# Patient Record
Sex: Male | Born: 1955 | Race: White | Hispanic: No | Smoking: Never smoker
Health system: Southern US, Community
[De-identification: ages and names within clinical notes are randomized; demographics above are authoritative.]

## PROBLEM LIST (undated history)

## (undated) DIAGNOSIS — E119 Type 2 diabetes mellitus without complications: Secondary | ICD-10-CM

## (undated) DIAGNOSIS — H35039 Hypertensive retinopathy, unspecified eye: Secondary | ICD-10-CM

## (undated) DIAGNOSIS — E11319 Type 2 diabetes mellitus with unspecified diabetic retinopathy without macular edema: Secondary | ICD-10-CM

## (undated) DIAGNOSIS — I10 Essential (primary) hypertension: Secondary | ICD-10-CM

## (undated) HISTORY — DX: Type 2 diabetes mellitus with unspecified diabetic retinopathy without macular edema: E11.319

## (undated) HISTORY — PX: CATARACT EXTRACTION: SUR2

## (undated) HISTORY — DX: Essential (primary) hypertension: I10

## (undated) HISTORY — PX: EYE SURGERY: SHX253

## (undated) HISTORY — DX: Type 2 diabetes mellitus without complications: E11.9

## (undated) HISTORY — DX: Hypertensive retinopathy, unspecified eye: H35.039

---

## 2000-10-21 ENCOUNTER — Encounter: Admission: RE | Admit: 2000-10-21 | Discharge: 2000-10-21 | Payer: Self-pay | Admitting: Family Medicine

## 2000-10-21 ENCOUNTER — Encounter: Payer: Self-pay | Admitting: Family Medicine

## 2005-09-19 ENCOUNTER — Emergency Department (HOSPITAL_COMMUNITY): Admission: EM | Admit: 2005-09-19 | Discharge: 2005-09-20 | Payer: Self-pay | Admitting: Emergency Medicine
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
## 2017-07-02 ENCOUNTER — Encounter: Payer: Self-pay | Admitting: "Endocrinology

## 2017-07-09 ENCOUNTER — Encounter: Payer: Self-pay | Admitting: "Endocrinology

## 2017-07-11 ENCOUNTER — Encounter: Payer: Self-pay | Admitting: "Endocrinology

## 2017-07-16 ENCOUNTER — Encounter: Payer: Self-pay | Admitting: "Endocrinology

## 2017-07-22 ENCOUNTER — Encounter: Payer: Self-pay | Admitting: "Endocrinology

## 2017-09-17 ENCOUNTER — Ambulatory Visit: Payer: Self-pay | Admitting: "Endocrinology

## 2017-10-02 LAB — CBC AND DIFFERENTIAL
HCT: 41 (ref 41–53)
Hemoglobin: 13.5 (ref 13.5–17.5)

## 2017-10-02 LAB — LIPID PANEL
Cholesterol: 198 (ref 0–200)
HDL: 35 (ref 35–70)
LDL Cholesterol: 114
TRIGLYCERIDES: 247 — AB (ref 40–160)

## 2017-10-02 LAB — HEMOGLOBIN A1C: Hemoglobin A1C: 13.8

## 2017-10-02 LAB — BASIC METABOLIC PANEL
BUN: 12 (ref 4–21)
CREATININE: 1.3 (ref <=1.3)

## 2017-10-02 LAB — TSH: TSH: 1.59 (ref <=5.90)

## 2017-10-02 LAB — VITAMIN D 25 HYDROXY (VIT D DEFICIENCY, FRACTURES): VIT D 25 HYDROXY: 32

## 2017-10-20 ENCOUNTER — Encounter: Payer: Self-pay | Admitting: "Endocrinology

## 2017-10-20 ENCOUNTER — Ambulatory Visit (INDEPENDENT_AMBULATORY_CARE_PROVIDER_SITE_OTHER): Payer: Medicare HMO | Admitting: "Endocrinology

## 2017-10-20 VITALS — BP 108/60 | HR 88 | Ht 73.0 in | Wt 185.8 lb

## 2017-10-20 DIAGNOSIS — I10 Essential (primary) hypertension: Secondary | ICD-10-CM | POA: Diagnosis not present

## 2017-10-20 DIAGNOSIS — E1165 Type 2 diabetes mellitus with hyperglycemia: Secondary | ICD-10-CM

## 2017-10-20 MED ORDER — ACCU-CHEK GUIDE W/DEVICE KIT: 1.0000 | PACK | 0 refills | Status: DC

## 2017-10-20 MED ORDER — GLUCOSE BLOOD VI STRP: ORAL_STRIP | 2 refills | Status: DC

## 2017-10-20 NOTE — Patient Instructions (Signed)

## 2017-10-20 NOTE — Progress Notes (Signed)
Endocrinology Consult Note       10/20/2017, 5:40 PM   Subjective:    Patient ID: Thomas Finley, male    DOB: Apr 04, 1955.  Thomas Finley is being seen in consultation for management of currently uncontrolled symptomatic diabetes requested by  Kennieth Rad, MD.   Past Medical History:  Diagnosis Date  . Diabetes ([place])   . Hypertension    History reviewed. No pertinent surgical history. Social History   Socioeconomic History  . Marital status: Single    Spouse name: Not on file  . Number of children: Not on file  . Years of education: Not on file  . Highest education level: Not on file  Occupational History  . Not on file  Social Needs  . Financial resource strain: Not on file  . Food insecurity:    Worry: Not on file    Inability: Not on file  . Transportation needs:    Medical: Not on file    Non-medical: Not on file  Tobacco Use  . Smoking status: Never Smoker  . Smokeless tobacco: Never Used  Substance and Sexual Activity  . Alcohol use: Not on file  . Drug use: Not on file  . Sexual activity: Not on file  Lifestyle  . Physical activity:    Days per week: Not on file    Minutes per session: Not on file  . Stress: Not on file  Relationships  . Social connections:    Talks on phone: Not on file    Gets together: Not on file    Attends religious service: Not on file    Active member of club or organization: Not on file    Attends meetings of clubs or organizations: Not on file    Relationship status: Not on file  Other Topics Concern  . Not on file  Social History Narrative  . Not on file   Outpatient Encounter Medications as of 10/20/2017  Medication Sig  . glipiZIDE (GLUCOTROL) 10 MG tablet Take 5 mg by mouth 2 (two) times daily with a meal.  . lisinopril (PRINIVIL,ZESTRIL) 2.5 MG tablet Take 2.5 mg by mouth daily.  . [DISCONTINUED] insulin lispro (HUMALOG) 100 UNIT/ML  injection Inject 25 Units into the skin daily.  . Blood Glucose Monitoring Suppl (ACCU-CHEK GUIDE) w/Device KIT 1 Piece by Does not apply route as directed.  Marland Kitchen glucose blood (ACCU-CHEK GUIDE) test strip Use as instructed   No facility-administered encounter medications on file as of 10/20/2017.     ALLERGIES: No Known Allergies  VACCINATION STATUS:  There is no immunization history on file for this patient.  Diabetes  He presents for his initial diabetic visit. He has type 2 diabetes mellitus. Onset time: He was diagnosed at approximate age of 62 years. His disease course has been worsening. There are no hypoglycemic associated symptoms. Pertinent negatives for hypoglycemia include no confusion, headaches, pallor or seizures. Associated symptoms include blurred vision, polydipsia, polyuria and weight loss. Pertinent negatives for diabetes include no chest pain, no fatigue, no polyphagia and no weakness. There are no hypoglycemic complications. Symptoms are worsening. Risk factors for coronary artery disease include  dyslipidemia, family history, diabetes mellitus, male sex and sedentary lifestyle. Current diabetic treatment includes insulin injections (He is getting glipizide 10 mg p.o. twice daily, Humalog 25 units nightly.). His weight is decreasing steadily. He is following a generally unhealthy diet. When asked about meal planning, he reported none. He has had a previous visit with a dietitian. He never participates in exercise. (He did not bring any meter no logs to review today.  He reports verbally that his recent labs show A1c of "high ".) An ACE inhibitor/angiotensin II receptor blocker is being taken.  Hypertension  This is a chronic problem. The problem is controlled. Associated symptoms include blurred vision. Pertinent negatives include no chest pain, headaches, neck pain, palpitations or shortness of breath. Risk factors for coronary artery disease include diabetes mellitus,  dyslipidemia, male gender and sedentary lifestyle. Past treatments include ACE inhibitors.  Hyperlipidemia  This is a chronic problem. Exacerbating diseases include diabetes. Pertinent negatives include no chest pain, myalgias or shortness of breath. Current antihyperlipidemic treatment includes statins. Risk factors for coronary artery disease include dyslipidemia, diabetes mellitus, hypertension, male sex and a sedentary lifestyle.      Review of Systems  Constitutional: Positive for weight loss. Negative for chills, fatigue, fever and unexpected weight change.  HENT: Negative for dental problem, mouth sores and trouble swallowing.   Eyes: Positive for blurred vision. Negative for visual disturbance.  Respiratory: Negative for cough, choking, chest tightness, shortness of breath and wheezing.   Cardiovascular: Negative for chest pain, palpitations and leg swelling.  Gastrointestinal: Negative for abdominal distention, abdominal pain, constipation, diarrhea, nausea and vomiting.  Endocrine: Positive for polydipsia and polyuria. Negative for polyphagia.  Genitourinary: Negative for dysuria, flank pain, hematuria and urgency.  Musculoskeletal: Negative for back pain, gait problem, myalgias and neck pain.  Skin: Negative for pallor, rash and wound.  Neurological: Negative for seizures, syncope, weakness, numbness and headaches.  Psychiatric/Behavioral: Negative for confusion and dysphoric mood.    Objective:    BP 108/60 (BP Location: Left Arm, Patient Position: Sitting)   Pulse 88   Ht 6' 1"  (1.854 m)   Wt 185 lb 12.8 oz (84.3 kg)   SpO2 98%   BMI 24.51 kg/m   Wt Readings from Last 3 Encounters:  10/20/17 185 lb 12.8 oz (84.3 kg)     Physical Exam  Constitutional: He is oriented to person, place, and time. He appears well-developed. He is cooperative. No distress.  Disheveled, poor  Hygiene generally  HENT:  Head: Normocephalic and atraumatic.  Eyes: EOM are normal.  Neck:  Normal range of motion. Neck supple. No tracheal deviation present. No thyromegaly present.  Cardiovascular: Normal rate, S1 normal, S2 normal and normal heart sounds. Exam reveals no gallop.  No murmur heard. Pulses:      Dorsalis pedis pulses are 1+ on the right side, and 1+ on the left side.       Posterior tibial pulses are 1+ on the right side, and 1+ on the left side.  Pulmonary/Chest: Breath sounds normal. No respiratory distress. He has no wheezes.  Abdominal: Soft. Bowel sounds are normal. He exhibits no distension. There is no tenderness. There is no guarding and no CVA tenderness.  Musculoskeletal: He exhibits no edema.       Right shoulder: He exhibits no swelling and no deformity.  Neurological: He is alert and oriented to person, place, and time. He has normal strength and normal reflexes. No cranial nerve deficit or sensory deficit. Gait normal.  Skin:  Skin is warm and dry. No rash noted. No cyanosis. Nails show no clubbing.  Psychiatric: His speech is normal. Judgment normal. Cognition and memory are normal.    Assessment & Plan:   1. Uncontrolled type 2 diabetes mellitus with hyperglycemia ([facility])  - Thomas Finley has currently uncontrolled symptomatic type  (?) DM since 62 years of age. -Patient came with no meter nor logs.  His recent labs are not reported, patient says he was told his A1c was " high". 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 These are all discussed in detail with the patient.  - I have counseled him on diet management by adopting a carbohydrate restricted/protein rich diet.  - Suggestion is made for him to avoid simple carbohydrates  from his diet including Cakes, Sweet Desserts, Ice Cream, Soda (diet and regular), Sweet Tea, Candies, Chips, Cookies, Store Bought Juices, Alcohol in Excess of   1-2 drinks a day, Artificial Sweeteners, and "Sugar-free" Products. This will help patient to have stable blood glucose profile and potentially avoid unintended weight gain.  - I encouraged him to switch to  unprocessed or minimally processed complex starch and increased protein intake (animal or plant source), fruits, and vegetables.  - he is advised to stick to a routine mealtimes to eat 3 meals  a day and avoid unnecessary snacks ( to snack only to correct hypoglycemia).   -He explains that he has met with various dietitian within the past and he would not consider another referral this time.  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   -#1 priority in managing his diabetes is to avoid hypoglycemia, especially inadvertent hypoglycemia without proper monitoring.   -I approached him to start monitoring blood glucose 4 times a day-before meals and at bedtime and return in 1 week with his meter and logs for reevaluation.  A new prescription for a meter and strips will be sent to his pharmacy. -Patient is encouraged to call clinic for blood glucose levels less than 70 or above 300 mg /dl. -We will request for his recent labs from Dr. Shearon Stalls office.  His A1c is high and returns with significant hyperglycemia, he will be considered for insulin treatment. -If he requires insulin treatment, he will be considered for basal insulin first before rapid acting insulin.  Hence, I advised him to hold his Humalog for now. -I advised him to lower his glipizide to 5 mg p.o. twice daily-with breakfast and supper.   -He is not a suitable candidate for incretin therapy, SGLT2 inhibitors therapy, nor metformin therapy.  - Patient specific target  A1c;  LDL, HDL, Triglycerides, and  Waist Circumference were discussed in detail.  2) BP/HTN: His blood pressure is controlled to  target.  He is advised to continue low-dose lisinopril at 2.5 mg p.o. daily.    3) Lipids/HPL:   His recent lipid panel not reported.  He is advised to continue atorvastatin 10 mg p.o. nightly.   4)  Weight/Diet: He declined another CDE referral, exercise, and detailed carbohydrates information provided.  If he loses weight unintentionally, he will be considered for Creon therapy.  5) Chronic Care/Health Maintenance:  -he  Is on ACEI/ARB and Statin medications and  is encouraged  to initiate and continue to follow up with Ophthalmology, Dentist,  Podiatrist at least yearly or according to recommendations, and advised to  stay away from smoking alcohol. I have recommended yearly flu vaccine and pneumonia vaccine at least every 5 years; moderate intensity exercise for up to 150 minutes weekly; and  sleep for at least 7 hours a day.  - I advised patient to maintain close follow up with Kennieth Rad, MD for primary care needs.  - Time spent with the patient: 45 minutes, of which >50% was spent in obtaining information about his symptoms, reviewing his previous labs, evaluations, and treatments, counseling him about his uncontrolled diabetes, hypertension, hyperlipidemia, and developing developing  plans for long term treatment based on the latest recommendations.  Thomas Finley participated in the discussions, expressed understanding, and voiced agreement with the above plans.  All questions were answered to his satisfaction. he is encouraged to contact clinic should he have any questions or concerns prior to his return visit.  Follow up plan: - Return in about 1 week (around 10/27/2017), or latest labs from Dr. Shearon Stalls office, for meter, and logs, follow up with pre-visit labs, meter, and logs.  Glade Lloyd, MD [facility] [address] [place], [place] [address] Phone: [phone]  Fax: [phone]    10/20/2017, 5:40 PM  This note was  partially dictated with voice recognition software. Similar sounding words can be transcribed inadequately or may not  be corrected upon review.

## 2017-10-28 ENCOUNTER — Encounter: Payer: Self-pay | Admitting: "Endocrinology

## 2017-10-28 ENCOUNTER — Ambulatory Visit (INDEPENDENT_AMBULATORY_CARE_PROVIDER_SITE_OTHER): Payer: Medicare HMO | Admitting: "Endocrinology

## 2017-10-28 VITALS — BP 126/85 | HR 96 | Ht 73.0 in | Wt 190.0 lb

## 2017-10-28 DIAGNOSIS — N183 Chronic kidney disease, stage 3 (moderate): Secondary | ICD-10-CM

## 2017-10-28 DIAGNOSIS — Z9119 Patient's noncompliance with other medical treatment and regimen: Secondary | ICD-10-CM

## 2017-10-28 DIAGNOSIS — Z91199 Patient's noncompliance with other medical treatment and regimen due to unspecified reason: Secondary | ICD-10-CM | POA: Insufficient documentation

## 2017-10-28 DIAGNOSIS — I10 Essential (primary) hypertension: Secondary | ICD-10-CM

## 2017-10-28 DIAGNOSIS — E1122 Type 2 diabetes mellitus with diabetic chronic kidney disease: Secondary | ICD-10-CM | POA: Diagnosis not present

## 2017-10-28 MED ORDER — LINAGLIPTIN 5 MG PO TABS: 5.0000 mg | ORAL_TABLET | Freq: Every day | ORAL | 3 refills | Status: DC

## 2017-10-28 NOTE — Patient Instructions (Signed)

## 2017-10-28 NOTE — Progress Notes (Signed)
Endocrinology follow-up  Note       10/28/2017, 1:26 PM   Subjective:    Patient ID: Thomas Finley, male    DOB: 18-Jul-1955.  Thomas Finley is being seen in follow-up for management of currently uncontrolled symptomatic diabetes requested by  Thomas Rad, MD.   Past Medical History:  Diagnosis Date  . Diabetes ([place])   . Hypertension    History reviewed. No pertinent surgical history. Social History   Socioeconomic History  . Marital status: Single    Spouse name: Not on file  . Number of children: Not on file  . Years of education: Not on file  . Highest education level: Not on file  Occupational History  . Not on file  Social Needs  . Financial resource strain: Not on file  . Food insecurity:    Worry: Not on file    Inability: Not on file  . Transportation needs:    Medical: Not on file    Non-medical: Not on file  Tobacco Use  . Smoking status: Never Smoker  . Smokeless tobacco: Never Used  Substance and Sexual Activity  . Alcohol use: Not on file  . Drug use: Not on file  . Sexual activity: Not on file  Lifestyle  . Physical activity:    Days per week: Not on file    Minutes per session: Not on file  . Stress: Not on file  Relationships  . Social connections:    Talks on phone: Not on file    Gets together: Not on file    Attends religious service: Not on file    Active member of club or organization: Not on file    Attends meetings of clubs or organizations: Not on file    Relationship status: Not on file  Other Topics Concern  . Not on file  Social History Narrative  . Not on file   Outpatient Encounter Medications as of 10/28/2017  Medication Sig  . atorvastatin (LIPITOR) 10 MG tablet Take 10 mg by mouth daily.  . Blood Glucose Monitoring Suppl (ACCU-CHEK GUIDE) w/Device KIT 1 Piece by Does not apply route as directed.  Marland Kitchen glipiZIDE (GLUCOTROL) 10 MG tablet Take 5 mg by  mouth 2 (two) times daily with a meal.  . glucose blood (ACCU-CHEK GUIDE) test strip Use as instructed  . linagliptin (TRADJENTA) 5 MG TABS tablet Take 1 tablet (5 mg total) by mouth daily.  Marland Kitchen lisinopril (PRINIVIL,ZESTRIL) 2.5 MG tablet Take 2.5 mg by mouth daily.   No facility-administered encounter medications on file as of 10/28/2017.     ALLERGIES: No Known Allergies  VACCINATION STATUS:  There is no immunization history on file for this patient.  Diabetes  He presents for his follow-up diabetic visit. He has type 2 diabetes mellitus. Onset time: He was diagnosed at approximate age of 38 years. His disease course has been worsening. There are no hypoglycemic associated symptoms. Pertinent negatives for hypoglycemia include no confusion, headaches, pallor or seizures. Associated symptoms include blurred vision, polydipsia, polyuria and weight loss. Pertinent negatives for diabetes include no chest pain, no fatigue, no polyphagia and no weakness.  There are no hypoglycemic complications. Symptoms are worsening. Risk factors for coronary artery disease include dyslipidemia, family history, diabetes mellitus, male sex and sedentary lifestyle. Current diabetic treatment includes insulin injections (He is getting glipizide 10 mg p.o. twice daily, Humalog 25 units nightly.). His weight is increasing steadily. He is following a generally unhealthy diet. When asked about meal planning, he reported none. He has had a previous visit with a dietitian. He never participates in exercise. (He returns with no meter nor logs.  He does not even go to the pharmacy to get his supplies.  He is A1c from his PMD office is 13.8%.   ) An ACE inhibitor/angiotensin II receptor blocker is being taken.  Hypertension  This is a chronic problem. The problem is controlled. Associated symptoms include blurred vision. Pertinent negatives include no chest pain, headaches, neck pain, palpitations or shortness of breath. Risk  factors for coronary artery disease include diabetes mellitus, dyslipidemia, male gender and sedentary lifestyle. Past treatments include ACE inhibitors.  Hyperlipidemia  This is a chronic problem. Exacerbating diseases include diabetes. Pertinent negatives include no chest pain, myalgias or shortness of breath. Current antihyperlipidemic treatment includes statins. Risk factors for coronary artery disease include dyslipidemia, diabetes mellitus, hypertension, male sex and a sedentary lifestyle.      Review of Systems  Constitutional: Positive for weight loss. Negative for chills, fatigue, fever and unexpected weight change.  HENT: Negative for dental problem, mouth sores and trouble swallowing.   Eyes: Positive for blurred vision. Negative for visual disturbance.  Respiratory: Negative for cough, choking, chest tightness, shortness of breath and wheezing.   Cardiovascular: Negative for chest pain, palpitations and leg swelling.  Gastrointestinal: Negative for abdominal distention, abdominal pain, constipation, diarrhea, nausea and vomiting.  Endocrine: Positive for polydipsia and polyuria. Negative for polyphagia.  Genitourinary: Negative for dysuria, flank pain, hematuria and urgency.  Musculoskeletal: Negative for back pain, gait problem, myalgias and neck pain.  Skin: Negative for pallor, rash and wound.  Neurological: Negative for seizures, syncope, weakness, numbness and headaches.  Psychiatric/Behavioral: Negative for confusion and dysphoric mood.    Objective:    BP 126/85   Pulse 96   Ht 6' 1"  (1.854 m)   Wt 190 lb (86.2 kg)   BMI 25.07 kg/m   Wt Readings from Last 3 Encounters:  10/28/17 190 lb (86.2 kg)  10/20/17 185 lb 12.8 oz (84.3 kg)     Physical Exam  Constitutional: He is oriented to person, place, and time. He appears well-developed. He is cooperative. No distress.  Disheveled, poor  Hygiene generally  HENT:  Head: Normocephalic and atraumatic.  Eyes: EOM are  normal.  Neck: Normal range of motion. Neck supple. No tracheal deviation present. No thyromegaly present.  Cardiovascular: Normal rate, S1 normal, S2 normal and normal heart sounds. Exam reveals no gallop.  No murmur heard. Pulses:      Dorsalis pedis pulses are 1+ on the right side, and 1+ on the left side.       Posterior tibial pulses are 1+ on the right side, and 1+ on the left side.  Pulmonary/Chest: Breath sounds normal. No respiratory distress. He has no wheezes.  Abdominal: Soft. Bowel sounds are normal. He exhibits no distension. There is no tenderness. There is no guarding and no CVA tenderness.  Musculoskeletal: He exhibits no edema.       Right shoulder: He exhibits no swelling and no deformity.  Neurological: He is alert and oriented to person, place, and time. He  has normal strength and normal reflexes. No cranial nerve deficit or sensory deficit. Gait normal.  Skin: Skin is warm and dry. No rash noted. No cyanosis. Nails show no clubbing.  Psychiatric: His speech is normal. Judgment normal. Cognition and memory are normal.  Disheveled, reluctant and unconcerned affect.   Recent Results (from the past 2160 hour(s))  CBC and differential     Status: None   Collection Time: 10/02/17 12:00 AM  Result Value Ref Range   Hemoglobin 13.5 13.5 - 17.5   HCT 41 41 - 53  VITAMIN D 25 Hydroxy (Vit-D Deficiency, Fractures)     Status: None   Collection Time: 10/02/17 12:00 AM  Result Value Ref Range   Vit D, 25-Hydroxy 32   Basic metabolic panel     Status: None   Collection Time: 10/02/17 12:00 AM  Result Value Ref Range   BUN 12 4 - 21   Creatinine 1.3 0.6 - 1.3  Lipid panel     Status: Abnormal   Collection Time: 10/02/17 12:00 AM  Result Value Ref Range   Triglycerides 247 (A) 40 - 160   Cholesterol 198 0 - 200   HDL 35 35 - 70   LDL Cholesterol 114   Hemoglobin A1c     Status: None   Collection Time: 10/02/17 12:00 AM  Result Value Ref Range   Hemoglobin A1C 13.8    TSH     Status: None   Collection Time: 10/02/17 12:00 AM  Result Value Ref Range   TSH 1.59 0.41 - 5.90     Assessment & Plan:   1. Uncontrolled type 2 diabetes mellitus with hyperglycemia (HCC)  - Armistead Sult has currently uncontrolled symptomatic type  (?) DM since 61 years of age. -He returns with no meter nor logs.  His labs from July 11 show A1c of 13.8% , patient is minimally symptomatic. -his diabetes is complicated by past heavy alcohol use, sedentary life and Brentton Wardlow remains at a high risk for more acute and chronic complications which include CAD, CVA, CKD, retinopathy, and neuropathy. These are all discussed in detail with the patient.  - I have counseled him on diet management by adopting a carbohydrate restricted/protein rich diet.  -  Suggestion is made for him to avoid simple carbohydrates  from his diet including Cakes, Sweet Desserts / Pastries, Ice Cream, Soda (diet and regular), Sweet Tea, Candies, Chips, Cookies, Store Bought Juices, Alcohol in Excess of  1-2 drinks a day, Artificial Sweeteners, and "Sugar-free" Products. This will help patient to have stable blood glucose profile and potentially avoid unintended weight gain.   - I encouraged him to switch to  unprocessed or minimally processed complex starch and increased protein intake (animal or plant source), fruits, and vegetables.  - he is advised to stick to a routine mealtimes to eat 3 meals  a day and avoid unnecessary snacks ( to snack only to correct hypoglycemia).   -He explains that he has met with various dietitian within the past and he would not consider another referral this time.  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   -#1 priority in managing his diabetes is to avoid hypoglycemia, especially inadvertent hypoglycemia without proper monitoring.   He is not committed  for safe use of insulin at this time. -Once his commitment to monitor is assured, he  will be considered for insulin treatment.   -Once again, I approached him to start monitoring blood glucose 2 times a day-before breakfast and at bedtime and return in 9 weeks with his meter and logs.    -In the meantime I advised him to continue glipizide 5 mg with breakfast and 5 mg with supper.  I discussed and added Tradjenta 5 mg p.o. daily with breakfast.    -He is encouraged to call clinic for blood glucose levels less than 70 or above 300 mg /dl.  -I advised him to lower his glipizide to 5 mg p.o. twice daily-with breakfast and supper.   -He is not a suitable candidate for incretin therapy, SGLT2 inhibitors therapy, nor metformin therapy.  - Patient specific target  A1c;  LDL, HDL, Triglycerides, and  Waist Circumference were discussed in detail.  2) BP/HTN: His blood pressure is controlled to target.    He is advised to continue low-dose lisinopril at 2.5 mg p.o. daily.    3) Lipids/HPL:   His recent lipid panel not reported.  He is advised to continue atorvastatin 10 mg p.o. nightly.   4)  Weight/Diet: He declined another CDE referral, exercise, and detailed carbohydrates information provided.  If he loses weight unintentionally, he will be considered for Creon therapy.  5) Chronic Care/Health Maintenance:  -he  Is on ACEI/ARB and Statin medications and  is encouraged to initiate and continue to follow up with Ophthalmology, Dentist,  Podiatrist at least yearly or according to recommendations, and advised to  stay away from smoking alcohol. I have recommended yearly flu vaccine and pneumonia vaccine at least every 5 years; moderate intensity exercise for up to 150 minutes weekly; and  sleep for at least 7 hours a day.  - I advised patient to maintain close follow up with Thomas Rad, MD for primary care needs.  - Time spent with the patient: 25 min, of which >50% was spent  in reviewing his blood glucose logs , discussing his hypo- and hyper-glycemic episodes, reviewing his current and  previous labs and insulin doses and developing a plan to avoid hypo- and hyper-glycemia. Please refer to Patient Instructions for Blood Glucose Monitoring and Insulin/Medications Dosing Guide"  in media tab for additional information. [place] participated in the discussions, expressed understanding, and voiced agreement with the above plans.  All questions were answered to his satisfaction. he is encouraged to contact clinic should he have any questions or concerns prior to his return visit.  Follow up plan: - Return in about 2 months (around 12/30/2017) for Meter, and Logs.  Glade Lloyd, MD [facility] [address] [place], [place] [address] Phone: [phone]  Fax: [phone]    10/28/2017, 1:26 PM  This note was partially dictated with voice recognition software. Similar sounding words can be transcribed inadequately or may not  be corrected upon review.

## 2017-12-29 ENCOUNTER — Ambulatory Visit: Payer: Medicare HMO | Admitting: "Endocrinology

## 2017-12-30 ENCOUNTER — Other Ambulatory Visit: Payer: Self-pay

## 2017-12-30 MED ORDER — BLOOD GLUCOSE MONITOR KIT: PACK | 0 refills | Status: DC

## 2018-01-02 ENCOUNTER — Other Ambulatory Visit: Payer: Self-pay

## 2018-01-07 ENCOUNTER — Telehealth: Payer: Self-pay | Admitting: "Endocrinology

## 2018-01-07 NOTE — Telephone Encounter (Signed)
Pt is not on Insulin. Called pt. Left message for him to call us back.

## 2018-01-07 NOTE — Telephone Encounter (Signed)
Thomas Finley is asking for a refill on his Pen Needles he is asking for them to be sent to [facility] in [place], Please advise?

## 2018-01-08 ENCOUNTER — Telehealth: Payer: Self-pay

## 2018-01-08 NOTE — Telephone Encounter (Signed)
error 

## 2018-01-12 ENCOUNTER — Other Ambulatory Visit: Payer: Self-pay | Admitting: "Endocrinology

## 2018-01-13 LAB — HGB A1C W/O EAG: Hgb A1c MFr Bld: 12 % — ABNORMAL HIGH (ref 4.8–5.6)

## 2018-01-13 LAB — COMPREHENSIVE METABOLIC PANEL
ALBUMIN: 4.6 g/dL (ref 3.6–4.8)
ALT: 13 IU/L (ref 0–44)
AST: 18 IU/L (ref 0–40)
Albumin/Globulin Ratio: 1.3 (ref 1.2–2.2)
Alkaline Phosphatase: 112 IU/L (ref 39–117)
BUN / CREAT RATIO: 17 (ref 10–24)
BUN: 19 mg/dL (ref 8–27)
Bilirubin Total: 0.3 mg/dL (ref 0.0–1.2)
CO2: 24 mmol/L (ref 20–29)
CREATININE: 1.1 mg/dL (ref 0.76–1.27)
Calcium: 10.1 mg/dL (ref 8.6–10.2)
Chloride: 98 mmol/L (ref 96–106)
GFR, EST AFRICAN AMERICAN: 83 mL/min/{1.73_m2} (ref >59)
GFR, EST NON AFRICAN AMERICAN: 72 mL/min/{1.73_m2} (ref >59)
GLOBULIN, TOTAL: 3.6 g/dL (ref 1.5–4.5)
GLUCOSE: 227 mg/dL — AB (ref 65–99)
Potassium: 4.5 mmol/L (ref 3.5–5.2)
SODIUM: 136 mmol/L (ref 134–144)
TOTAL PROTEIN: 8.2 g/dL (ref 6.0–8.5)

## 2018-01-22 ENCOUNTER — Encounter: Payer: Self-pay | Admitting: "Endocrinology

## 2018-01-22 ENCOUNTER — Ambulatory Visit (INDEPENDENT_AMBULATORY_CARE_PROVIDER_SITE_OTHER): Payer: Medicare HMO | Admitting: "Endocrinology

## 2018-01-22 ENCOUNTER — Other Ambulatory Visit: Payer: Self-pay

## 2018-01-22 VITALS — BP 128/71 | HR 74 | Ht 73.0 in | Wt 192.0 lb

## 2018-01-22 DIAGNOSIS — E1165 Type 2 diabetes mellitus with hyperglycemia: Secondary | ICD-10-CM | POA: Diagnosis not present

## 2018-01-22 DIAGNOSIS — I10 Essential (primary) hypertension: Secondary | ICD-10-CM

## 2018-01-22 MED ORDER — INSULIN PEN NEEDLE 32G X 4 MM MISC: 1.0000 | Freq: Every day | 2 refills | Status: DC

## 2018-01-22 MED ORDER — GLUCOSE BLOOD VI STRP: 1.0000 | ORAL_STRIP | 5 refills | Status: DC | PRN

## 2018-01-22 MED ORDER — INSULIN DEGLUDEC 100 UNIT/ML ~~LOC~~ SOPN: 20.0000 [IU] | PEN_INJECTOR | Freq: Every day | SUBCUTANEOUS | 2 refills | Status: DC

## 2018-01-22 MED ORDER — INSULIN PEN NEEDLE 32G X 4 MM MISC: 1.0000 | Freq: Four times a day (QID) | 2 refills | Status: DC

## 2018-01-22 NOTE — Progress Notes (Signed)
Endocrinology follow-up  Note       01/22/2018, 9:29 AM   Subjective:    Patient ID: Thomas Finley, male    DOB: 15-Dec-1955.  Thomas Finley is being seen in follow-up for management of currently uncontrolled symptomatic type 2 diabetes, hyperlipidemia, hypertension. PMD:   Kennieth Rad, MD.   Past Medical History:  Diagnosis Date  . Diabetes ([place])   . Hypertension    History reviewed. No pertinent surgical history. Social History   Socioeconomic History  . Marital status: Single    Spouse name: Not on file  . Number of children: Not on file  . Years of education: Not on file  . Highest education level: Not on file  Occupational History  . Not on file  Social Needs  . Financial resource strain: Not on file  . Food insecurity:    Worry: Not on file    Inability: Not on file  . Transportation needs:    Medical: Not on file    Non-medical: Not on file  Tobacco Use  . Smoking status: Never Smoker  . Smokeless tobacco: Never Used  Substance and Sexual Activity  . Alcohol use: Never    Frequency: Never  . Drug use: Never  . Sexual activity: Not on file  Lifestyle  . Physical activity:    Days per week: Not on file    Minutes per session: Not on file  . Stress: Not on file  Relationships  . Social connections:    Talks on phone: Not on file    Gets together: Not on file    Attends religious service: Not on file    Active member of club or organization: Not on file    Attends meetings of clubs or organizations: Not on file    Relationship status: Not on file  Other Topics Concern  . Not on file  Social History Narrative  . Not on file   Outpatient Encounter Medications as of 01/22/2018  Medication Sig  . [DISCONTINUED] Insulin Aspart (NOVOLOG FLEXPEN [place]) Inject 20 Units into the skin.  Marland Kitchen atorvastatin (LIPITOR) 10 MG tablet Take 10 mg by mouth daily.  . blood glucose meter kit and  supplies KIT Dispense based on patient and insurance preference. Use up to four times daily as directed. (FOR ICD-10 E11.65).  . Blood Glucose Monitoring Suppl (ACCU-CHEK GUIDE) w/Device KIT 1 Piece by Does not apply route as directed.  . doxycycline (ADOXA) 100 MG tablet Take 100 mg by mouth 2 (two) times daily. for 10 days  . FLUoxetine (PROZAC) 20 MG tablet daily.  Marland Kitchen glipiZIDE (GLUCOTROL) 10 MG tablet Take 5 mg by mouth 2 (two) times daily with a meal.  . glucose blood (ACCU-CHEK GUIDE) test strip Use as instructed  . insulin degludec (TRESIBA FLEXTOUCH) 100 UNIT/ML SOPN FlexTouch Pen Inject 0.2 mLs (20 Units total) into the skin daily.  . Insulin Pen Needle (BD PEN NEEDLE NANO U/F) 32G X 4 MM MISC 1 each by Does not apply route 4 (four) times daily.  Marland Kitchen linagliptin (TRADJENTA) 5 MG TABS tablet Take 1 tablet (5 mg total) by mouth daily.  Marland Kitchen  lisinopril (PRINIVIL,ZESTRIL) 2.5 MG tablet Take 2.5 mg by mouth daily.  . [DISCONTINUED] Insulin Pen Needle (PEN NEEDLES) 31G X 5 MM MISC by Does not apply route.   No facility-administered encounter medications on file as of 01/22/2018.     ALLERGIES: No Known Allergies  VACCINATION STATUS:  There is no immunization history on file for this patient.  Diabetes  He presents for his follow-up diabetic visit. He has type 2 diabetes mellitus. Onset time: He was diagnosed at approximate age of 73 years. His disease course has been worsening. There are no hypoglycemic associated symptoms. Pertinent negatives for hypoglycemia include no confusion, headaches, pallor or seizures. Associated symptoms include blurred vision, polydipsia, polyuria and weight loss. Pertinent negatives for diabetes include no chest pain, no fatigue, no polyphagia and no weakness. There are no hypoglycemic complications. Symptoms are worsening. Risk factors for coronary artery disease include dyslipidemia, family history, diabetes mellitus, male sex and sedentary lifestyle. Current  diabetic treatment includes insulin injections (He is getting glipizide 10 mg p.o. twice daily, Humalog 25 units nightly.). He is compliant with treatment some of the time. His weight is increasing steadily. He is following a generally unhealthy diet. When asked about meal planning, he reported none. He has had a previous visit with a dietitian. He never participates in exercise. His breakfast blood glucose range is generally >200 mg/dl. His lunch blood glucose range is generally >200 mg/dl. His dinner blood glucose range is generally >200 mg/dl. His overall blood glucose range is >200 mg/dl. (He returns with no meter nor logs.  He does not even go to the pharmacy to get his supplies.  He is A1c from his PMD office is 13.8%.   ) An ACE inhibitor/angiotensin II receptor blocker is being taken.  Hypertension  This is a chronic problem. The problem is controlled. Associated symptoms include blurred vision. Pertinent negatives include no chest pain, headaches, neck pain, palpitations or shortness of breath. Risk factors for coronary artery disease include diabetes mellitus, dyslipidemia, male gender and sedentary lifestyle. Past treatments include ACE inhibitors.  Hyperlipidemia  This is a chronic problem. Exacerbating diseases include diabetes. Pertinent negatives include no chest pain, myalgias or shortness of breath. Current antihyperlipidemic treatment includes statins. Risk factors for coronary artery disease include dyslipidemia, diabetes mellitus, hypertension, male sex and a sedentary lifestyle.    Review of Systems  Constitutional: Positive for weight loss. Negative for chills, fatigue, fever and unexpected weight change.  HENT: Negative for dental problem, mouth sores and trouble swallowing.   Eyes: Positive for blurred vision. Negative for visual disturbance.  Respiratory: Negative for cough, choking, chest tightness, shortness of breath and wheezing.   Cardiovascular: Negative for chest pain,  palpitations and leg swelling.  Gastrointestinal: Negative for abdominal distention, abdominal pain, constipation, diarrhea, nausea and vomiting.  Endocrine: Positive for polydipsia and polyuria. Negative for polyphagia.  Genitourinary: Negative for dysuria, flank pain, hematuria and urgency.  Musculoskeletal: Negative for back pain, gait problem, myalgias and neck pain.  Skin: Negative for pallor, rash and wound.  Neurological: Negative for seizures, syncope, weakness, numbness and headaches.  Psychiatric/Behavioral: Negative for confusion and dysphoric mood.    Objective:    BP 128/71   Pulse 74   Ht _0  (1.854 m)   Wt 192 lb (87.1 kg)   BMI 25.33 kg/m   Wt Readings from Last 3 Encounters:  01/22/18 192 lb (87.1 kg)  10/28/17 190 lb (86.2 kg)  10/20/17 185 lb 12.8 oz (84.3 kg)  Physical Exam  Constitutional: He is oriented to person, place, and time. He appears well-developed. He is cooperative. No distress.  Disheveled, poor  Hygiene generally  HENT:  Head: Normocephalic and atraumatic.  Eyes: EOM are normal.  Neck: Normal range of motion. Neck supple. No tracheal deviation present. No thyromegaly present.  Cardiovascular: Normal rate, S1 normal, S2 normal and normal heart sounds. Exam reveals no gallop.  No murmur heard. Pulses:      Dorsalis pedis pulses are 1+ on the right side, and 1+ on the left side.       Posterior tibial pulses are 1+ on the right side, and 1+ on the left side.  Pulmonary/Chest: Breath sounds normal. No respiratory distress. He has no wheezes.  Abdominal: Soft. Bowel sounds are normal. He exhibits no distension. There is no tenderness. There is no guarding and no CVA tenderness.  Musculoskeletal: He exhibits no edema.       Right shoulder: He exhibits no swelling and no deformity.  Neurological: He is alert and oriented to person, place, and time. He has normal strength and normal reflexes. No cranial nerve deficit or sensory deficit. Gait  normal.  Skin: Skin is warm and dry. No rash noted. No cyanosis. Nails show no clubbing.  Psychiatric: His speech is normal. Judgment normal. Cognition and memory are normal.  Disheveled, reluctant and unconcerned affect.   Recent Results (from the past 2160 hour(s))  Comprehensive metabolic panel     Status: Abnormal   Collection Time: 01/12/18 12:26 PM  Result Value Ref Range   Glucose 227 (H) 65 - 99 mg/dL   BUN 19 8 - 27 mg/dL   Creatinine, Ser 1.10 0.76 - 1.27 mg/dL   GFR calc non Af Amer 72 >59 mL/min/1.73   GFR calc Af Amer 83 >59 mL/min/1.73   BUN/Creatinine Ratio 17 10 - 24   Sodium 136 134 - 144 mmol/L   Potassium 4.5 3.5 - 5.2 mmol/L   Chloride 98 96 - 106 mmol/L   CO2 24 20 - 29 mmol/L   Calcium 10.1 8.6 - 10.2 mg/dL   Total Protein 8.2 6.0 - 8.5 g/dL   Albumin 4.6 3.6 - 4.8 g/dL   Globulin, Total 3.6 1.5 - 4.5 g/dL   Albumin/Globulin Ratio 1.3 1.2 - 2.2   Bilirubin Total 0.3 0.0 - 1.2 mg/dL   Alkaline Phosphatase 112 39 - 117 IU/L   AST 18 0 - 40 IU/L   ALT 13 0 - 44 IU/L  Hgb A1c w/o eAG     Status: Abnormal   Collection Time: 01/12/18 12:26 PM  Result Value Ref Range   Hgb A1c MFr Bld 12.0 (H) 4.8 - 5.6 %    Comment:          Prediabetes: 5.7 - 6.4          Diabetes: >6.4          Glycemic control for adults with diabetes: <7.0      Assessment & Plan:   1. Uncontrolled type 2 diabetes mellitus with hyperglycemia ([facility])  - Deng Kemler has currently uncontrolled symptomatic type  (?) DM since 62 years of age. -He returns with his logs showing persistently above target glycemic profile averaging 200 mg/dL - 250 mg/dL.  His previsit labs show A1c of 12% slightly improving from 13.8% from July 2019.    -Remarkably, patient is only minimally symptomatic. -his diabetes is complicated by past heavy alcohol use, sedentary life and Mohannad Olivero remains at a  high risk for more acute and chronic complications which include CAD, CVA, CKD, retinopathy, and neuropathy.  These are all discussed in detail with the patient.  - I have counseled him on diet management by adopting a carbohydrate restricted/protein rich diet.  -  Suggestion is made for him to avoid simple carbohydrates  from his diet including Cakes, Sweet Desserts / Pastries, Ice Cream, Soda (diet and regular), Sweet Tea, Candies, Chips, Cookies, Store Bought Juices, Alcohol in Excess of  1-2 drinks a day, Artificial Sweeteners, and "Sugar-free" Products. This will help patient to have stable blood glucose profile and potentially avoid unintended weight gain.  - I encouraged him to switch to  unprocessed or minimally processed complex starch and increased protein intake (animal or plant source), fruits, and vegetables.  - he is advised to stick to a routine mealtimes to eat 3 meals  a day and avoid unnecessary snacks ( to snack only to correct hypoglycemia).   -He explains that he has met with various dietitian within the past and he would not consider another referral this time.  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   -He reports that he is taking NovoLog 20 units " daily", but he is not sure who prescribed this medication for him. -#1 priority in managing his diabetes is to avoid hypoglycemia, especially inadvertent hypoglycemia without proper monitoring.  -Since his monitoring blood glucose reasonably, he will benefit from insulin treatment but basal insulin before prandial insulin. -I discussed with him to discontinue NovoLog, and initiate Tresiba 20 units nightly. -He agrees to continue monitoring blood glucose 4 times a day- daily before meals and at bedtime.  -He is encouraged to call clinic for blood glucose levels less than 70 or greater than 3003.  -In the meantime I advised him to continue glipizide 5 mg with breakfast and 5 mg with supper,  and Tradjenta 5 mg p.o. daily with breakfast.    -Renal function is reasonable, however, he does not tolerate metformin therapy.  He is not a candidate for SGLT2 inhibitors nor incretin therapy.   - Patient specific target  A1c;  LDL, HDL, Triglycerides, and  Waist Circumference were discussed in detail.  2) BP/HTN: His blood pressure is controlled to target.   He is advised to continue low-dose lisinopril at 2.5 mg p.o. daily.    3) Lipids/HPL:   His recent lipid panel not reported.  He is advised to continue atorvastatin 10 mg p.o. nightly.   4)  Weight/Diet: He declined another CDE referral, exercise, and detailed carbohydrates information provided.  If he loses weight unintentionally, he will be considered for Creon therapy.  5) Chronic Care/Health Maintenance:  -he  Is on ACEI/ARB and Statin medications and  is encouraged to initiate and continue to follow up with Ophthalmology, Dentist,  Podiatrist at least yearly or according to recommendations, and advised to  stay away from smoking alcohol. I have recommended yearly flu vaccine and pneumonia vaccine at least every 5 years; moderate intensity exercise for up to 150 minutes weekly; and  sleep for at least 7 hours a day.  - I advised patient to maintain close follow up with Kennieth Rad, MD for primary care needs.  - Time spent with the patient: 25 min, of which >50% was spent in reviewing his blood glucose logs , discussing his hypo- and hyper-glycemic episodes, reviewing  his current and  previous labs and insulin doses and developing a plan to avoid hypo- and hyper-glycemia. Please refer to Patient Instructions for Blood Glucose Monitoring and Insulin/Medications Dosing Guide"  in media tab for additional information. Santa Genera participated in the discussions, expressed understanding, and voiced agreement with the above plans.  All questions were answered to his satisfaction. he is encouraged to contact clinic should he have any  questions or concerns prior to his return visit.  Follow up plan: - Return in about 3 months (around 04/24/2018) for Follow up with Pre-visit Labs, Meter, and Logs.  Glade Lloyd, MD [facility] [address] [place], [place] [address] Phone: [phone]  Fax: [phone]    01/22/2018, 9:29 AM  This note was partially dictated with voice recognition software. Similar sounding words can be transcribed inadequately or may not  be corrected upon review.

## 2018-04-24 ENCOUNTER — Ambulatory Visit: Payer: Medicare HMO | Admitting: "Endocrinology

## 2020-03-22 NOTE — Progress Notes (Signed)
[facility] Note  03/24/2020     CHIEF COMPLAINT Patient presents for Retina Evaluation   HISTORY OF PRESENT ILLNESS: Thomas Finley is a 64 y.o. male who presents to the clinic today for:   HPI    Retina Evaluation    I, the attending physician,  performed the HPI with the patient and updated documentation appropriately.          Comments    Patient referred by Dr. Herbert Deaner for retinal evaluation. Wants to rule out CRVO found on routine eye exam. Patient was unaware of any vision problems going into eye exam with Dr. Herbert Deaner. Patient has had diabetes for the past 9 years. Last a1c was around 10, about a month ago. Patient is also on HTN medication. No Rx eye gtts. Patient doesn't wear glasses for distance or reading.        Last edited by Bernarda Caffey, MD on 03/24/2020 11:29 AM. (History)    pt is here on the referral of Dr. Herbert Deaner for concern of DME OU, pts last A1c was 10.0 on 12.17.21, pt states his blood pressure has not been that high, pt states Dr. Herbert Deaner did his cataract sx and right after sx he could see pretty well, pt states he has not noticed a decrease in vision  Referring physician: Monna Fam, MD [place],  [place] [address]  HISTORICAL INFORMATION:   Selected notes from the MEDICAL RECORD NUMBER Referred by Dr. Monna Fam for concern of CRVO LEE:  Ocular Hx- PMH-    CURRENT MEDICATIONS: No current outpatient medications on file. (Ophthalmic Drugs)   No current facility-administered medications for this visit. (Ophthalmic Drugs)   Current Outpatient Medications (Other)  Medication Sig  . atorvastatin (LIPITOR) 10 MG tablet Take 10 mg by mouth daily.  . blood glucose meter kit and supplies KIT Dispense based on patient and insurance preference. Use up to four times daily as directed. (FOR ICD-10 E11.65).  . Blood Glucose Monitoring Suppl (ACCU-CHEK GUIDE) w/Device KIT 1 Piece by Does not apply route as  directed.  . doxycycline (ADOXA) 100 MG tablet Take 100 mg by mouth 2 (two) times daily. for 10 days  . Dulaglutide (TRULICITY) 1.5 AJ/2.8NO SOPN Inject into the skin once a week.  Marland Kitchen FLUoxetine (PROZAC) 20 MG tablet daily.  Marland Kitchen glucose blood (ACCU-CHEK GUIDE) test strip Use as instructed  . glucose blood test strip 1 each by Other route as needed. Use as instructed 4 x daily. E11.65. One touch ultra  . insulin degludec (TRESIBA FLEXTOUCH) 100 UNIT/ML SOPN FlexTouch Pen Inject 0.2 mLs (20 Units total) into the skin daily.  . Insulin Pen Needle (BD PEN NEEDLE NANO U/F) 32G X 4 MM MISC 1 each by Does not apply route at bedtime.  Marland Kitchen linagliptin (TRADJENTA) 5 MG TABS tablet Take 1 tablet (5 mg total) by mouth daily.  Marland Kitchen lisinopril (PRINIVIL,ZESTRIL) 2.5 MG tablet Take 2.5 mg by mouth daily.  Marland Kitchen glipiZIDE (GLUCOTROL) 10 MG tablet Take 5 mg by mouth 2 (two) times daily with a meal. (Patient not taking: Reported on 03/24/2020)   No current facility-administered medications for this visit. (Other)      REVIEW OF SYSTEMS: ROS    Positive for: Endocrine, Cardiovascular, Eyes   Negative for: Constitutional, Gastrointestinal, Neurological, Skin, Genitourinary, Musculoskeletal, HENT, Respiratory, Psychiatric, Allergic/Imm, Heme/Lymph   Last edited by Roselee Nova D, COT on 03/24/2020  8:04 AM. (History)       ALLERGIES  No Known Allergies  PAST MEDICAL HISTORY Past Medical History:  Diagnosis Date  . Diabetes ([place])   . Hypertension    Past Surgical History:  Procedure Laterality Date  . CATARACT EXTRACTION Bilateral    Dr. Herbert Deaner    FAMILY HISTORY Family History  Problem Relation Age of Onset  . Diabetes Father   . Diabetes Sister   . Diabetes Brother     SOCIAL HISTORY Social History   Tobacco Use  . Smoking status: Never Smoker  . Smokeless tobacco: Never Used  Vaping Use  . Vaping Use: Never used  Substance Use Topics  . Alcohol use: Never  . Drug use: Never          OPHTHALMIC EXAM:  Base Eye Exam    Visual Acuity (Snellen - Linear)      Right Left   Dist [place] 20/40 -2 20/30   Dist ph [place] 20/25 -1 20/25 -2       Tonometry (Tonopen, 8:18 AM)      Right Left   Pressure 10 10       Pupils      Dark Light Shape React APD   Right 3 2 Round Brisk None   Left 3 2 Round Brisk None       Visual Fields (Counting fingers)      Left Right    Full Full       Extraocular Movement      Right Left    Full, Ortho Full, Ortho       Neuro/Psych    Oriented x3: Yes   Mood/Affect: Normal       Dilation    Both eyes: 1.0% Mydriacyl, 2.5% Phenylephrine @ 8:18 AM        Slit Lamp and Fundus Exam    Slit Lamp Exam      Right Left   Lids/Lashes Dermatochalasis - upper lid Dermatochalasis - upper lid   Conjunctiva/Sclera White and quiet White and quiet   Cornea 2+ Punctate epithelial erosions, well healed temporal cataract wounds trace Punctate epithelial erosions, mild arcus, well healed temporal cataract wounds   Anterior Chamber deep, clear, narrow temporal angle Deep and quiet   Iris Round and dilated, No NVI Round and dilated, No NVI   Lens PC IOL in good position PC IOL in good position   Vitreous Vitreous syneresis Vitreous syneresis       Fundus Exam      Right Left   Disc Nasal elevation, blurred margin, +heme, fine vessels, ?early NVD Pink and Sharp   C/D Ratio 0.2 0.4   Macula Blunted foveal reflex, nasal edema/DBH Blunted foveal reflex, scattered IRH/cystic changes, +edema   Vessels attenuated, Tortuous, Copper wiring, AV crossing changes greatest along IT arcades attenuated, Tortuous, AV crossing changes   Periphery Attached, scattered IRH/DBH    Attached, scattered IRH/DBH           Refraction    Manifest Refraction      Sphere Cylinder Axis Dist VA   Right -0.50 +1.00 180 20/30   Left -0.50 +0.50 180 20/25          IMAGING AND PROCEDURES  Imaging and Procedures for 03/24/2020  OCT, Retina - OU - Both Eyes        Right Eye Quality was good. Central Foveal Thickness: 421. Progression has no prior data. Findings include abnormal foveal contour, intraretinal fluid, subretinal fluid, intraretinal hyper-reflective material (DME with BRVO component).   Left Eye Quality was  good. Central Foveal Thickness: 406. Progression has no prior data. Findings include abnormal foveal contour, intraretinal fluid, no SRF, vitreomacular adhesion .   Notes *Images captured and stored on drive  Diagnosis / Impression:  DME OU OD: +BRVO component  Clinical management:  See below  Abbreviations: NFP - Normal foveal profile. CME - cystoid macular edema. PED - pigment epithelial detachment. IRF - intraretinal fluid. SRF - subretinal fluid. EZ - ellipsoid zone. ERM - epiretinal membrane. ORA - outer retinal atrophy. ORT - outer retinal tubulation. SRHM - subretinal hyper-reflective material. IRHM - intraretinal hyper-reflective material        Intravitreal Injection, Pharmacologic Agent - OD - Right Eye       Time Out 03/24/2020. 9:36 AM. Confirmed correct patient, procedure, site, and patient consented.   Anesthesia Topical anesthesia was used. Anesthetic medications included Lidocaine 2%, Proparacaine 0.5%.   Procedure A supplied needle was used.   Injection:  1.25 mg Bevacizumab (AVASTIN) 1.4m/0.05mL SOLN   NDC: 509470-962-83 Lot: [id number] , Expiration date: 05/03/2020   Route: Intravitreal, Site: Right Eye, Waste: 0 mL  Post-op Post injection exam found visual acuity of at least counting fingers. The patient tolerated the procedure well. There were no complications. The patient received written and verbal post procedure care education.                 ASSESSMENT/PLAN:    ICD-10-CM   1. 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 Retinal edema  H35.81 OCT, Retina - OU - Both Eyes  3. Branch retinal vein occlusion of right eye with macular edema  H34.8310   4. Essential hypertension  I10   5. Hypertensive retinopathy of both eyes  H35.033   6. Pseudophakia, both eyes  Z96.1   7. 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 Moderate non-proliferative diabetic retinopathy, OU (OD>OS) - The incidence, risk factors for progression, natural history and treatment options for diabetic retinopathy were discussed with patient.   - The need for close monitoring of blood glucose, blood pressure, and serum lipids, avoiding cigarette or any type of tobacco, and the need for long term follow up was also discussed with patient. - exam shows scattered MA/DBH OU, central edema OU - OCT shows diabetic macular edema, OU (OD > OS) -- OD compounded by BRVO component (see below) - The natural history, pathology, and characteristics of diabetic macular edema discussed with patient.  A generalized discussion of the major clinical trials concerning treatment of diabetic macular edema (ETDRS, DCT, SCORE, RISE / RIDE, and ongoing DRCR net studies) was completed.  This discussion included mention of the various approaches to treating diabetic macular edema (observation, laser photocoagulation, anti-VEGF injections with lucentis / Avastin / Eylea, steroid injections with Kenalog / Ozurdex, and intraocular surgery with vitrectomy).  The goal hemoglobin A1C of 6-7 was discussed, as well as importance of smoking cessation and hypertension control.  Need for ongoing treatment and monitoring were specifically discussed with reference to chronic nature of diabetic macular edema. - recommend IVA OU, but OD #1 first today, 12.31.21 - pt wishes to proceed with injection  -  RBA of procedure discussed, questions answered  -  Avastin informed consent obtained and signed (OU) - see procedure note - f/u in 4 days for IVA OS #1  3. BRVO w/ CME OD - The natural history of retinal vein occlusion and macular edema and treatment options including observation, laser photocoagulation, and intravitreal antiVEGF injection with Avastin and Lucentis and Eylea and intravitreal injection of steroids with triamcinolone and Ozurdex and the complications of these procedures including loss of vision, infection, cataract, glaucoma, and retinal detachment were discussed with patient. - Specifically discussed findings from [place] / [place] study regarding patient stabilization with anti-VEGF agents and increased potential for visual improvements.  Also discussed need for frequent follow up and potentially multiple injections given the chronic nature of the disease process - BCVA 20/25 - OCT shows +DME with BRVO component - recommend IVA OD #1 today, 12.31.21 as above - F/U 4 weeks -- DFE/OCT/possible injection  4,5. Hypertensive retinopathy OU - discussed importance of tight BP control - monitor  6. Pseudophakia OU  - s/p CE/IOL OU (Dr. Herbert Deaner)  - IOLs in good position, doing well - monitor     Ophthalmic Meds Ordered this visit:  Meds ordered this encounter  Medications  . Bevacizumab (AVASTIN) SOLN 1.25 mg       Return in about 4 days (around 03/28/2020) for DME OU - OCT, Possible Injxn OS.  There are no Patient Instructions on file for this visit.   Explained the diagnoses, plan, and follow up with the patient and they expressed understanding.  Patient expressed understanding of the importance of proper follow up care.   This document serves as a record of services personally performed by Gardiner Sleeper, MD, PhD. It was created on their behalf by San Jetty. Owens Shark, OA an ophthalmic technician. The creation of this record is the provider's dictation and/or activities during  the visit.    Electronically signed by: San Jetty. Owens Shark, [place] 12.29.2021 11:34 AM  Gardiner Sleeper, M.D., Ph.D. Diseases & Surgery of the Retina and Vitreous [facility]  I have reviewed the above documentation for accuracy and completeness, and I agree with the above. Gardiner Sleeper, M.D., Ph.D. 03/24/20 11:34 AM  Abbreviations: M myopia (nearsighted); A astigmatism; H hyperopia (farsighted); P presbyopia; Mrx spectacle prescription;  CTL contact lenses; OD right eye; OS left eye; OU both eyes  XT exotropia; ET esotropia; PEK punctate epithelial keratitis; PEE punctate epithelial erosions; DES dry eye syndrome; MGD meibomian gland dysfunction; ATs artificial tears; PFAT's preservative free artificial tears; [place] nuclear sclerotic cataract; PSC posterior subcapsular cataract; ERM epi-retinal membrane; PVD posterior vitreous detachment; RD retinal detachment; DM diabetes mellitus; DR diabetic retinopathy; NPDR non-proliferative diabetic retinopathy; PDR proliferative diabetic retinopathy; CSME clinically significant macular edema; DME diabetic macular edema; dbh dot blot hemorrhages; CWS cotton wool spot; POAG primary open angle glaucoma; C/D cup-to-disc ratio; HVF humphrey visual field; GVF goldmann visual field; OCT optical coherence tomography; IOP intraocular pressure; BRVO Branch retinal vein occlusion; CRVO central retinal vein occlusion; CRAO central retinal artery occlusion; BRAO branch retinal artery occlusion; RT retinal tear; SB scleral buckle; PPV pars plana vitrectomy; VH Vitreous hemorrhage; PRP panretinal laser photocoagulation; IVK intravitreal kenalog; VMT vitreomacular traction; MH Macular hole;  NVD neovascularization of the disc; NVE neovascularization elsewhere; AREDS age related eye disease study; ARMD age related macular degeneration; POAG primary open angle glaucoma; EBMD epithelial/anterior basement membrane dystrophy; ACIOL anterior chamber intraocular lens;  IOL intraocular lens; PCIOL posterior chamber intraocular lens; Phaco/IOL phacoemulsification with intraocular lens placement; PRK photorefractive  keratectomy; LASIK laser assisted in situ keratomileusis; HTN hypertension; DM diabetes mellitus; COPD chronic obstructive pulmonary disease

## 2020-03-24 ENCOUNTER — Encounter (INDEPENDENT_AMBULATORY_CARE_PROVIDER_SITE_OTHER): Payer: Self-pay | Admitting: Ophthalmology

## 2020-03-24 ENCOUNTER — Ambulatory Visit (INDEPENDENT_AMBULATORY_CARE_PROVIDER_SITE_OTHER): Payer: Medicare Other | Admitting: Ophthalmology

## 2020-03-24 ENCOUNTER — Other Ambulatory Visit: Payer: Self-pay

## 2020-03-24 DIAGNOSIS — I10 Essential (primary) hypertension: Secondary | ICD-10-CM | POA: Diagnosis not present

## 2020-03-24 DIAGNOSIS — H34831 Tributary (branch) retinal vein occlusion, right eye, with macular edema: Secondary | ICD-10-CM | POA: Diagnosis not present

## 2020-03-24 DIAGNOSIS — H3581 Retinal edema: Secondary | ICD-10-CM | POA: Diagnosis not present

## 2020-03-24 DIAGNOSIS — H35033 Hypertensive retinopathy, bilateral: Secondary | ICD-10-CM

## 2020-03-24 DIAGNOSIS — Z961 Presence of intraocular lens: Secondary | ICD-10-CM

## 2020-03-24 DIAGNOSIS — E113313 Type 2 diabetes mellitus with moderate nonproliferative diabetic retinopathy with macular edema, bilateral: Secondary | ICD-10-CM | POA: Diagnosis not present

## 2020-03-24 MED ORDER — BEVACIZUMAB CHEMO INJECTION 1.25MG/0.05ML SYRINGE FOR KALEIDOSCOPE
1.2500 mg | INTRAVITREAL | Status: AC | PRN
  Administered 2020-03-24: 1.25 mg via INTRAVITREAL

## 2020-03-28 ENCOUNTER — Encounter (INDEPENDENT_AMBULATORY_CARE_PROVIDER_SITE_OTHER): Payer: Self-pay | Admitting: Ophthalmology

## 2020-03-28 ENCOUNTER — Encounter (INDEPENDENT_AMBULATORY_CARE_PROVIDER_SITE_OTHER): Payer: Managed Care, Other (non HMO) | Admitting: Ophthalmology

## 2020-03-28 ENCOUNTER — Ambulatory Visit (INDEPENDENT_AMBULATORY_CARE_PROVIDER_SITE_OTHER): Payer: Medicare Other | Admitting: Ophthalmology

## 2020-03-28 ENCOUNTER — Other Ambulatory Visit: Payer: Self-pay

## 2020-03-28 DIAGNOSIS — E113313 Type 2 diabetes mellitus with moderate nonproliferative diabetic retinopathy with macular edema, bilateral: Secondary | ICD-10-CM

## 2020-03-28 DIAGNOSIS — H35033 Hypertensive retinopathy, bilateral: Secondary | ICD-10-CM

## 2020-03-28 DIAGNOSIS — H3581 Retinal edema: Secondary | ICD-10-CM | POA: Diagnosis not present

## 2020-03-28 DIAGNOSIS — H34831 Tributary (branch) retinal vein occlusion, right eye, with macular edema: Secondary | ICD-10-CM | POA: Diagnosis not present

## 2020-03-28 DIAGNOSIS — I10 Essential (primary) hypertension: Secondary | ICD-10-CM | POA: Diagnosis not present

## 2020-03-28 DIAGNOSIS — Z961 Presence of intraocular lens: Secondary | ICD-10-CM

## 2020-03-28 MED ORDER — BEVACIZUMAB CHEMO INJECTION 1.25MG/0.05ML SYRINGE FOR KALEIDOSCOPE
1.2500 mg | INTRAVITREAL | Status: AC | PRN
  Administered 2020-03-28: 1.25 mg via INTRAVITREAL

## 2020-03-28 NOTE — Progress Notes (Signed)
[facility] Note  03/28/2020     CHIEF COMPLAINT Patient presents for Retina Follow Up   HISTORY OF PRESENT ILLNESS: Thomas Finley is a 65 y.o. male who presents to the clinic today for:   HPI    Retina Follow Up    Patient presents with  Diabetic Retinopathy.  In both eyes.  This started weeks ago.  Severity is moderate.  Duration of weeks.  Since onset it is stable.  I, the attending physician,  performed the HPI with the patient and updated documentation appropriately.          Comments    Pt states vision is the same OU.  Pt denies eye pain or discomfort and denies any new or worsening floaters or fol OU.       Last edited by Bernarda Caffey, MD on 03/28/2020 12:06 PM. (History)    pt is here for IVA OS #1  Referring physician: Kennieth Rad, MD [facility] [address] [place] [address]  HISTORICAL INFORMATION:   Selected notes from the MEDICAL RECORD NUMBER Referred by Dr. Monna Fam for concern of CRVO LEE:  Ocular Hx- PMH-    CURRENT MEDICATIONS: No current outpatient medications on file. (Ophthalmic Drugs)   No current facility-administered medications for this visit. (Ophthalmic Drugs)   Current Outpatient Medications (Other)  Medication Sig  . atorvastatin (LIPITOR) 10 MG tablet Take 10 mg by mouth daily.  . blood glucose meter kit and supplies KIT Dispense based on patient and insurance preference. Use up to four times daily as directed. (FOR ICD-10 E11.65).  . Blood Glucose Monitoring Suppl (ACCU-CHEK GUIDE) w/Device KIT 1 Piece by Does not apply route as directed.  . doxycycline (ADOXA) 100 MG tablet Take 100 mg by mouth 2 (two) times daily. for 10 days  . Dulaglutide (TRULICITY) 1.5 CL/2.7NT SOPN Inject into the skin once a week.  Marland Kitchen FLUoxetine (PROZAC) 20 MG tablet daily.  Marland Kitchen glipiZIDE (GLUCOTROL) 10 MG tablet Take 5 mg by mouth 2 (two) times daily with a meal. (Patient not taking: Reported on  03/24/2020)  . glucose blood (ACCU-CHEK GUIDE) test strip Use as instructed  . glucose blood test strip 1 each by Other route as needed. Use as instructed 4 x daily. E11.65. One touch ultra  . insulin degludec (TRESIBA FLEXTOUCH) 100 UNIT/ML SOPN FlexTouch Pen Inject 0.2 mLs (20 Units total) into the skin daily.  . Insulin Pen Needle (BD PEN NEEDLE NANO U/F) 32G X 4 MM MISC 1 each by Does not apply route at bedtime.  Marland Kitchen linagliptin (TRADJENTA) 5 MG TABS tablet Take 1 tablet (5 mg total) by mouth daily.  Marland Kitchen lisinopril (PRINIVIL,ZESTRIL) 2.5 MG tablet Take 2.5 mg by mouth daily.   No current facility-administered medications for this visit. (Other)      REVIEW OF SYSTEMS: ROS    Positive for: Endocrine, Cardiovascular, Eyes   Negative for: Constitutional, Gastrointestinal, Neurological, Skin, Genitourinary, Musculoskeletal, HENT, Respiratory, Psychiatric, Allergic/Imm, Heme/Lymph   Last edited by Doneen Poisson on 03/28/2020 10:05 AM. (History)       ALLERGIES No Known Allergies  PAST MEDICAL HISTORY Past Medical History:  Diagnosis Date  . Diabetes ([place])   . Hypertension    Past Surgical History:  Procedure Laterality Date  . CATARACT EXTRACTION Bilateral    Dr. Herbert Deaner    FAMILY HISTORY Family History  Problem Relation Age of Onset  . Diabetes Father   . Diabetes Sister   .  Diabetes Brother     SOCIAL HISTORY Social History   Tobacco Use  . Smoking status: Never Smoker  . Smokeless tobacco: Never Used  Vaping Use  . Vaping Use: Never used  Substance Use Topics  . Alcohol use: Never  . Drug use: Never         OPHTHALMIC EXAM:  Base Eye Exam    Visual Acuity (Snellen - Linear)      Right Left   Dist [place] 20/30 +1 20/25 -2   Dist ph [place] NI    Correction: Glasses       Tonometry (Tonopen, 10:11 AM)      Right Left   Pressure 13 19       Pupils      Dark Light Shape React APD   Right 3 2 Round Brisk 0   Left 3 2 Round Brisk 0       Visual Fields       Left Right    Full    Restrictions  Partial outer superior nasal deficiency  Poor fixation--unreliable       Extraocular Movement      Right Left    Full Full       Neuro/Psych    Oriented x3: Yes   Mood/Affect: Normal       Dilation    Both eyes: 1.0% Mydriacyl, 2.5% Phenylephrine @ 10:11 AM        Slit Lamp and Fundus Exam    Slit Lamp Exam      Right Left   Lids/Lashes Dermatochalasis - upper lid Dermatochalasis - upper lid   Conjunctiva/Sclera White and quiet White and quiet   Cornea 2+ Punctate epithelial erosions, well healed temporal cataract wounds trace Punctate epithelial erosions, mild arcus, well healed temporal cataract wounds   Anterior Chamber deep, clear, narrow temporal angle Deep and quiet   Iris Round and dilated, No NVI Round and dilated, No NVI   Lens PC IOL in good position PC IOL in good position   Vitreous Vitreous syneresis Vitreous syneresis       Fundus Exam      Right Left   Disc Nasal elevation, blurred margin, +heme, fine vessels, ?early NVD Pink and Sharp   C/D Ratio 0.2 0.4   Macula Blunted foveal reflex, nasal edema/DBH Blunted foveal reflex, scattered IRH/cystic changes, +edema   Vessels attenuated, Tortuous, Copper wiring, AV crossing changes greatest along IT arcades attenuated, Tortuous, AV crossing changes   Periphery Attached, scattered IRH/DBH    Attached, scattered IRH/DBH             IMAGING AND PROCEDURES  Imaging and Procedures for 03/28/2020  OCT, Retina - OU - Both Eyes       Right Eye Quality was good. Central Foveal Thickness: 412. Progression has improved. Findings include abnormal foveal contour, intraretinal fluid, subretinal fluid, intraretinal hyper-reflective material (Mild improvement in IRF, persistent SRF).   Left Eye Quality was good. Central Foveal Thickness: 433. Progression has worsened. Findings include abnormal foveal contour, intraretinal fluid, no SRF, vitreomacular adhesion  (Mild interval  increase in IRF).   Notes *Images captured and stored on drive  Diagnosis / Impression:  DME OU OD: Mild improvement in IRF, persistent SRF OS: Mild interval increase in IRF  Clinical management:  See below  Abbreviations: NFP - Normal foveal profile. CME - cystoid macular edema. PED - pigment epithelial detachment. IRF - intraretinal fluid. SRF - subretinal fluid. EZ - ellipsoid zone. ERM - epiretinal  membrane. ORA - outer retinal atrophy. ORT - outer retinal tubulation. SRHM - subretinal hyper-reflective material. IRHM - intraretinal hyper-reflective material        Intravitreal Injection, Pharmacologic Agent - OS - Left Eye       Time Out 03/28/2020. 10:40 AM. Confirmed correct patient, procedure, site, and patient consented.   Anesthesia Topical anesthesia was used. Anesthetic medications included Lidocaine 2%, Proparacaine 0.5%.   Procedure Preparation included 5% betadine to ocular surface, eyelid speculum. A (32g) needle was used.   Injection:  1.25 mg Bevacizumab (AVASTIN) 1.83m/0.05mL SOLN   NDC: 509326-712-45 Lot:: [id number] Expiration date: 04/24/2020   Route: Intravitreal, Site: Left Eye, Waste: 0.05 mL  Post-op Post injection exam found visual acuity of at least counting fingers. The patient tolerated the procedure well. There were no complications. The patient received written and verbal post procedure care education. Post injection medications were not given.                 ASSESSMENT/PLAN:    ICD-10-CM   1. Moderate nonproliferative diabetic retinopathy of both eyes with macular edema associated with type 2 diabetes mellitus (HCC)  EA25.0539Intravitreal Injection, Pharmacologic Agent - OS - Left Eye    Bevacizumab (AVASTIN) SOLN 1.25 mg  2. Retinal edema  H35.81 OCT, Retina - OU - Both Eyes  3. Branch retinal vein occlusion of right eye with macular edema  H34.8310   4. Essential hypertension  I10   5. Hypertensive retinopathy of both eyes  H35.033    6. Pseudophakia, both eyes  Z96.1     1,2. Moderate non-proliferative diabetic retinopathy, OU (OD>OS)  - s/p IVA OD #1 (12.31.21) - exam shows scattered MA/DBH OU, central edema OU - OCT shows diabetic macular edema, OU (OD > OS) -- OD compounded by BRVO component (see below) - recommend IVA OS #1 today, 01.04.22 - pt wishes to proceed with injection  - RBA of procedure discussed, questions answered  - Avastin informed consent obtained and signed (OU) - see procedure note - f/u in 4 weeks, DFE, OCT  3. BRVO w/ CME OD - BCVA 20/30 +1 - OCT shows +DME with BRVO component - F/U 4 weeks -- DFE/OCT/possible injection  4,5. Hypertensive retinopathy OU - discussed importance of tight BP control - monitor  6. Pseudophakia OU  - s/p CE/IOL OU (Dr. HHerbert Deaner  - IOLs in good position, doing well - monitor     Ophthalmic Meds Ordered this visit:  Meds ordered this encounter  Medications  . Bevacizumab (AVASTIN) SOLN 1.25 mg       Return in about 4 weeks (around 04/25/2020) for f/u NPDR OU, DFE, OCT.  There are no Patient Instructions on file for this visit.   Explained the diagnoses, plan, and follow up with the patient and they expressed understanding.  Patient expressed understanding of the importance of proper follow up care.   This document serves as a record of services personally performed by BGardiner Sleeper MD, PhD. It was created on their behalf by ASan Jetty BOwens Shark OA an ophthalmic technician. The creation of this record is the provider's dictation and/or activities during the visit.    Electronically signed by: ASan Jetty BOwens Shark [place]01.04.2022 12:08 PM  BGardiner Sleeper M.D., Ph.D. Diseases & Surgery of the Retina and Vitreous [facility] I have reviewed the above documentation for accuracy and completeness, and I agree with the above. BGardiner Sleeper M.D., Ph.D. 03/28/20 12:08 PM  Abbreviations: M myopia (nearsighted); A astigmatism; H  hyperopia (farsighted); P presbyopia; Mrx spectacle prescription;  CTL contact lenses; OD right eye; OS left eye; OU both eyes  XT exotropia; ET esotropia; PEK punctate epithelial keratitis; PEE punctate epithelial erosions; DES dry eye syndrome; MGD meibomian gland dysfunction; ATs artificial tears; PFAT's preservative free artificial tears; [place] nuclear sclerotic cataract; PSC posterior subcapsular cataract; ERM epi-retinal membrane; PVD posterior vitreous detachment; RD retinal detachment; DM diabetes mellitus; DR diabetic retinopathy; NPDR non-proliferative diabetic retinopathy; PDR proliferative diabetic retinopathy; CSME clinically significant macular edema; DME diabetic macular edema; dbh dot blot hemorrhages; CWS cotton wool spot; POAG primary open angle glaucoma; C/D cup-to-disc ratio; HVF humphrey visual field; GVF goldmann visual field; OCT optical coherence tomography; IOP intraocular pressure; BRVO Branch retinal vein occlusion; CRVO central retinal vein occlusion; CRAO central retinal artery occlusion; BRAO branch retinal artery occlusion; RT retinal tear; SB scleral buckle; PPV pars plana vitrectomy; VH Vitreous hemorrhage; PRP panretinal laser photocoagulation; IVK intravitreal kenalog; VMT vitreomacular traction; MH Macular hole;  NVD neovascularization of the disc; NVE neovascularization elsewhere; AREDS age related eye disease study; ARMD age related macular degeneration; POAG primary open angle glaucoma; EBMD epithelial/anterior basement membrane dystrophy; ACIOL anterior chamber intraocular lens; IOL intraocular lens; PCIOL posterior chamber intraocular lens; Phaco/IOL phacoemulsification with intraocular lens placement; [place] photorefractive keratectomy; LASIK laser assisted in situ keratomileusis; HTN hypertension; DM diabetes mellitus; COPD chronic obstructive pulmonary disease

## 2020-04-20 NOTE — Progress Notes (Shared)
[facility] Note  04/25/2020     CHIEF COMPLAINT Patient presents for No chief complaint on file.   HISTORY OF PRESENT ILLNESS: Thomas Finley is a 65 y.o. male who presents to the clinic today for:    Referring physician: Kennieth Rad, MD [facility] [address] [place] [address]  HISTORICAL INFORMATION:   Selected notes from the MEDICAL RECORD NUMBER Referred by Dr. Monna Fam for concern of CRVO   CURRENT MEDICATIONS: No current outpatient medications on file. (Ophthalmic Drugs)   No current facility-administered medications for this visit. (Ophthalmic Drugs)   Current Outpatient Medications (Other)  Medication Sig  . atorvastatin (LIPITOR) 10 MG tablet Take 10 mg by mouth daily.  . blood glucose meter kit and supplies KIT Dispense based on patient and insurance preference. Use up to four times daily as directed. (FOR ICD-10 E11.65).  . Blood Glucose Monitoring Suppl (ACCU-CHEK GUIDE) w/Device KIT 1 Piece by Does not apply route as directed.  . doxycycline (ADOXA) 100 MG tablet Take 100 mg by mouth 2 (two) times daily. for 10 days  . Dulaglutide (TRULICITY) 1.5 AO/1.3YQ SOPN Inject into the skin once a week.  Marland Kitchen FLUoxetine (PROZAC) 20 MG tablet daily.  Marland Kitchen glipiZIDE (GLUCOTROL) 10 MG tablet Take 5 mg by mouth 2 (two) times daily with a meal. (Patient not taking: Reported on 03/24/2020)  . glucose blood (ACCU-CHEK GUIDE) test strip Use as instructed  . glucose blood test strip 1 each by Other route as needed. Use as instructed 4 x daily. E11.65. One touch ultra  . insulin degludec (TRESIBA FLEXTOUCH) 100 UNIT/ML SOPN FlexTouch Pen Inject 0.2 mLs (20 Units total) into the skin daily.  . Insulin Pen Needle (BD PEN NEEDLE NANO U/F) 32G X 4 MM MISC 1 each by Does not apply route at bedtime.  Marland Kitchen linagliptin (TRADJENTA) 5 MG TABS tablet Take 1 tablet (5 mg total) by mouth daily.  Marland Kitchen lisinopril (PRINIVIL,ZESTRIL) 2.5 MG tablet  Take 2.5 mg by mouth daily.   No current facility-administered medications for this visit. (Other)      REVIEW OF SYSTEMS:    ALLERGIES No Known Allergies  PAST MEDICAL HISTORY Past Medical History:  Diagnosis Date  . Diabetes ([place])   . Hypertension    Past Surgical History:  Procedure Laterality Date  . CATARACT EXTRACTION Bilateral    Dr. Herbert Deaner    FAMILY HISTORY Family History  Problem Relation Age of Onset  . Diabetes Father   . Diabetes Sister   . Diabetes Brother     SOCIAL HISTORY Social History   Tobacco Use  . Smoking status: Never Smoker  . Smokeless tobacco: Never Used  Vaping Use  . Vaping Use: Never used  Substance Use Topics  . Alcohol use: Never  . Drug use: Never         OPHTHALMIC EXAM:  Not recorded     IMAGING AND PROCEDURES  Imaging and Procedures for 04/25/2020           ASSESSMENT/PLAN:  No diagnosis found.  1,2. Moderate non-proliferative diabetic retinopathy, OU (OD>OS)  - s/p IVA OD #1 (12.31.21) - exam shows scattered MA/DBH OU, central edema OU - OCT shows diabetic macular edema, OU (OD > OS) -- OD compounded by BRVO component (see below) - IVA OS #1 (01.04.22) - Recommend IVA OS #2 today, 2.1.22 - pt wishes to proceed with injection  - RBA of procedure discussed, questions answered  -  Avastin informed consent obtained and signed (OU) - see procedure note - f/u in 4 weeks, DFE, OCT  3. BRVO w/ CME OD - BCVA 20/30 +1 - OCT shows +DME with BRVO component - F/U 4 weeks -- DFE/OCT/possible injection  4,5. Hypertensive retinopathy OU - discussed importance of tight BP control - monitor  6. Pseudophakia OU  - s/p CE/IOL OU (Dr. Herbert Deaner)  - IOLs in good position, doing well - monitor  Ophthalmic Meds Ordered this visit:  No orders of the defined types were placed in this encounter.     No follow-ups on file.  There are no Patient Instructions on file for this visit.  Explained the diagnoses, plan,  and follow up with the patient and they expressed understanding.  Patient expressed understanding of the importance of proper follow up care.   This document serves as a record of services personally performed by Gardiner Sleeper, MD, PhD. It was created on their behalf by Estill Bakes, COT an ophthalmic technician. The creation of this record is the provider's dictation and/or activities during the visit.    Electronically signed by: Estill Bakes, COT 1.27.22 @ 10:56 AM  Abbreviations: M myopia (nearsighted); A astigmatism; H hyperopia (farsighted); P presbyopia; Mrx spectacle prescription;  CTL contact lenses; OD right eye; OS left eye; OU both eyes  XT exotropia; ET esotropia; PEK punctate epithelial keratitis; PEE punctate epithelial erosions; DES dry eye syndrome; MGD meibomian gland dysfunction; ATs artificial tears; PFAT's preservative free artificial tears; [place] nuclear sclerotic cataract; PSC posterior subcapsular cataract; ERM epi-retinal membrane; PVD posterior vitreous detachment; RD retinal detachment; DM diabetes mellitus; DR diabetic retinopathy; NPDR non-proliferative diabetic retinopathy; PDR proliferative diabetic retinopathy; CSME clinically significant macular edema; DME diabetic macular edema; dbh dot blot hemorrhages; CWS cotton wool spot; POAG primary open angle glaucoma; C/D cup-to-disc ratio; HVF humphrey visual field; GVF goldmann visual field; OCT optical coherence tomography; IOP intraocular pressure; BRVO Branch retinal vein occlusion; CRVO central retinal vein occlusion; CRAO central retinal artery occlusion; BRAO branch retinal artery occlusion; RT retinal tear; SB scleral buckle; PPV pars plana vitrectomy; VH Vitreous hemorrhage; PRP panretinal laser photocoagulation; IVK intravitreal kenalog; VMT vitreomacular traction; MH Macular hole;  NVD neovascularization of the disc; NVE neovascularization elsewhere; AREDS age related eye disease study; ARMD age related macular  degeneration; POAG primary open angle glaucoma; EBMD epithelial/anterior basement membrane dystrophy; ACIOL anterior chamber intraocular lens; IOL intraocular lens; PCIOL posterior chamber intraocular lens; Phaco/IOL phacoemulsification with intraocular lens placement; [place] photorefractive keratectomy; LASIK laser assisted in situ keratomileusis; HTN hypertension; DM diabetes mellitus; COPD chronic obstructive pulmonary disease

## 2020-04-25 ENCOUNTER — Encounter (INDEPENDENT_AMBULATORY_CARE_PROVIDER_SITE_OTHER): Payer: Medicare Other | Admitting: Ophthalmology

## 2020-04-25 DIAGNOSIS — H3581 Retinal edema: Secondary | ICD-10-CM

## 2020-04-25 DIAGNOSIS — Z961 Presence of intraocular lens: Secondary | ICD-10-CM

## 2020-04-25 DIAGNOSIS — H35033 Hypertensive retinopathy, bilateral: Secondary | ICD-10-CM

## 2020-04-25 DIAGNOSIS — H34831 Tributary (branch) retinal vein occlusion, right eye, with macular edema: Secondary | ICD-10-CM

## 2020-04-25 DIAGNOSIS — I10 Essential (primary) hypertension: Secondary | ICD-10-CM

## 2020-04-25 DIAGNOSIS — E113313 Type 2 diabetes mellitus with moderate nonproliferative diabetic retinopathy with macular edema, bilateral: Secondary | ICD-10-CM

## 2020-04-26 NOTE — Progress Notes (Signed)
[facility] Note  04/28/2020     CHIEF COMPLAINT Patient presents for Retina Follow Up   HISTORY OF PRESENT ILLNESS: Thomas Finley is a 65 y.o. male who presents to the clinic today for:   HPI    Retina Follow Up    Patient presents with  Diabetic Retinopathy.  In both eyes.  Duration of 4 weeks.  Since onset it is stable.  I, the attending physician,  performed the HPI with the patient and updated documentation appropriately.          Comments    4 week follow up NPDR OU-  Vision appears the same OU since last visit.  BS unsure  A1C he thinks it was 8 but unsure       Last edited by Bernarda Caffey, MD on 04/28/2020  1:02 PM. (History)    pt states he cannot tell any difference in his vision  Referring physician: Kennieth Rad, MD [facility] [address] [place] [address]  HISTORICAL INFORMATION:   Selected notes from the MEDICAL RECORD NUMBER Referred by Dr. Monna Fam for concern of CRVO LEE:  Ocular Hx- PMH-    CURRENT MEDICATIONS: No current outpatient medications on file. (Ophthalmic Drugs)   No current facility-administered medications for this visit. (Ophthalmic Drugs)   Current Outpatient Medications (Other)  Medication Sig  . atorvastatin (LIPITOR) 10 MG tablet Take 10 mg by mouth daily.  . Dulaglutide (TRULICITY) 1.5 XB/2.8UX SOPN Inject into the skin once a week.  Marland Kitchen FLUoxetine (PROZAC) 20 MG tablet daily.  . insulin degludec (TRESIBA FLEXTOUCH) 100 UNIT/ML SOPN FlexTouch Pen Inject 0.2 mLs (20 Units total) into the skin daily.  . Insulin Pen Needle (BD PEN NEEDLE NANO U/F) 32G X 4 MM MISC 1 each by Does not apply route at bedtime.  Marland Kitchen linagliptin (TRADJENTA) 5 MG TABS tablet Take 1 tablet (5 mg total) by mouth daily.  Marland Kitchen lisinopril (PRINIVIL,ZESTRIL) 2.5 MG tablet Take 2.5 mg by mouth daily.  . blood glucose meter kit and supplies KIT Dispense based on patient and insurance preference. Use up to four  times daily as directed. (FOR ICD-10 E11.65).  . Blood Glucose Monitoring Suppl (ACCU-CHEK GUIDE) w/Device KIT 1 Piece by Does not apply route as directed.  . doxycycline (ADOXA) 100 MG tablet Take 100 mg by mouth 2 (two) times daily. for 10 days (Patient not taking: Reported on 04/28/2020)  . glipiZIDE (GLUCOTROL) 10 MG tablet Take 5 mg by mouth 2 (two) times daily with a meal. (Patient not taking: No sig reported)  . glucose blood (ACCU-CHEK GUIDE) test strip Use as instructed  . glucose blood test strip 1 each by Other route as needed. Use as instructed 4 x daily. E11.65. One touch ultra   No current facility-administered medications for this visit. (Other)      REVIEW OF SYSTEMS: ROS    Positive for: Genitourinary, Endocrine, Cardiovascular, Eyes   Negative for: Constitutional, Gastrointestinal, Neurological, Skin, Musculoskeletal, HENT, Respiratory, Psychiatric, Allergic/Imm, Heme/Lymph   Last edited by Leonie Douglas, COA on 04/28/2020  9:25 AM. (History)       ALLERGIES No Known Allergies  PAST MEDICAL HISTORY Past Medical History:  Diagnosis Date  . Diabetes ([place])   . Hypertension    Past Surgical History:  Procedure Laterality Date  . CATARACT EXTRACTION Bilateral    Dr. Herbert Deaner    FAMILY HISTORY Family History  Problem Relation Age of Onset  . Diabetes Father   .  Diabetes Sister   . Diabetes Brother     SOCIAL HISTORY Social History   Tobacco Use  . Smoking status: Never Smoker  . Smokeless tobacco: Never Used  Vaping Use  . Vaping Use: Never used  Substance Use Topics  . Alcohol use: Never  . Drug use: Never         OPHTHALMIC EXAM:  Base Eye Exam    Visual Acuity (Snellen - Linear)      Right Left   Dist [place] 20/30 +2 20/25 -2   Dist ph [place] NI NI       Tonometry (Tonopen, 9:31 AM)      Right Left   Pressure 18 17       Pupils      Dark Light Shape React APD   Right 2 1 Round Minimal None   Left 2 1 Round Minimal None       Visual  Fields (Counting fingers)      Left Right    Full Full  Grossly full, poor fixation.       Extraocular Movement      Right Left    Full Full       Neuro/Psych    Oriented x3: Yes   Mood/Affect: Normal       Dilation    Both eyes: 1.0% Mydriacyl, 2.5% Phenylephrine @ 9:31 AM        Slit Lamp and Fundus Exam    Slit Lamp Exam      Right Left   Lids/Lashes Dermatochalasis - upper lid Dermatochalasis - upper lid   Conjunctiva/Sclera White and quiet White and quiet   Cornea 2+ Punctate epithelial erosions, well healed temporal cataract wounds trace Punctate epithelial erosions, mild arcus, well healed temporal cataract wounds   Anterior Chamber deep, clear, narrow temporal angle Deep and quiet   Iris Round and dilated, No NVI Round and dilated, No NVI   Lens PC IOL in good position PC IOL in good position   Vitreous Vitreous syneresis Vitreous syneresis       Fundus Exam      Right Left   Disc Nasal elevation, blurred margin, +heme, fine vessels, ?early NVD -- all improved Pink and Sharp   C/D Ratio 0.2 0.4   Macula Blunted foveal reflex, nasal edema/DBH - slighlty improved Blunted foveal reflex, scattered IRH/cystic changes, +edema - improved, mild CWS   Vessels attenuated, Tortuous, Copper wiring, AV crossing changes greatest along IT arcades attenuated, Tortuous, AV crossing changes   Periphery Attached, scattered IRH/DBH greatest posteriorly Attached, scattered IRH/DBH             IMAGING AND PROCEDURES  Imaging and Procedures for 04/28/2020  OCT, Retina - OU - Both Eyes       Right Eye Quality was good. Central Foveal Thickness: 411. Progression has improved. Findings include abnormal foveal contour, intraretinal fluid, subretinal fluid, intraretinal hyper-reflective material (Mild improvement in IRF, interval increase in SRF).   Left Eye Quality was good. Central Foveal Thickness: 380. Progression has improved. Findings include abnormal foveal contour,  intraretinal fluid, no SRF, vitreomacular adhesion  (Interval improvement in IRF).   Notes *Images captured and stored on drive  Diagnosis / Impression:  DME OU OD: Mild improvement in IRF, interval increase in SRF OS: Interval improvement in IRF  Clinical management:  See below  Abbreviations: NFP - Normal foveal profile. CME - cystoid macular edema. PED - pigment epithelial detachment. IRF - intraretinal fluid. SRF - subretinal fluid.  EZ - ellipsoid zone. ERM - epiretinal membrane. ORA - outer retinal atrophy. ORT - outer retinal tubulation. SRHM - subretinal hyper-reflective material. IRHM - intraretinal hyper-reflective material        Intravitreal Injection, Pharmacologic Agent - OD - Right Eye       Time Out 04/28/2020. 10:08 AM. Confirmed correct patient, procedure, site, and patient consented.   Anesthesia Topical anesthesia was used. Anesthetic medications included Lidocaine 2%, Proparacaine 0.5%.   Procedure Preparation included eyelid speculum, 5% betadine to ocular surface. A supplied needle was used.   Injection:  1.25 mg Bevacizumab (AVASTIN) 1.45m/0.05mL SOLN   NDC: 596295-284-13 Lot: [id number] , Expiration date: 05/31/2020   Route: Intravitreal, Site: Right Eye, Waste: 0 mL  Post-op Post injection exam found visual acuity of at least counting fingers. The patient tolerated the procedure well. There were no complications. The patient received written and verbal post procedure care education.        Intravitreal Injection, Pharmacologic Agent - OS - Left Eye       Time Out 04/28/2020. 10:08 AM. Confirmed correct patient, procedure, site, and patient consented.   Anesthesia Topical anesthesia was used. Anesthetic medications included Lidocaine 2%, Proparacaine 0.5%.   Procedure Preparation included 5% betadine to ocular surface, eyelid speculum. A (32g) needle was used.   Injection:  1.25 mg Bevacizumab (AVASTIN) 1.294m0.05mL SOLN   NDC: 5024401-027-25 Lot: [id number] , Expiration date: 05/31/2020   Route: Intravitreal, Site: Left Eye, Waste: 0 mL  Post-op Post injection exam found visual acuity of at least counting fingers. The patient tolerated the procedure well. There were no complications. The patient received written and verbal post procedure care education. Post injection medications were not given.                 ASSESSMENT/PLAN:    ICD-10-CM   1. Moderate nonproliferative diabetic retinopathy of both eyes with macular edema associated with type 2 diabetes mellitus (HCC)  E116/9/2022ntravitreal Injection, Pharmacologic Agent - OD - Right Eye    Intravitreal Injection, Pharmacologic Agent - OS - Left Eye    Bevacizumab (AVASTIN) SOLN 1.25 mg    Bevacizumab (AVASTIN) SOLN 1.25 mg  2. Retinal edema  H35.81 OCT, Retina - OU - Both Eyes  3. Branch retinal vein occlusion of right eye with macular edema  H34.8310   4. Essential hypertension  I10   5. Hypertensive retinopathy of both eyes  H35.033   6. Pseudophakia, both eyes  Z96.1     1,2. Moderate non-proliferative diabetic retinopathy, OU (OD>OS)  - s/p IVA OD #1 (12.31.21)  - s/p IVA OS #1 (01.04.22) - exam shows scattered MA/DBH OU, central edema OU - BCVA stable - 20/30 OD, 20/25 OS  - OCT shows OD: Mild improvement in IRF, interval increase in SRF; OS: Interval improvement in IRF - recommend IVA OU #2 today, 02.04.22 - pt wishes to proceed with injection  - RBA of procedure discussed, questions answered  - Avastin informed consent obtained and signed (OU) - see procedure note - f/u in 4 weeks, DFE, OCT  3. BRVO w/ CME OD  - s/p IVA OD #1 (12.31.21) as above - BCVA 20/30 stabke - OCT shows +DME with BRVO component - recommend IVA OD #2 today as above - F/U 4 weeks -- DFE/OCT/possible injection  4,5. Hypertensive retinopathy OU - discussed importance of tight BP control - monitor  6. Pseudophakia OU  - s/p CE/IOL OU (Dr. [id number] - IOLs in good position,  doing well -  monitor   Ophthalmic Meds Ordered this visit:  Meds ordered this encounter  Medications  . Bevacizumab (AVASTIN) SOLN 1.25 mg  . Bevacizumab (AVASTIN) SOLN 1.25 mg       Return in about 4 weeks (around 05/26/2020) for f/u NPDR OU, DFE, OCT.  There are no Patient Instructions on file for this visit.   Explained the diagnoses, plan, and follow up with the patient and they expressed understanding.  Patient expressed understanding of the importance of proper follow up care.   This document serves as a record of services personally performed by Gardiner Sleeper, MD, PhD. It was created on their behalf by San Jetty. Owens Shark, OA an ophthalmic technician. The creation of this record is the provider's dictation and/or activities during the visit.    Electronically signed by: San Jetty. Owens Shark, [place] 02.02.2022 1:06 PM  Gardiner Sleeper, M.D., Ph.D. Diseases & Surgery of the Retina and Vitreous [facility]  I have reviewed the above documentation for accuracy and completeness, and I agree with the above. Gardiner Sleeper, M.D., Ph.D. 04/28/20 1:06 PM    Abbreviations: M myopia (nearsighted); A astigmatism; H hyperopia (farsighted); P presbyopia; Mrx spectacle prescription;  CTL contact lenses; OD right eye; OS left eye; OU both eyes  XT exotropia; ET esotropia; PEK punctate epithelial keratitis; PEE punctate epithelial erosions; DES dry eye syndrome; MGD meibomian gland dysfunction; ATs artificial tears; PFAT's preservative free artificial tears; [place] nuclear sclerotic cataract; PSC posterior subcapsular cataract; ERM epi-retinal membrane; PVD posterior vitreous detachment; RD retinal detachment; DM diabetes mellitus; DR diabetic retinopathy; NPDR non-proliferative diabetic retinopathy; PDR proliferative diabetic retinopathy; CSME clinically significant macular edema; DME diabetic macular edema; dbh dot blot hemorrhages; CWS cotton wool spot; POAG primary open angle  glaucoma; C/D cup-to-disc ratio; HVF humphrey visual field; GVF goldmann visual field; OCT optical coherence tomography; IOP intraocular pressure; BRVO Branch retinal vein occlusion; CRVO central retinal vein occlusion; CRAO central retinal artery occlusion; BRAO branch retinal artery occlusion; RT retinal tear; SB scleral buckle; PPV pars plana vitrectomy; VH Vitreous hemorrhage; PRP panretinal laser photocoagulation; IVK intravitreal kenalog; VMT vitreomacular traction; MH Macular hole;  NVD neovascularization of the disc; NVE neovascularization elsewhere; AREDS age related eye disease study; ARMD age related macular degeneration; POAG primary open angle glaucoma; EBMD epithelial/anterior basement membrane dystrophy; ACIOL anterior chamber intraocular lens; IOL intraocular lens; PCIOL posterior chamber intraocular lens; Phaco/IOL phacoemulsification with intraocular lens placement; [place] photorefractive keratectomy; LASIK laser assisted in situ keratomileusis; HTN hypertension; DM diabetes mellitus; COPD chronic obstructive pulmonary disease

## 2020-04-28 ENCOUNTER — Ambulatory Visit (INDEPENDENT_AMBULATORY_CARE_PROVIDER_SITE_OTHER): Payer: Medicare Other | Admitting: Ophthalmology

## 2020-04-28 ENCOUNTER — Other Ambulatory Visit: Payer: Self-pay

## 2020-04-28 ENCOUNTER — Encounter (INDEPENDENT_AMBULATORY_CARE_PROVIDER_SITE_OTHER): Payer: Self-pay | Admitting: Ophthalmology

## 2020-04-28 DIAGNOSIS — H3581 Retinal edema: Secondary | ICD-10-CM | POA: Diagnosis not present

## 2020-04-28 DIAGNOSIS — E113313 Type 2 diabetes mellitus with moderate nonproliferative diabetic retinopathy with macular edema, bilateral: Secondary | ICD-10-CM

## 2020-04-28 DIAGNOSIS — Z961 Presence of intraocular lens: Secondary | ICD-10-CM

## 2020-04-28 DIAGNOSIS — H35033 Hypertensive retinopathy, bilateral: Secondary | ICD-10-CM

## 2020-04-28 DIAGNOSIS — I10 Essential (primary) hypertension: Secondary | ICD-10-CM | POA: Diagnosis not present

## 2020-04-28 DIAGNOSIS — H34831 Tributary (branch) retinal vein occlusion, right eye, with macular edema: Secondary | ICD-10-CM | POA: Diagnosis not present

## 2020-04-28 MED ORDER — BEVACIZUMAB CHEMO INJECTION 1.25MG/0.05ML SYRINGE FOR KALEIDOSCOPE
1.2500 mg | INTRAVITREAL | Status: AC | PRN
  Administered 2020-04-28: 1.25 mg via INTRAVITREAL

## 2020-05-17 NOTE — Progress Notes (Signed)
[facility] Note  05/26/2020     CHIEF COMPLAINT Patient presents for Retina Follow Up   HISTORY OF PRESENT ILLNESS: Thomas Finley is a 65 y.o. male who presents to the clinic today for:   HPI    Retina Follow Up    Patient presents with  Diabetic Retinopathy.  In both eyes.  Severity is mild.  Duration of 4 weeks.  Since onset it is stable.  I, the attending physician,  performed the HPI with the patient and updated documentation appropriately.          Comments    4 week Retina follow up for NPDRou. Patient states no change in vision noticed. Blood sugar unknown.        Last edited by Bernarda Caffey, MD on 05/27/2020  1:54 AM. (History)    pt states  Referring physician: Kennieth Rad, MD [facility] [address] [place] [address]  HISTORICAL INFORMATION:   Selected notes from the MEDICAL RECORD NUMBER Referred by Dr. Monna Fam for concern of CRVO LEE:  Ocular Hx- PMH-    CURRENT MEDICATIONS: No current outpatient medications on file. (Ophthalmic Drugs)   No current facility-administered medications for this visit. (Ophthalmic Drugs)   Current Outpatient Medications (Other)  Medication Sig  . atorvastatin (LIPITOR) 10 MG tablet Take 10 mg by mouth daily.  . blood glucose meter kit and supplies KIT Dispense based on patient and insurance preference. Use up to four times daily as directed. (FOR ICD-10 E11.65).  . Blood Glucose Monitoring Suppl (ACCU-CHEK GUIDE) w/Device KIT 1 Piece by Does not apply route as directed.  . doxycycline (ADOXA) 100 MG tablet Take 100 mg by mouth 2 (two) times daily. for 10 days  . Dulaglutide (TRULICITY) 1.5 ZH/0.8MV SOPN Inject into the skin once a week.  Marland Kitchen FLUoxetine (PROZAC) 20 MG tablet daily.  Marland Kitchen glipiZIDE (GLUCOTROL) 10 MG tablet Take 5 mg by mouth 2 (two) times daily with a meal.  . glucose blood (ACCU-CHEK GUIDE) test strip Use as instructed  . glucose blood test strip 1  each by Other route as needed. Use as instructed 4 x daily. E11.65. One touch ultra  . insulin degludec (TRESIBA FLEXTOUCH) 100 UNIT/ML SOPN FlexTouch Pen Inject 0.2 mLs (20 Units total) into the skin daily.  . Insulin Pen Needle (BD PEN NEEDLE NANO U/F) 32G X 4 MM MISC 1 each by Does not apply route at bedtime.  Marland Kitchen linagliptin (TRADJENTA) 5 MG TABS tablet Take 1 tablet (5 mg total) by mouth daily.  Marland Kitchen lisinopril (PRINIVIL,ZESTRIL) 2.5 MG tablet Take 2.5 mg by mouth daily.   No current facility-administered medications for this visit. (Other)      REVIEW OF SYSTEMS: ROS    Positive for: Genitourinary, Endocrine, Cardiovascular, Eyes   Negative for: Constitutional, Gastrointestinal, Neurological, Skin, Musculoskeletal, HENT, Respiratory, Psychiatric, Allergic/Imm, Heme/Lymph   Last edited by Elmore Guise, COT on 05/26/2020  9:46 AM. (History)       ALLERGIES No Known Allergies  PAST MEDICAL HISTORY Past Medical History:  Diagnosis Date  . Diabetes ([place])   . Hypertension    Past Surgical History:  Procedure Laterality Date  . CATARACT EXTRACTION Bilateral    Dr. Herbert Deaner    FAMILY HISTORY Family History  Problem Relation Age of Onset  . Diabetes Father   . Diabetes Sister   . Diabetes Brother     SOCIAL HISTORY Social History   Tobacco Use  .  Smoking status: Never Smoker  . Smokeless tobacco: Never Used  Vaping Use  . Vaping Use: Never used  Substance Use Topics  . Alcohol use: Never  . Drug use: Never         OPHTHALMIC EXAM:  Base Eye Exam    Visual Acuity (Snellen - Linear)      Right Left   Dist [place] 20/20-3 20/20-2       Tonometry (Tonopen, 9:53 AM)      Right Left   Pressure 12 18       Pupils      Dark Light Shape React APD   Right 2 1 Round Minimal None   Left 2 1 Round Minimal None       Visual Fields (Counting fingers)      Left Right    Full Full       Extraocular Movement      Right Left    Full, Ortho Full, Ortho        Neuro/Psych    Oriented x3: Yes   Mood/Affect: Normal       Dilation    Both eyes: 1.0% Mydriacyl, 2.5% Phenylephrine @ 9:53 AM        Slit Lamp and Fundus Exam    Slit Lamp Exam      Right Left   Lids/Lashes Dermatochalasis - upper lid Dermatochalasis - upper lid   Conjunctiva/Sclera White and quiet White and quiet   Cornea 2+ Punctate epithelial erosions, well healed temporal cataract wounds trace Punctate epithelial erosions, mild arcus, well healed temporal cataract wounds   Anterior Chamber deep, clear, narrow temporal angle Deep and quiet   Iris Round and dilated, No NVI Round and dilated, No NVI   Lens PC IOL in good position PC IOL in good position   Vitreous Vitreous syneresis Vitreous syneresis       Fundus Exam      Right Left   Disc mild Pallor, Sharp rim, no NVD Pink and Sharp   C/D Ratio 0.2 0.4   Macula good foveal reflex, scattered MA/DBH/CWS, +edema good foveal reflex, scattered IRH/cystic changes, +edema - persistent, mild CWS   Vessels attenuated, Tortuous attenuated, Tortuous   Periphery Attached, scattered IRH/DBH/CWS greatest posteriorly Attached, scattered IRH/DBH greatest nasal to disc           IMAGING AND PROCEDURES  Imaging and Procedures for 05/26/2020  OCT, Retina - OU - Both Eyes       Right Eye Quality was good. Central Foveal Thickness: 375. Progression has improved. Findings include abnormal foveal contour, intraretinal fluid, subretinal fluid, intraretinal hyper-reflective material (Mild interval improvement in IRF/SRF).   Left Eye Quality was good. Central Foveal Thickness: 384. Progression has worsened. Findings include abnormal foveal contour, intraretinal fluid, no SRF, vitreomacular adhesion  (Mild interval increase in IRF).   Notes *Images captured and stored on drive  Diagnosis / Impression:  DME OU OD: Mild interval improvement in IRF/SRF OS: Mild interval increase in IRF  Clinical management:  See below  Abbreviations:  NFP - Normal foveal profile. CME - cystoid macular edema. PED - pigment epithelial detachment. IRF - intraretinal fluid. SRF - subretinal fluid. EZ - ellipsoid zone. ERM - epiretinal membrane. ORA - outer retinal atrophy. ORT - outer retinal tubulation. SRHM - subretinal hyper-reflective material. IRHM - intraretinal hyper-reflective material        Intravitreal Injection, Pharmacologic Agent - OD - Right Eye       Time Out 05/26/2020. 10:35 AM.  Confirmed correct patient, procedure, site, and patient consented.   Anesthesia Topical anesthesia was used. Anesthetic medications included Lidocaine 2%, Proparacaine 0.5%.   Procedure Preparation included eyelid speculum, 5% betadine to ocular surface. A supplied needle was used.   Injection:  1.25 mg Bevacizumab (AVASTIN) 1.42m/0.05mL SOLN   NDC: 581829-937-16 Lot: [id number] , Expiration date: 07/05/2020   Route: Intravitreal, Site: Right Eye, Waste: 0 mL  Post-op Post injection exam found visual acuity of at least counting fingers. The patient tolerated the procedure well. There were no complications. The patient received written and verbal post procedure care education.        Intravitreal Injection, Pharmacologic Agent - OS - Left Eye       Time Out 05/26/2020. 10:35 AM. Confirmed correct patient, procedure, site, and patient consented.   Anesthesia Topical anesthesia was used. Anesthetic medications included Lidocaine 2%, Proparacaine 0.5%.   Procedure Preparation included 5% betadine to ocular surface, eyelid speculum. A (32g) needle was used.   Injection:  1.25 mg Bevacizumab (AVASTIN) 1.260m0.05mL SOLN   NDC: 5030mLot: [id number], Expiration date: 08/01/2020   Route: Intravitreal, Site: Left Eye, Waste: 0.05 mL  Post-op Post injection exam found visual acuity of at least counting fingers. The patient tolerated the procedure well. There were no complications. The patient received written and verbal post procedure care  education. Post injection medications were not given.                 ASSESSMENT/PLAN:    ICD-10-CM   1. Moderate nonproliferative diabetic retinopathy of both eyes with macular edema associated with type 2 diabetes mellitus (HCC)  E118/10/2022ntravitreal Injection, Pharmacologic Agent - OD - Right Eye    Intravitreal Injection, Pharmacologic Agent - OS - Left Eye    Bevacizumab (AVASTIN) SOLN 1.25 mg    Bevacizumab (AVASTIN) SOLN 1.25 mg  2. Retinal edema  H35.81 OCT, Retina - OU - Both Eyes  3. Branch retinal vein occlusion of right eye with macular edema  H34.8310 Intravitreal Injection, Pharmacologic Agent - OD - Right Eye    Bevacizumab (AVASTIN) SOLN 1.25 mg  4. Essential hypertension  I10   5. Hypertensive retinopathy of both eyes  H35.033   6. Pseudophakia, both eyes  Z96.1     1,2. Moderate non-proliferative diabetic retinopathy, OU (OD>OS)  - s/p IVA OD #1 (12.31.21)  - s/p IVA OS #1 (01.04.22)  - s/p IVA OU #2 (02.04.22) - exam shows scattered MA/DBH OU, central edema OU - BCVA improved to 20/20 OU  - OCT shows OD: Mild interval improvement in IRF/SRF; OS: Mild interval increase in IRF - recommend IVA OU #3 today, 03.04.22 - pt wishes to proceed with injection  - RBA of procedure discussed, questions answered  - Avastin informed consent obtained and signed (OU) - see procedure note - f/u in 4 weeks, DFE, OCT  3. BRVO w/ CME OD  - s/p IVA OD x2 as above - BCVA 20/20 -- improved - OCT shows +DME with BRVO component - recommend IVA OD #3 today as above - F/U 4 weeks -- DFE/OCT/possible injection  4,5. Hypertensive retinopathy OU - discussed importance of tight BP control - monitor  6. Pseudophakia OU  - s/p CE/IOL OU (Dr. He16.04.22 - IOLs in good position, doing well - monitor   Ophthalmic Meds Ordered this visit:  Meds ordered this encounter  Medications  . Bevacizumab (AVASTIN) SOLN 1.25 mg  . Bevacizumab (AVASTIN) SOLN 1.25 mg       Return  in  about 4 weeks (around 06/23/2020) for f/u NPDR OU, DFE, OCT.  There are no Patient Instructions on file for this visit.   Explained the diagnoses, plan, and follow up with the patient and they expressed understanding.  Patient expressed understanding of the importance of proper follow up care.   This document serves as a record of services personally performed by Gardiner Sleeper, MD, PhD. It was created on their behalf by San Jetty. Owens Shark, OA an ophthalmic technician. The creation of this record is the provider's dictation and/or activities during the visit.    Electronically signed by: San Jetty. Owens Shark, [place] 02.23.2022 1:57 AM . Gardiner Sleeper, M.D., Ph.D. Diseases & Surgery of the Retina and Vitreous [facility]  I have reviewed the above documentation for accuracy and completeness, and I agree with the above. Gardiner Sleeper, M.D., Ph.D. 05/27/20 1:57 AM  Abbreviations: M myopia (nearsighted); A astigmatism; H hyperopia (farsighted); P presbyopia; Mrx spectacle prescription;  CTL contact lenses; OD right eye; OS left eye; OU both eyes  XT exotropia; ET esotropia; PEK punctate epithelial keratitis; PEE punctate epithelial erosions; DES dry eye syndrome; MGD meibomian gland dysfunction; ATs artificial tears; PFAT's preservative free artificial tears; [place] nuclear sclerotic cataract; PSC posterior subcapsular cataract; ERM epi-retinal membrane; PVD posterior vitreous detachment; RD retinal detachment; DM diabetes mellitus; DR diabetic retinopathy; NPDR non-proliferative diabetic retinopathy; PDR proliferative diabetic retinopathy; CSME clinically significant macular edema; DME diabetic macular edema; dbh dot blot hemorrhages; CWS cotton wool spot; POAG primary open angle glaucoma; C/D cup-to-disc ratio; HVF humphrey visual field; GVF goldmann visual field; OCT optical coherence tomography; IOP intraocular pressure; BRVO Branch retinal vein occlusion; CRVO central retinal vein  occlusion; CRAO central retinal artery occlusion; BRAO branch retinal artery occlusion; RT retinal tear; SB scleral buckle; PPV pars plana vitrectomy; VH Vitreous hemorrhage; PRP panretinal laser photocoagulation; IVK intravitreal kenalog; VMT vitreomacular traction; MH Macular hole;  NVD neovascularization of the disc; NVE neovascularization elsewhere; AREDS age related eye disease study; ARMD age related macular degeneration; POAG primary open angle glaucoma; EBMD epithelial/anterior basement membrane dystrophy; ACIOL anterior chamber intraocular lens; IOL intraocular lens; PCIOL posterior chamber intraocular lens; Phaco/IOL phacoemulsification with intraocular lens placement; [place] photorefractive keratectomy; LASIK laser assisted in situ keratomileusis; HTN hypertension; DM diabetes mellitus; COPD chronic obstructive pulmonary disease

## 2020-05-26 ENCOUNTER — Ambulatory Visit (INDEPENDENT_AMBULATORY_CARE_PROVIDER_SITE_OTHER): Payer: Medicare Other | Admitting: Ophthalmology

## 2020-05-26 ENCOUNTER — Other Ambulatory Visit: Payer: Self-pay

## 2020-05-26 ENCOUNTER — Encounter (INDEPENDENT_AMBULATORY_CARE_PROVIDER_SITE_OTHER): Payer: Self-pay | Admitting: Ophthalmology

## 2020-05-26 DIAGNOSIS — H3581 Retinal edema: Secondary | ICD-10-CM

## 2020-05-26 DIAGNOSIS — E113313 Type 2 diabetes mellitus with moderate nonproliferative diabetic retinopathy with macular edema, bilateral: Secondary | ICD-10-CM

## 2020-05-26 DIAGNOSIS — Z961 Presence of intraocular lens: Secondary | ICD-10-CM

## 2020-05-26 DIAGNOSIS — I10 Essential (primary) hypertension: Secondary | ICD-10-CM | POA: Diagnosis not present

## 2020-05-26 DIAGNOSIS — H34831 Tributary (branch) retinal vein occlusion, right eye, with macular edema: Secondary | ICD-10-CM | POA: Diagnosis not present

## 2020-05-26 DIAGNOSIS — H35033 Hypertensive retinopathy, bilateral: Secondary | ICD-10-CM

## 2020-05-27 ENCOUNTER — Encounter (INDEPENDENT_AMBULATORY_CARE_PROVIDER_SITE_OTHER): Payer: Self-pay | Admitting: Ophthalmology

## 2020-05-27 DIAGNOSIS — E113313 Type 2 diabetes mellitus with moderate nonproliferative diabetic retinopathy with macular edema, bilateral: Secondary | ICD-10-CM

## 2020-05-27 DIAGNOSIS — H34831 Tributary (branch) retinal vein occlusion, right eye, with macular edema: Secondary | ICD-10-CM

## 2020-05-27 MED ORDER — BEVACIZUMAB CHEMO INJECTION 1.25MG/0.05ML SYRINGE FOR KALEIDOSCOPE
1.2500 mg | INTRAVITREAL | Status: AC | PRN
  Administered 2020-05-27: 1.25 mg via INTRAVITREAL

## 2020-06-21 NOTE — Progress Notes (Signed)
[facility] Note  06/23/2020     CHIEF COMPLAINT Patient presents for Retina Follow Up   HISTORY OF PRESENT ILLNESS: Thomas Finley is a 65 y.o. male who presents to the clinic today for:   HPI    Retina Follow Up    Patient presents with  Diabetic Retinopathy.  In both eyes.  This started months ago.  Severity is mild.  Duration of 4 weeks.  Since onset it is stable.  I, the attending physician,  performed the HPI with the patient and updated documentation appropriately.          Comments    65 y/o male pt here for 4 wk f/u for mod NPDR OU.  No change in [place] OU.  Denies pain, FOL, floaters.  No gtts.  BS unknown.  A1C 8.0.       Last edited by Bernarda Caffey, MD on 06/23/2020  1:05 PM. (History)    pt states vision is doing good  Referring physician: Kennieth Rad, MD [facility] [address] [place] [address]  HISTORICAL INFORMATION:   Selected notes from the MEDICAL RECORD NUMBER Referred by Dr. Monna Fam for concern of CRVO LEE:  Ocular Hx- PMH-    CURRENT MEDICATIONS: No current outpatient medications on file. (Ophthalmic Drugs)   No current facility-administered medications for this visit. (Ophthalmic Drugs)   Current Outpatient Medications (Other)  Medication Sig  . atorvastatin (LIPITOR) 10 MG tablet Take 10 mg by mouth daily.  . blood glucose meter kit and supplies KIT Dispense based on patient and insurance preference. Use up to four times daily as directed. (FOR ICD-10 E11.65).  . Blood Glucose Monitoring Suppl (ACCU-CHEK GUIDE) w/Device KIT 1 Piece by Does not apply route as directed.  Marland Kitchen buPROPion (WELLBUTRIN XL) 300 MG 24 hr tablet Take 300 mg by mouth daily.  . cephALEXin (KEFLEX) 500 MG capsule Take 500 mg by mouth 2 (two) times daily.  Marland Kitchen donepezil (ARICEPT) 5 MG tablet Take by mouth.  . doxycycline (ADOXA) 100 MG tablet Take 100 mg by mouth 2 (two) times daily. for 10 days  . Dulaglutide  (TRULICITY) 1.5 UL/8.4TX SOPN Inject into the skin once a week.  . escitalopram (LEXAPRO) 20 MG tablet Take by mouth.  Marland Kitchen FLUoxetine (PROZAC) 20 MG tablet daily.  Marland Kitchen glipiZIDE (GLUCOTROL) 10 MG tablet Take 5 mg by mouth 2 (two) times daily with a meal.  . glucose blood (ACCU-CHEK GUIDE) test strip Use as instructed  . glucose blood test strip 1 each by Other route as needed. Use as instructed 4 x daily. E11.65. One touch ultra  . hydrOXYzine (ATARAX/VISTARIL) 25 MG tablet Take 25 mg by mouth 2 (two) times daily.  . insulin degludec (TRESIBA FLEXTOUCH) 100 UNIT/ML SOPN FlexTouch Pen Inject 0.2 mLs (20 Units total) into the skin daily.  . Insulin Glargine-Lixisenatide 100-33 UNT-MCG/ML SOPN Inject into the skin.  . Insulin Pen Needle (BD PEN NEEDLE NANO U/F) 32G X 4 MM MISC 1 each by Does not apply route at bedtime.  . lansoprazole (PREVACID) 30 MG capsule Take by mouth.  . linagliptin (TRADJENTA) 5 MG TABS tablet Take 1 tablet (5 mg total) by mouth daily.  Marland Kitchen lisinopril (PRINIVIL,ZESTRIL) 2.5 MG tablet Take 2.5 mg by mouth daily.  . ondansetron (ZOFRAN-ODT) 4 MG disintegrating tablet Take by mouth.  . pioglitazone (ACTOS) 30 MG tablet Take by mouth.   No current facility-administered medications for this visit. (Other)  REVIEW OF SYSTEMS: ROS    Positive for: Endocrine, Eyes   Negative for: Constitutional, Gastrointestinal, Neurological, Skin, Genitourinary, Musculoskeletal, HENT, Cardiovascular, Respiratory, Psychiatric, Allergic/Imm, Heme/Lymph   Last edited by Matthew Folks, COA on 06/23/2020  9:26 AM. (History)       ALLERGIES No Known Allergies  PAST MEDICAL HISTORY Past Medical History:  Diagnosis Date  . Diabetes ([place])   . Diabetic retinopathy ([place])    NPDR OU  . Hypertension   . Hypertensive retinopathy    OU   Past Surgical History:  Procedure Laterality Date  . CATARACT EXTRACTION Bilateral    Dr. Herbert Deaner  . EYE SURGERY Bilateral    Cat Sx - Dr. Herbert Deaner     FAMILY HISTORY Family History  Problem Relation Age of Onset  . Diabetes Father   . Diabetes Sister   . Diabetes Brother     SOCIAL HISTORY Social History   Tobacco Use  . Smoking status: Never Smoker  . Smokeless tobacco: Never Used  Vaping Use  . Vaping Use: Never used  Substance Use Topics  . Alcohol use: Never  . Drug use: Never         OPHTHALMIC EXAM:  Base Eye Exam    Visual Acuity (Snellen - Linear)      Right Left   Dist [place] 20/30 -2 20/30 -2   Dist ph [place] 20/25 -2 20/25       Tonometry (Tonopen, 9:29 AM)      Right Left   Pressure 14 15       Pupils      Dark Light Shape React APD   Right 2 1 Round Minimal None   Left 2 1 Round Minimal None       Visual Fields (Counting fingers)      Left Right    Full Full       Extraocular Movement      Right Left    Full, Ortho Full, Ortho       Neuro/Psych    Oriented x3: Yes   Mood/Affect: Normal       Dilation    Both eyes: 1.0% Mydriacyl, 2.5% Phenylephrine @ 9:29 AM        Slit Lamp and Fundus Exam    Slit Lamp Exam      Right Left   Lids/Lashes Dermatochalasis - upper lid Dermatochalasis - upper lid   Conjunctiva/Sclera White and quiet White and quiet   Cornea 2+ Punctate epithelial erosions, well healed temporal cataract wounds trace Punctate epithelial erosions, mild arcus, well healed temporal cataract wounds   Anterior Chamber deep, clear, narrow temporal angle Deep and quiet   Iris Round and dilated, No NVI Round and dilated, No NVI   Lens PC IOL in good position PC IOL in good position   Vitreous Vitreous syneresis Vitreous syneresis       Fundus Exam      Right Left   Disc mild Pallor, Sharp rim, no NVD Pink and Sharp   C/D Ratio 0.2 0.4   Macula good foveal reflex, scattered MA/DBH/CWS, +edema - slightly improved good foveal reflex, scattered IRH/cystic changes, +edema - slightly improved, mild CWS   Vessels attenuated, Tortuous attenuated, Tortuous   Periphery Attached,  scattered IRH/DBH/CWS greatest posteriorly Attached, scattered IRH/DBH greatest nasal to disc           IMAGING AND PROCEDURES  Imaging and Procedures for 06/23/2020  OCT, Retina - OU - Both Eyes  Right Eye Quality was good. Central Foveal Thickness: 375. Progression has improved. Findings include abnormal foveal contour, intraretinal fluid, subretinal fluid, intraretinal hyper-reflective material (Mild interval improvement in IRF/SRF).   Left Eye Quality was good. Central Foveal Thickness: 381. Progression has improved. Findings include abnormal foveal contour, intraretinal fluid, no SRF, vitreomacular adhesion  (Mild interval improvement in IRF).   Notes *Images captured and stored on drive  Diagnosis / Impression:  DME OU OD: Mild interval improvement in IRF/SRF OS: Mild interval improvement in IRF  Clinical management:  See below  Abbreviations: NFP - Normal foveal profile. CME - cystoid macular edema. PED - pigment epithelial detachment. IRF - intraretinal fluid. SRF - subretinal fluid. EZ - ellipsoid zone. ERM - epiretinal membrane. ORA - outer retinal atrophy. ORT - outer retinal tubulation. SRHM - subretinal hyper-reflective material. IRHM - intraretinal hyper-reflective material        Intravitreal Injection, Pharmacologic Agent - OD - Right Eye       Time Out 06/23/2020. 10:20 AM. Confirmed correct patient, procedure, site, and patient consented.   Anesthesia Topical anesthesia was used. Anesthetic medications included Lidocaine 2%, Proparacaine 0.5%.   Procedure Preparation included eyelid speculum, 5% betadine to ocular surface. A supplied needle was used.   Injection:  1.25 mg Bevacizumab (AVASTIN) 1.8m/0.05mL SOLN   NDC: 572094-709-62 Lot: [id number] , Expiration date: 08/06/2020   Route: Intravitreal, Site: Right Eye, Waste: 0 mL  Post-op Post injection exam found visual acuity of at least counting fingers. The patient tolerated the procedure  well. There were no complications. The patient received written and verbal post procedure care education.        Intravitreal Injection, Pharmacologic Agent - OS - Left Eye       Time Out 06/23/2020. 10:23 AM. Confirmed correct patient, procedure, site, and patient consented.   Anesthesia Topical anesthesia was used. Anesthetic medications included Lidocaine 2%, Proparacaine 0.5%.   Procedure Preparation included 5% betadine to ocular surface, eyelid speculum. A (32g) needle was used.   Injection:  1.25 mg Bevacizumab (AVASTIN) 1.281m0.05mL SOLN   NDC: 5030mLot: [id number], Expiration date: 08/01/2020   Route: Intravitreal, Site: Left Eye, Waste: 0.05 mL  Post-op Post injection exam found visual acuity of at least counting fingers. The patient tolerated the procedure well. There were no complications. The patient received written and verbal post procedure care education. Post injection medications were not given.                 ASSESSMENT/PLAN:    ICD-10-CM   1. Moderate nonproliferative diabetic retinopathy of both eyes with macular edema associated with type 2 diabetes mellitus (HCC)  E118/10/2022ntravitreal Injection, Pharmacologic Agent - OD - Right Eye    Intravitreal Injection, Pharmacologic Agent - OS - Left Eye    Bevacizumab (AVASTIN) SOLN 1.25 mg    Bevacizumab (AVASTIN) SOLN 1.25 mg  2. Retinal edema  H35.81 OCT, Retina - OU - Both Eyes  3. Branch retinal vein occlusion of right eye with macular edema  H34.8310   4. Essential hypertension  I10   5. Hypertensive retinopathy of both eyes  H35.033   6. Pseudophakia, both eyes  Z96.1     1,2. Moderate non-proliferative diabetic retinopathy, OU (OD>OS)  - s/p IVA OD #1 (12.31.21)  - s/p IVA OS #1 (01.04.22)  - s/p IVA OU #2 (02.04.22), #3 (03.04.22) - exam shows scattered MA/DBH OU, central edema OU - BCVA dec to 20/25 from 20/20 OU  - OCT shows Mild interval improvement  in IRF/SRF OU - recommend IVA OU  #4 today, 04.01.22 - pt wishes to proceed with injection  - RBA of procedure discussed, questions answered  - Avastin informed consent obtained and signed (OU) - see procedure note - f/u in 4 weeks, DFE, OCT, possible injections  3. BRVO w/ CME OD  - s/p IVA OD x2 as above - BCVA 20/25 -- decreased - OCT shows +DME with BRVO component - recommend IVA OD #4 today as above - F/U 4 weeks -- DFE/OCT/possible injection  4,5. Hypertensive retinopathy OU - discussed importance of tight BP control - monitor  6. Pseudophakia OU  - s/p CE/IOL OU (Dr. Herbert Deaner)  - IOLs in good position, doing well - monitor   Ophthalmic Meds Ordered this visit:  Meds ordered this encounter  Medications  . Bevacizumab (AVASTIN) SOLN 1.25 mg  . Bevacizumab (AVASTIN) SOLN 1.25 mg       Return in about 4 weeks (around 07/21/2020) for f/u NPDR OU, DFE, OCT.  There are no Patient Instructions on file for this visit.   Explained the diagnoses, plan, and follow up with the patient and they expressed understanding.  Patient expressed understanding of the importance of proper follow up care.   This document serves as a record of services personally performed by Gardiner Sleeper, MD, PhD. It was created on their behalf by San Jetty. Owens Shark, OA an ophthalmic technician. The creation of this record is the provider's dictation and/or activities during the visit.    Electronically signed by: San Jetty. [place], [place] 03.30.2022 1:07 PM . Gardiner Sleeper, M.D., Ph.D. Diseases & Surgery of the Retina and Vitreous [facility]  I have reviewed the above documentation for accuracy and completeness, and I agree with the above. Gardiner Sleeper, M.D., Ph.D. 06/23/20 1:07 PM   Abbreviations: M myopia (nearsighted); A astigmatism; H hyperopia (farsighted); P presbyopia; Mrx spectacle prescription;  CTL contact lenses; OD right eye; OS left eye; OU both eyes  XT exotropia; ET esotropia; PEK punctate  epithelial keratitis; PEE punctate epithelial erosions; DES dry eye syndrome; MGD meibomian gland dysfunction; ATs artificial tears; PFAT's preservative free artificial tears; [place] nuclear sclerotic cataract; PSC posterior subcapsular cataract; ERM epi-retinal membrane; PVD posterior vitreous detachment; RD retinal detachment; DM diabetes mellitus; DR diabetic retinopathy; NPDR non-proliferative diabetic retinopathy; PDR proliferative diabetic retinopathy; CSME clinically significant macular edema; DME diabetic macular edema; dbh dot blot hemorrhages; CWS cotton wool spot; POAG primary open angle glaucoma; C/D cup-to-disc ratio; HVF humphrey visual field; GVF goldmann visual field; OCT optical coherence tomography; IOP intraocular pressure; BRVO Branch retinal vein occlusion; CRVO central retinal vein occlusion; CRAO central retinal artery occlusion; BRAO branch retinal artery occlusion; RT retinal tear; SB scleral buckle; PPV pars plana vitrectomy; VH Vitreous hemorrhage; PRP panretinal laser photocoagulation; IVK intravitreal kenalog; VMT vitreomacular traction; MH Macular hole;  NVD neovascularization of the disc; NVE neovascularization elsewhere; AREDS age related eye disease study; ARMD age related macular degeneration; POAG primary open angle glaucoma; EBMD epithelial/anterior basement membrane dystrophy; ACIOL anterior chamber intraocular lens; IOL intraocular lens; PCIOL posterior chamber intraocular lens; Phaco/IOL phacoemulsification with intraocular lens placement; [place] photorefractive keratectomy; LASIK laser assisted in situ keratomileusis; HTN hypertension; DM diabetes mellitus; COPD chronic obstructive pulmonary disease

## 2020-06-23 ENCOUNTER — Encounter (INDEPENDENT_AMBULATORY_CARE_PROVIDER_SITE_OTHER): Payer: Self-pay | Admitting: Ophthalmology

## 2020-06-23 ENCOUNTER — Other Ambulatory Visit: Payer: Self-pay

## 2020-06-23 ENCOUNTER — Ambulatory Visit (INDEPENDENT_AMBULATORY_CARE_PROVIDER_SITE_OTHER): Payer: Medicare Other | Admitting: Ophthalmology

## 2020-06-23 DIAGNOSIS — Z961 Presence of intraocular lens: Secondary | ICD-10-CM

## 2020-06-23 DIAGNOSIS — I10 Essential (primary) hypertension: Secondary | ICD-10-CM | POA: Diagnosis not present

## 2020-06-23 DIAGNOSIS — H34831 Tributary (branch) retinal vein occlusion, right eye, with macular edema: Secondary | ICD-10-CM | POA: Diagnosis not present

## 2020-06-23 DIAGNOSIS — E113313 Type 2 diabetes mellitus with moderate nonproliferative diabetic retinopathy with macular edema, bilateral: Secondary | ICD-10-CM

## 2020-06-23 DIAGNOSIS — H3581 Retinal edema: Secondary | ICD-10-CM | POA: Diagnosis not present

## 2020-06-23 DIAGNOSIS — H35033 Hypertensive retinopathy, bilateral: Secondary | ICD-10-CM

## 2020-06-23 MED ORDER — BEVACIZUMAB CHEMO INJECTION 1.25MG/0.05ML SYRINGE FOR KALEIDOSCOPE
1.2500 mg | INTRAVITREAL | Status: AC | PRN
  Administered 2020-06-23: 1.25 mg via INTRAVITREAL

## 2020-07-19 NOTE — Progress Notes (Shared)
[facility] Note  07/21/2020     CHIEF COMPLAINT Patient presents for No chief complaint on file.   HISTORY OF PRESENT ILLNESS: Thomas Finley is a 65 y.o. male who presents to the clinic today for:   pt states vision is doing good  Referring physician: Kennieth Rad, MD [facility] [address] [place] [address]  HISTORICAL INFORMATION:   Selected notes from the MEDICAL RECORD NUMBER Referred by Dr. Monna Fam for concern of CRVO LEE:  Ocular Hx- PMH-    CURRENT MEDICATIONS: No current outpatient medications on file. (Ophthalmic Drugs)   No current facility-administered medications for this visit. (Ophthalmic Drugs)   Current Outpatient Medications (Other)  Medication Sig  . atorvastatin (LIPITOR) 10 MG tablet Take 10 mg by mouth daily.  . blood glucose meter kit and supplies KIT Dispense based on patient and insurance preference. Use up to four times daily as directed. (FOR ICD-10 E11.65).  . Blood Glucose Monitoring Suppl (ACCU-CHEK GUIDE) w/Device KIT 1 Piece by Does not apply route as directed.  Marland Kitchen buPROPion (WELLBUTRIN XL) 300 MG 24 hr tablet Take 300 mg by mouth daily.  . cephALEXin (KEFLEX) 500 MG capsule Take 500 mg by mouth 2 (two) times daily.  Marland Kitchen donepezil (ARICEPT) 5 MG tablet Take by mouth.  . doxycycline (ADOXA) 100 MG tablet Take 100 mg by mouth 2 (two) times daily. for 10 days  . Dulaglutide (TRULICITY) 1.5 MC/8.0EM SOPN Inject into the skin once a week.  . escitalopram (LEXAPRO) 20 MG tablet Take by mouth.  Marland Kitchen FLUoxetine (PROZAC) 20 MG tablet daily.  Marland Kitchen glipiZIDE (GLUCOTROL) 10 MG tablet Take 5 mg by mouth 2 (two) times daily with a meal.  . glucose blood (ACCU-CHEK GUIDE) test strip Use as instructed  . glucose blood test strip 1 each by Other route as needed. Use as instructed 4 x daily. E11.65. One touch ultra  . hydrOXYzine (ATARAX/VISTARIL) 25 MG tablet Take 25 mg by mouth 2 (two) times daily.  .  insulin degludec (TRESIBA FLEXTOUCH) 100 UNIT/ML SOPN FlexTouch Pen Inject 0.2 mLs (20 Units total) into the skin daily.  . Insulin Glargine-Lixisenatide 100-33 UNT-MCG/ML SOPN Inject into the skin.  . Insulin Pen Needle (BD PEN NEEDLE NANO U/F) 32G X 4 MM MISC 1 each by Does not apply route at bedtime.  . lansoprazole (PREVACID) 30 MG capsule Take by mouth.  . linagliptin (TRADJENTA) 5 MG TABS tablet Take 1 tablet (5 mg total) by mouth daily.  Marland Kitchen lisinopril (PRINIVIL,ZESTRIL) 2.5 MG tablet Take 2.5 mg by mouth daily.  . pioglitazone (ACTOS) 30 MG tablet Take by mouth.   No current facility-administered medications for this visit. (Other)      REVIEW OF SYSTEMS:    ALLERGIES No Known Allergies  PAST MEDICAL HISTORY Past Medical History:  Diagnosis Date  . Diabetes ([place])   . Diabetic retinopathy ([place])    NPDR OU  . Hypertension   . Hypertensive retinopathy    OU   Past Surgical History:  Procedure Laterality Date  . CATARACT EXTRACTION Bilateral    Dr. Herbert Deaner  . EYE SURGERY Bilateral    Cat Sx - Dr. Herbert Deaner    FAMILY HISTORY Family History  Problem Relation Age of Onset  . Diabetes Father   . Diabetes Sister   . Diabetes Brother     SOCIAL HISTORY Social History   Tobacco Use  . Smoking status: Never Smoker  . Smokeless tobacco: Never  Used  Vaping Use  . Vaping Use: Never used  Substance Use Topics  . Alcohol use: Never  . Drug use: Never         OPHTHALMIC EXAM:  Not recorded     IMAGING AND PROCEDURES  Imaging and Procedures for 07/21/2020           ASSESSMENT/PLAN:    ICD-10-CM   1. Moderate nonproliferative diabetic retinopathy of both eyes with macular edema associated with type 2 diabetes mellitus ([name])  Y70.6237   2. Retinal edema  H35.81   3. Branch retinal vein occlusion of right eye with macular edema  H34.8310   4. Essential hypertension  I10   5. Hypertensive retinopathy of both eyes  H35.033   6. Pseudophakia, both eyes   Z96.1   7. 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 Moderate non-proliferative diabetic retinopathy, OU (OD>OS)  - s/p IVA OD #1 (12.31.21)  - s/p IVA OS #1 (01.04.22)  - s/p IVA OU #2 (02.04.22), #3 (03.04.22), #4 (04.01.22 - exam shows scattered MA/DBH OU, central edema OU - BCVA dec to 20/25 from 20/20 OU  - OCT shows Mild interval improvement in IRF/SRF OU - recommend IVA OU #5 today, 04.29.22 - pt wishes to proceed with injection  - RBA of procedure discussed, questions answered  - Avastin informed consent obtained and signed (OU) - see procedure note - f/u in 4 weeks, DFE, OCT, possible injections  3. BRVO w/ CME OD  - s/p IVA OD x2 as above - BCVA 20/25 -- decreased - OCT shows +DME with BRVO component - recommend IVA OD #5 today as above - F/U 4 weeks -- DFE/OCT/possible injection  4,5. Hypertensive retinopathy OU - discussed importance of tight BP control - monitor  6. Pseudophakia OU  - s/p CE/IOL OU (Dr. Herbert Deaner)  - IOLs in good position, doing well - monitor   Ophthalmic Meds Ordered this visit:  No orders of the defined types were placed in this encounter.      No follow-ups on file.  There are no Patient Instructions on file for this visit.   Explained the diagnoses, plan, and follow up with the patient and they expressed understanding.  Patient expressed understanding of the importance of proper follow up care.   This document serves as a record of services personally performed by Gardiner Sleeper, MD, PhD. It was created on their behalf by San Jetty. Owens Shark, OA an ophthalmic technician. The creation of this record is the provider's dictation and/or activities during the visit.    Electronically signed by: San Jetty. Owens Shark, [place] 04.27.2022 8:16 AM . Gardiner Sleeper, M.D., Ph.D. Diseases & Surgery of the Retina and Vitreous [facility]    Abbreviations: M myopia  (nearsighted); A astigmatism; H hyperopia (farsighted); P presbyopia; Mrx spectacle prescription;  CTL contact lenses; OD right eye; OS left eye; OU both eyes  XT exotropia; ET esotropia; PEK punctate epithelial keratitis; PEE punctate epithelial erosions; DES dry eye syndrome; MGD meibomian gland dysfunction; ATs artificial tears; PFAT's preservative free artificial tears; [place] nuclear sclerotic cataract; PSC posterior subcapsular cataract; ERM epi-retinal membrane; PVD posterior vitreous detachment; RD retinal detachment; DM diabetes mellitus; DR diabetic retinopathy; NPDR non-proliferative diabetic retinopathy; PDR proliferative diabetic retinopathy; CSME clinically significant macular edema; DME diabetic macular edema; dbh dot blot hemorrhages; CWS cotton wool spot; POAG primary open angle glaucoma; C/D cup-to-disc ratio; HVF humphrey  visual field; GVF goldmann visual field; OCT optical coherence tomography; IOP intraocular pressure; BRVO Branch retinal vein occlusion; CRVO central retinal vein occlusion; CRAO central retinal artery occlusion; BRAO branch retinal artery occlusion; RT retinal tear; SB scleral buckle; PPV pars plana vitrectomy; VH Vitreous hemorrhage; PRP panretinal laser photocoagulation; IVK intravitreal kenalog; VMT vitreomacular traction; MH Macular hole;  NVD neovascularization of the disc; NVE neovascularization elsewhere; AREDS age related eye disease study; ARMD age related macular degeneration; POAG primary open angle glaucoma; EBMD epithelial/anterior basement membrane dystrophy; ACIOL anterior chamber intraocular lens; IOL intraocular lens; PCIOL posterior chamber intraocular lens; Phaco/IOL phacoemulsification with intraocular lens placement; [place] photorefractive keratectomy; LASIK laser assisted in situ keratomileusis; HTN hypertension; DM diabetes mellitus; COPD chronic obstructive pulmonary disease

## 2020-07-21 ENCOUNTER — Encounter (INDEPENDENT_AMBULATORY_CARE_PROVIDER_SITE_OTHER): Payer: Medicare Other | Admitting: Ophthalmology

## 2020-07-21 DIAGNOSIS — H35033 Hypertensive retinopathy, bilateral: Secondary | ICD-10-CM

## 2020-07-21 DIAGNOSIS — Z961 Presence of intraocular lens: Secondary | ICD-10-CM

## 2020-07-21 DIAGNOSIS — H34831 Tributary (branch) retinal vein occlusion, right eye, with macular edema: Secondary | ICD-10-CM

## 2020-07-21 DIAGNOSIS — I10 Essential (primary) hypertension: Secondary | ICD-10-CM

## 2020-07-21 DIAGNOSIS — H3581 Retinal edema: Secondary | ICD-10-CM

## 2020-07-21 DIAGNOSIS — E113313 Type 2 diabetes mellitus with moderate nonproliferative diabetic retinopathy with macular edema, bilateral: Secondary | ICD-10-CM

## 2020-07-25 NOTE — Progress Notes (Shared)
[facility] Note  07/26/2020     CHIEF COMPLAINT Patient presents for No chief complaint on file.   HISTORY OF PRESENT ILLNESS: Thomas Finley is a 65 y.o. male who presents to the clinic today for:   pt states vision is doing good  Referring physician: Kennieth Rad, MD [facility] [address] [place] [address]  HISTORICAL INFORMATION:   Selected notes from the MEDICAL RECORD NUMBER Referred by Dr. Monna Fam for concern of CRVO LEE:  Ocular Hx- PMH-    CURRENT MEDICATIONS: No current outpatient medications on file. (Ophthalmic Drugs)   No current facility-administered medications for this visit. (Ophthalmic Drugs)   Current Outpatient Medications (Other)  Medication Sig  . atorvastatin (LIPITOR) 10 MG tablet Take 10 mg by mouth daily.  . blood glucose meter kit and supplies KIT Dispense based on patient and insurance preference. Use up to four times daily as directed. (FOR ICD-10 E11.65).  . Blood Glucose Monitoring Suppl (ACCU-CHEK GUIDE) w/Device KIT 1 Piece by Does not apply route as directed.  Marland Kitchen buPROPion (WELLBUTRIN XL) 300 MG 24 hr tablet Take 300 mg by mouth daily.  . cephALEXin (KEFLEX) 500 MG capsule Take 500 mg by mouth 2 (two) times daily.  Marland Kitchen donepezil (ARICEPT) 5 MG tablet Take by mouth.  . doxycycline (ADOXA) 100 MG tablet Take 100 mg by mouth 2 (two) times daily. for 10 days  . Dulaglutide (TRULICITY) 1.5 HU/7.6LY SOPN Inject into the skin once a week.  . escitalopram (LEXAPRO) 20 MG tablet Take by mouth.  Marland Kitchen FLUoxetine (PROZAC) 20 MG tablet daily.  Marland Kitchen glipiZIDE (GLUCOTROL) 10 MG tablet Take 5 mg by mouth 2 (two) times daily with a meal.  . glucose blood (ACCU-CHEK GUIDE) test strip Use as instructed  . glucose blood test strip 1 each by Other route as needed. Use as instructed 4 x daily. E11.65. One touch ultra  . hydrOXYzine (ATARAX/VISTARIL) 25 MG tablet Take 25 mg by mouth 2 (two) times daily.  .  insulin degludec (TRESIBA FLEXTOUCH) 100 UNIT/ML SOPN FlexTouch Pen Inject 0.2 mLs (20 Units total) into the skin daily.  . Insulin Glargine-Lixisenatide 100-33 UNT-MCG/ML SOPN Inject into the skin.  . Insulin Pen Needle (BD PEN NEEDLE NANO U/F) 32G X 4 MM MISC 1 each by Does not apply route at bedtime.  . lansoprazole (PREVACID) 30 MG capsule Take by mouth.  . linagliptin (TRADJENTA) 5 MG TABS tablet Take 1 tablet (5 mg total) by mouth daily.  Marland Kitchen lisinopril (PRINIVIL,ZESTRIL) 2.5 MG tablet Take 2.5 mg by mouth daily.  . pioglitazone (ACTOS) 30 MG tablet Take by mouth.   No current facility-administered medications for this visit. (Other)      REVIEW OF SYSTEMS:    ALLERGIES No Known Allergies  PAST MEDICAL HISTORY Past Medical History:  Diagnosis Date  . Diabetes ([place])   . Diabetic retinopathy ([place])    NPDR OU  . Hypertension   . Hypertensive retinopathy    OU   Past Surgical History:  Procedure Laterality Date  . CATARACT EXTRACTION Bilateral    Dr. Herbert Deaner  . EYE SURGERY Bilateral    Cat Sx - Dr. Herbert Deaner    FAMILY HISTORY Family History  Problem Relation Age of Onset  . Diabetes Father   . Diabetes Sister   . Diabetes Brother     SOCIAL HISTORY Social History   Tobacco Use  . Smoking status: Never Smoker  . Smokeless tobacco: Never  Used  Vaping Use  . Vaping Use: Never used  Substance Use Topics  . Alcohol use: Never  . Drug use: Never         OPHTHALMIC EXAM:  Not recorded     IMAGING AND PROCEDURES  Imaging and Procedures for 07/26/2020           ASSESSMENT/PLAN:  No diagnosis found.  1,2. Moderate non-proliferative diabetic retinopathy, OU (OD>OS)  - s/p IVA OD #1 (12.31.21)  - s/p IVA OS #1 (01.04.22)  - s/p IVA OU #2 (02.04.22), #3 (03.04.22), #4 (04.01.22) - exam shows scattered MA/DBH OU, central edema OU - BCVA dec to 20/25 from 20/20 OU  - OCT shows Mild interval improvement in IRF/SRF OU - recommend IVA OU #5 today,  05.04.22 - pt wishes to proceed with injection  - RBA of procedure discussed, questions answered  - Avastin informed consent obtained and signed (OU) - see procedure note - f/u in 4 weeks, DFE, OCT, possible injections  3. BRVO w/ CME OD  - s/p IVA OD x2 as above - BCVA 20/25 -- decreased - OCT shows +DME with BRVO component - recommend IVA OD #5 today as above - F/U 4 weeks DFE, OCT, possible injection  4,5. Hypertensive retinopathy OU - discussed importance of tight BP control - monitor  6. Pseudophakia OU  - s/p CE/IOL OU (Dr. Herbert Deaner)  - IOLs in good position, doing well - monitor   Ophthalmic Meds Ordered this visit:  No orders of the defined types were placed in this encounter.      No follow-ups on file.  There are no Patient Instructions on file for this visit.   Explained the diagnoses, plan, and follow up with the patient and they expressed understanding.  Patient expressed understanding of the importance of proper follow up care.   This document serves as a record of services personally performed by Gardiner Sleeper, MD, PhD. It was created on their behalf by Roselee Nova, COMT. The creation of this record is the provider's dictation and/or activities during the visit.  Electronically signed by: Roselee Nova, COMT 07/25/20 11:06 AM   . Gardiner Sleeper, M.D., Ph.D. Diseases & Surgery of the Retina and Vitreous [facility]    Abbreviations: M myopia (nearsighted); A astigmatism; H hyperopia (farsighted); P presbyopia; Mrx spectacle prescription;  CTL contact lenses; OD right eye; OS left eye; OU both eyes  XT exotropia; ET esotropia; PEK punctate epithelial keratitis; PEE punctate epithelial erosions; DES dry eye syndrome; MGD meibomian gland dysfunction; ATs artificial tears; PFAT's preservative free artificial tears; [place] nuclear sclerotic cataract; PSC posterior subcapsular cataract; ERM epi-retinal membrane; PVD posterior vitreous  detachment; RD retinal detachment; DM diabetes mellitus; DR diabetic retinopathy; NPDR non-proliferative diabetic retinopathy; PDR proliferative diabetic retinopathy; CSME clinically significant macular edema; DME diabetic macular edema; dbh dot blot hemorrhages; CWS cotton wool spot; POAG primary open angle glaucoma; C/D cup-to-disc ratio; HVF humphrey visual field; GVF goldmann visual field; OCT optical coherence tomography; IOP intraocular pressure; BRVO Branch retinal vein occlusion; CRVO central retinal vein occlusion; CRAO central retinal artery occlusion; BRAO branch retinal artery occlusion; RT retinal tear; SB scleral buckle; PPV pars plana vitrectomy; VH Vitreous hemorrhage; PRP panretinal laser photocoagulation; IVK intravitreal kenalog; VMT vitreomacular traction; MH Macular hole;  NVD neovascularization of the disc; NVE neovascularization elsewhere; AREDS age related eye disease study; ARMD age related macular degeneration; POAG primary open angle glaucoma; EBMD epithelial/anterior basement membrane dystrophy; ACIOL anterior chamber intraocular  lens; IOL intraocular lens; PCIOL posterior chamber intraocular lens; Phaco/IOL phacoemulsification with intraocular lens placement; [place] photorefractive keratectomy; LASIK laser assisted in situ keratomileusis; HTN hypertension; DM diabetes mellitus; COPD chronic obstructive pulmonary disease

## 2020-07-26 ENCOUNTER — Encounter (INDEPENDENT_AMBULATORY_CARE_PROVIDER_SITE_OTHER): Payer: Medicare Other | Admitting: Ophthalmology

## 2020-07-26 NOTE — Progress Notes (Signed)
[facility] Note  07/27/2020     CHIEF COMPLAINT Patient presents for Retina Follow Up   HISTORY OF PRESENT ILLNESS: Thomas Finley is a 65 y.o. male who presents to the clinic today for:   HPI    Retina Follow Up    Patient presents with  Diabetic Retinopathy.  In both eyes.  This started 5 weeks ago.  Since onset it is stable.  I, the attending physician,  performed the HPI with the patient and updated documentation appropriately.          Comments    Pt here for 5 week retinal follow up exu NPDR OU. Pt states vision is the same, no changes he can tell. No ocular pain or discomfort noted. Unsure what his blood sugar was this am.        Last edited by Bernarda Caffey, MD on 07/31/2020  7:48 AM. (History)    pt states vision is stable  Referring physician: Kennieth Rad, MD [facility] [address] [place] [address]  HISTORICAL INFORMATION:   Selected notes from the MEDICAL RECORD NUMBER Referred by Dr. Monna Fam for concern of CRVO LEE:  Ocular Hx- PMH-    CURRENT MEDICATIONS: No current outpatient medications on file. (Ophthalmic Drugs)   No current facility-administered medications for this visit. (Ophthalmic Drugs)   Current Outpatient Medications (Other)  Medication Sig  . atorvastatin (LIPITOR) 10 MG tablet Take 10 mg by mouth daily.  . blood glucose meter kit and supplies KIT Dispense based on patient and insurance preference. Use up to four times daily as directed. (FOR ICD-10 E11.65).  . Blood Glucose Monitoring Suppl (ACCU-CHEK GUIDE) w/Device KIT 1 Piece by Does not apply route as directed.  Marland Kitchen buPROPion (WELLBUTRIN XL) 300 MG 24 hr tablet Take 300 mg by mouth daily.  . cephALEXin (KEFLEX) 500 MG capsule Take 500 mg by mouth 2 (two) times daily.  Marland Kitchen donepezil (ARICEPT) 5 MG tablet Take by mouth.  . doxycycline (ADOXA) 100 MG tablet Take 100 mg by mouth 2 (two) times daily. for 10 days  . Dulaglutide  (TRULICITY) 1.5 LY/6.5KP SOPN Inject into the skin once a week.  . escitalopram (LEXAPRO) 20 MG tablet Take by mouth.  Marland Kitchen FLUoxetine (PROZAC) 20 MG tablet daily.  Marland Kitchen glipiZIDE (GLUCOTROL) 10 MG tablet Take 5 mg by mouth 2 (two) times daily with a meal.  . glucose blood (ACCU-CHEK GUIDE) test strip Use as instructed  . glucose blood test strip 1 each by Other route as needed. Use as instructed 4 x daily. E11.65. One touch ultra  . hydrOXYzine (ATARAX/VISTARIL) 25 MG tablet Take 25 mg by mouth 2 (two) times daily.  . insulin degludec (TRESIBA FLEXTOUCH) 100 UNIT/ML SOPN FlexTouch Pen Inject 0.2 mLs (20 Units total) into the skin daily.  . Insulin Glargine-Lixisenatide 100-33 UNT-MCG/ML SOPN Inject into the skin.  . Insulin Pen Needle (BD PEN NEEDLE NANO U/F) 32G X 4 MM MISC 1 each by Does not apply route at bedtime.  . lansoprazole (PREVACID) 30 MG capsule Take by mouth.  . linagliptin (TRADJENTA) 5 MG TABS tablet Take 1 tablet (5 mg total) by mouth daily.  Marland Kitchen lisinopril (PRINIVIL,ZESTRIL) 2.5 MG tablet Take 2.5 mg by mouth daily.  . pioglitazone (ACTOS) 30 MG tablet Take by mouth.   No current facility-administered medications for this visit. (Other)      REVIEW OF SYSTEMS: ROS    Positive for: Endocrine, Eyes  Negative for: Constitutional, Gastrointestinal, Neurological, Skin, Genitourinary, Musculoskeletal, HENT, Cardiovascular, Respiratory, Psychiatric, Allergic/Imm, Heme/Lymph   Last edited by Kingsley Spittle, COT on 07/27/2020  9:49 AM. (History)       ALLERGIES No Known Allergies  PAST MEDICAL HISTORY Past Medical History:  Diagnosis Date  . Diabetes ([place])   . Diabetic retinopathy ([place])    NPDR OU  . Hypertension   . Hypertensive retinopathy    OU   Past Surgical History:  Procedure Laterality Date  . CATARACT EXTRACTION Bilateral    Dr. Herbert Deaner  . EYE SURGERY Bilateral    Cat Sx - Dr. Herbert Deaner    FAMILY HISTORY Family History  Problem Relation Age of Onset  .  Diabetes Father   . Diabetes Sister   . Diabetes Brother     SOCIAL HISTORY Social History   Tobacco Use  . Smoking status: Never Smoker  . Smokeless tobacco: Never Used  Vaping Use  . Vaping Use: Never used  Substance Use Topics  . Alcohol use: Never  . Drug use: Never         OPHTHALMIC EXAM:  Base Eye Exam    Visual Acuity (Snellen - Linear)      Right Left   Dist [place] 20/25 -2 20/25 +2   Dist ph [place] NI NI       Tonometry (Tonopen, 9:57 AM)      Right Left   Pressure 14 13       Pupils      Dark Light Shape React APD   Right 2 1 Round Minimal None   Left 2 1 Round Minimal None       Visual Fields (Counting fingers)      Left Right    Full Full       Extraocular Movement      Right Left    Full, Ortho Full, Ortho       Neuro/Psych    Oriented x3: Yes   Mood/Affect: Normal       Dilation    Both eyes: 1.0% Mydriacyl, 2.5% Phenylephrine @ 9:57 AM        Slit Lamp and Fundus Exam    Slit Lamp Exam      Right Left   Lids/Lashes Dermatochalasis - upper lid Dermatochalasis - upper lid   Conjunctiva/Sclera White and quiet White and quiet   Cornea 2+ Punctate epithelial erosions, well healed temporal cataract wounds trace Punctate epithelial erosions, mild arcus, well healed temporal cataract wounds   Anterior Chamber deep, clear, narrow temporal angle Deep and quiet   Iris Round and dilated, No NVI Round and dilated, No NVI   Lens PC IOL in good position PC IOL in good position   Vitreous Vitreous syneresis Vitreous syneresis       Fundus Exam      Right Left   Disc mild Pallor, Sharp rim, no NVD Pink and Sharp   C/D Ratio 0.2 0.4   Macula good foveal reflex, scattered MA/DBH/CWS, +edema - slightly improved good foveal reflex, scattered IRH -- improved, +cystic changes, +edema - slightly improved, mild CWS   Vessels attenuated, Tortuous attenuated, Tortuous   Periphery Attached, scattered IRH/DBH/CWS greatest posteriorly Attached, scattered  IRH/DBH greatest nasal to disc           IMAGING AND PROCEDURES  Imaging and Procedures for 07/27/2020  OCT, Retina - OU - Both Eyes       Right Eye Quality was good. Central Foveal Thickness: 358.  Progression has improved. Findings include abnormal foveal contour, intraretinal fluid, subretinal fluid, intraretinal hyper-reflective material (persistent IRF, interval improvement in SRF -- resolved).   Left Eye Quality was good. Central Foveal Thickness: 329. Progression has improved. Findings include abnormal foveal contour, intraretinal fluid, no SRF, vitreomacular adhesion  (interval improvement in IRF).   Notes *Images captured and stored on drive  Diagnosis / Impression:  DME OU OD: persistent IRF; interval improvement in SRF -- resolved OS: interval improvement in IRF  Clinical management:  See below  Abbreviations: NFP - Normal foveal profile. CME - cystoid macular edema. PED - pigment epithelial detachment. IRF - intraretinal fluid. SRF - subretinal fluid. EZ - ellipsoid zone. ERM - epiretinal membrane. ORA - outer retinal atrophy. ORT - outer retinal tubulation. SRHM - subretinal hyper-reflective material. IRHM - intraretinal hyper-reflective material        Intravitreal Injection, Pharmacologic Agent - OD - Right Eye       Time Out 07/27/2020. 10:43 AM. Confirmed correct patient, procedure, site, and patient consented.   Anesthesia Topical anesthesia was used. Anesthetic medications included Lidocaine 2%, Proparacaine 0.5%.   Procedure Preparation included eyelid speculum, 5% betadine to ocular surface. A supplied needle was used.   Injection:  1.25 mg Bevacizumab (AVASTIN) 1.57m/0.05mL SOLN   NDC: 594765-465-03 Lot:: [id number] Expiration date: 09/04/2020   Route: Intravitreal, Site: Right Eye, Waste: 0 mL  Post-op Post injection exam found visual acuity of at least counting fingers. The patient tolerated the procedure well. There were no complications. The  patient received written and verbal post procedure care education.        Intravitreal Injection, Pharmacologic Agent - OS - Left Eye       Time Out 07/27/2020. 10:44 AM. Confirmed correct patient, procedure, site, and patient consented.   Anesthesia Topical anesthesia was used. Anesthetic medications included Lidocaine 2%, Proparacaine 0.5%.   Procedure Preparation included 5% betadine to ocular surface, eyelid speculum. A (32g) needle was used.   Injection:  1.25 mg Bevacizumab (AVASTIN) 1.217m0.05mL SOLN   NDC: 5027517-001-74Lot: : [id number] date: 08/13/2020   Route: Intravitreal, Site: Left Eye, Waste: 0.05 mL  Post-op Post injection exam found visual acuity of at least counting fingers. The patient tolerated the procedure well. There were no complications. The patient received written and verbal post procedure care education. Post injection medications were not given.                 ASSESSMENT/PLAN:    ICD-10-CM   1. Moderate nonproliferative diabetic retinopathy of both eyes with macular edema associated with type 2 diabetes mellitus (HCC)  E1F16.3846ntravitreal Injection, Pharmacologic Agent - OD - Right Eye    Intravitreal Injection, Pharmacologic Agent - OS - Left Eye    Bevacizumab (AVASTIN) SOLN 1.25 mg    Bevacizumab (AVASTIN) SOLN 1.25 mg  2. Retinal edema  H35.81 OCT, Retina - OU - Both Eyes  3. Branch retinal vein occlusion of right eye with macular edema  H34.8310   4. Essential hypertension  I10   5. Hypertensive retinopathy of both eyes  H35.033   6. Pseudophakia, both eyes  Z96.1     1,2. Moderate non-proliferative diabetic retinopathy, OU (OD>OS)  - s/p IVA OD #1 (12.31.21)  - s/p IVA OS #1 (01.04.22)  - s/p IVA OU #2 (02.04.22), #3 (03.04.22), #4 (04.01.22) - exam shows scattered MA/DBH OU, central edema OU - BCVA stable at 20/25 OU  - OCT shows Mild interval improvement in IRF/SRF OU - recommend  IVA OU #5 today, 05.05.22 - pt wishes  to proceed with injection  - RBA of procedure discussed, questions answered  - Avastin informed consent obtained and signed (OU) - see procedure note - f/u in 4 weeks, DFE, OCT, possible injections  3. BRVO w/ CME OD  - s/p IVA OD x2 as above - BCVA 20/25 -- decreased - OCT shows +DME with BRVO component - recommend IVA OD #5 today as above - F/U 4 weeks -- DFE/OCT/possible injection  4,5. Hypertensive retinopathy OU - discussed importance of tight BP control - monitor  6. Pseudophakia OU  - s/p CE/IOL OU (Dr. Herbert Deaner)  - IOLs in good position, doing well - monitor   Ophthalmic Meds Ordered this visit:  Meds ordered this encounter  Medications  . Bevacizumab (AVASTIN) SOLN 1.25 mg  . Bevacizumab (AVASTIN) SOLN 1.25 mg       Return in about 4 weeks (around 08/24/2020) for f/u NPDR OU, DFE, OCT.  There are no Patient Instructions on file for this visit.   Explained the diagnoses, plan, and follow up with the patient and they expressed understanding.  Patient expressed understanding of the importance of proper follow up care.   This document serves as a record of services personally performed by Gardiner Sleeper, MD, PhD. It was created on their behalf by Leonie Douglas, an ophthalmic technician. The creation of this record is the provider's dictation and/or activities during the visit.    Electronically signed by: Leonie Douglas COA, 07/31/20  7:50 AM   This document serves as a record of services personally performed by Gardiner Sleeper, MD, PhD. It was created on their behalf by San Jetty. Owens Shark, OA an ophthalmic technician. The creation of this record is the provider's dictation and/or activities during the visit.    Electronically signed by: San Jetty. Owens Shark, [place] 05.05.2022 7:50 AM  . Gardiner Sleeper, M.D., Ph.D. Diseases & Surgery of the Retina and Vitreous [facility] 07/27/20  I have reviewed the above documentation for accuracy and completeness,  and I agree with the above. Gardiner Sleeper, M.D., Ph.D. 07/31/20 7:53 AM  Abbreviations: M myopia (nearsighted); A astigmatism; H hyperopia (farsighted); P presbyopia; Mrx spectacle prescription;  CTL contact lenses; OD right eye; OS left eye; OU both eyes  XT exotropia; ET esotropia; PEK punctate epithelial keratitis; PEE punctate epithelial erosions; DES dry eye syndrome; MGD meibomian gland dysfunction; ATs artificial tears; PFAT's preservative free artificial tears; [place] nuclear sclerotic cataract; PSC posterior subcapsular cataract; ERM epi-retinal membrane; PVD posterior vitreous detachment; RD retinal detachment; DM diabetes mellitus; DR diabetic retinopathy; NPDR non-proliferative diabetic retinopathy; PDR proliferative diabetic retinopathy; CSME clinically significant macular edema; DME diabetic macular edema; dbh dot blot hemorrhages; CWS cotton wool spot; POAG primary open angle glaucoma; C/D cup-to-disc ratio; HVF humphrey visual field; GVF goldmann visual field; OCT optical coherence tomography; IOP intraocular pressure; BRVO Branch retinal vein occlusion; CRVO central retinal vein occlusion; CRAO central retinal artery occlusion; BRAO branch retinal artery occlusion; RT retinal tear; SB scleral buckle; PPV pars plana vitrectomy; VH Vitreous hemorrhage; PRP panretinal laser photocoagulation; IVK intravitreal kenalog; VMT vitreomacular traction; MH Macular hole;  NVD neovascularization of the disc; NVE neovascularization elsewhere; AREDS age related eye disease study; ARMD age related macular degeneration; POAG primary open angle glaucoma; EBMD epithelial/anterior basement membrane dystrophy; ACIOL anterior chamber intraocular lens; IOL intraocular lens; PCIOL posterior chamber intraocular lens; Phaco/IOL phacoemulsification with intraocular lens placement; PRK photorefractive keratectomy; LASIK laser assisted in  situ keratomileusis; HTN hypertension; DM diabetes mellitus; COPD chronic obstructive  pulmonary disease

## 2020-07-27 ENCOUNTER — Other Ambulatory Visit: Payer: Self-pay

## 2020-07-27 ENCOUNTER — Ambulatory Visit (INDEPENDENT_AMBULATORY_CARE_PROVIDER_SITE_OTHER): Payer: Medicare Other | Admitting: Ophthalmology

## 2020-07-27 ENCOUNTER — Encounter (INDEPENDENT_AMBULATORY_CARE_PROVIDER_SITE_OTHER): Payer: Self-pay | Admitting: Ophthalmology

## 2020-07-27 DIAGNOSIS — I10 Essential (primary) hypertension: Secondary | ICD-10-CM

## 2020-07-27 DIAGNOSIS — H35033 Hypertensive retinopathy, bilateral: Secondary | ICD-10-CM

## 2020-07-27 DIAGNOSIS — H34831 Tributary (branch) retinal vein occlusion, right eye, with macular edema: Secondary | ICD-10-CM

## 2020-07-27 DIAGNOSIS — H3581 Retinal edema: Secondary | ICD-10-CM

## 2020-07-27 DIAGNOSIS — Z961 Presence of intraocular lens: Secondary | ICD-10-CM

## 2020-07-27 DIAGNOSIS — E113313 Type 2 diabetes mellitus with moderate nonproliferative diabetic retinopathy with macular edema, bilateral: Secondary | ICD-10-CM | POA: Diagnosis not present

## 2020-07-31 ENCOUNTER — Encounter (INDEPENDENT_AMBULATORY_CARE_PROVIDER_SITE_OTHER): Payer: Self-pay | Admitting: Ophthalmology

## 2020-07-31 DIAGNOSIS — E113313 Type 2 diabetes mellitus with moderate nonproliferative diabetic retinopathy with macular edema, bilateral: Secondary | ICD-10-CM | POA: Diagnosis not present

## 2020-07-31 MED ORDER — BEVACIZUMAB CHEMO INJECTION 1.25MG/0.05ML SYRINGE FOR KALEIDOSCOPE
1.2500 mg | INTRAVITREAL | Status: AC | PRN
  Administered 2020-07-31: 1.25 mg via INTRAVITREAL

## 2020-08-01 ENCOUNTER — Telehealth: Payer: Self-pay

## 2020-08-01 NOTE — Telephone Encounter (Signed)
Attempted to contact patient's caregiver @ [phone] to schedule a Palliative Care consult appointment. No answer left a message to return call.

## 2020-08-01 NOTE — Telephone Encounter (Signed)
Spoke with patient's caregiver Archie and scheduled an in-person Palliative Consult for 09/01/20 @ 12:30PM  COVID screening was negative. No pets in home. Patient lives with Archie.  Consent obtained; updated Outlook/Netsmart/Team List and Epic.   Family is aware they may be receiving a call from NP the day before or day of to confirm appointment.

## 2020-08-24 ENCOUNTER — Encounter (INDEPENDENT_AMBULATORY_CARE_PROVIDER_SITE_OTHER): Payer: Medicare Other | Admitting: Ophthalmology

## 2020-09-01 ENCOUNTER — Other Ambulatory Visit: Payer: Self-pay | Admitting: Student

## 2020-09-06 ENCOUNTER — Telehealth: Payer: Self-pay

## 2020-09-06 NOTE — Telephone Encounter (Signed)
Spoke with patient's caregiver Archie and scheduled an in-person Palliative Consult for 10/09/20 @ 12:30PM  COVID screening was negative. No pets in home. Patient lives with caregiver and patient has a sitter 4 hours per day.   Consent obtained; updated Outlook/Netsmart/Team List and Epic.   Family is aware they may be receiving a call from NP the day before or day of to confirm appointment.

## 2020-09-22 ENCOUNTER — Telehealth: Payer: Self-pay | Admitting: Student

## 2020-09-22 NOTE — Telephone Encounter (Signed)
Spoke with patient's caregiver, Rosanna Randy, and have rescheduled the Palliative Care consult with Dr. Bufford Spikes, documentation will be noted in [facility]'s EMR Netsmart

## 2020-10-04 NOTE — Progress Notes (Signed)
[facility] Note  10/06/2020     CHIEF COMPLAINT Patient presents for Retina Follow Up   HISTORY OF PRESENT ILLNESS: Thomas Finley is a 65 y.o. male who presents to the clinic today for:   HPI     Retina Follow Up   Patient presents with  Diabetic Retinopathy.  In both eyes.  This started months ago.  Severity is moderate.  Duration of 10 weeks.  Since onset it is stable.  I, the attending physician,  performed the HPI with the patient and updated documentation appropriately.        Comments   65 y/o male pt here for 10 wk f/u for mod NPDR OU.  No change in [place] OU.  Denies pain, FOL, floaters.  No gtts.  BS 138 this a.m.  A1C unknown.  Pt does not talk very much, and answers most questions with a head nod.      Last edited by Bernarda Caffey, MD on 10/06/2020  6:17 PM.    Pt delayed to f/u.  9 wks instead of 5 wks.  Pt reports no change in [place] OU.  Referring physician: Monna Fam, MD [place],  [place] [address]  HISTORICAL INFORMATION:   Selected notes from the MEDICAL RECORD NUMBER Referred by Dr. Monna Fam for concern of CRVO   CURRENT MEDICATIONS: No current outpatient medications on file. (Ophthalmic Drugs)   No current facility-administered medications for this visit. (Ophthalmic Drugs)   Current Outpatient Medications (Other)  Medication Sig   atorvastatin (LIPITOR) 10 MG tablet Take 10 mg by mouth daily.   blood glucose meter kit and supplies KIT Dispense based on patient and insurance preference. Use up to four times daily as directed. (FOR ICD-10 E11.65).   Blood Glucose Monitoring Suppl (ACCU-CHEK GUIDE) w/Device KIT 1 Piece by Does not apply route as directed.   buPROPion (WELLBUTRIN XL) 300 MG 24 hr tablet Take 300 mg by mouth daily.   cephALEXin (KEFLEX) 500 MG capsule Take 500 mg by mouth 2 (two) times daily.   cyanocobalamin 1000 MCG tablet Take by mouth.   donepezil (ARICEPT) 5 MG tablet Take by  mouth.   doxycycline (ADOXA) 100 MG tablet Take 100 mg by mouth 2 (two) times daily. for 10 days   Dulaglutide (TRULICITY) 1.5 ZW/2.5EN SOPN Inject into the skin once a week.   escitalopram (LEXAPRO) 20 MG tablet Take by mouth.   FLUoxetine (PROZAC) 20 MG tablet daily.   FLUoxetine (PROZAC) 40 MG capsule 1 capsule DAILY (route: oral)   glipiZIDE (GLUCOTROL) 10 MG tablet Take 5 mg by mouth 2 (two) times daily with a meal.   glucose blood (ACCU-CHEK GUIDE) test strip Use as instructed   glucose blood test strip 1 each by Other route as needed. Use as instructed 4 x daily. E11.65. One touch ultra   hydrOXYzine (ATARAX/VISTARIL) 25 MG tablet Take 25 mg by mouth 2 (two) times daily.   insulin degludec (TRESIBA FLEXTOUCH) 100 UNIT/ML SOPN FlexTouch Pen Inject 0.2 mLs (20 Units total) into the skin daily.   insulin glargine, 2 Unit Dial, (TOUJEO MAX SOLOSTAR) 300 UNIT/ML Solostar Pen Inject into the skin.   Insulin Glargine-Lixisenatide 100-33 UNT-MCG/ML SOPN Inject into the skin.   insulin lispro (HUMALOG) 100 UNIT/ML KwikPen Inject into the skin.   Insulin Pen Needle (BD PEN NEEDLE NANO U/F) 32G X 4 MM MISC 1 each by Does not apply route at bedtime.   lansoprazole (PREVACID) 30  MG capsule Take by mouth.   linagliptin (TRADJENTA) 5 MG TABS tablet Take 1 tablet (5 mg total) by mouth daily.   lisinopril (PRINIVIL,ZESTRIL) 2.5 MG tablet Take 2.5 mg by mouth daily.   metoCLOPramide (REGLAN) 10 MG tablet Take by mouth.   midodrine (PROAMATINE) 5 MG tablet Take by mouth.   Misc. [facility] Bed Extension piece.   pantoprazole (PROTONIX) 40 MG tablet Take by mouth.   pioglitazone (ACTOS) 30 MG tablet Take by mouth.   No current facility-administered medications for this visit. (Other)      REVIEW OF SYSTEMS: ROS   Positive for: Endocrine, Eyes Negative for: Constitutional, Gastrointestinal, Neurological, Skin, Genitourinary, Musculoskeletal, HENT, Cardiovascular, Respiratory,  Psychiatric, Allergic/Imm, Heme/Lymph Last edited by Matthew Folks, COA on 10/06/2020  2:06 PM.        ALLERGIES No Known Allergies  PAST MEDICAL HISTORY Past Medical History:  Diagnosis Date   Diabetes ([place])    Diabetic retinopathy ([place])    NPDR OU   Hypertension    Hypertensive retinopathy    OU   Past Surgical History:  Procedure Laterality Date   CATARACT EXTRACTION Bilateral    Dr. Herbert Deaner   EYE SURGERY Bilateral    Cat Sx - Dr. Herbert Deaner    FAMILY HISTORY Family History  Problem Relation Age of Onset   Diabetes Father    Diabetes Sister    Diabetes Brother     SOCIAL HISTORY Social History   Tobacco Use   Smoking status: Never   Smokeless tobacco: Never  Vaping Use   Vaping Use: Never used  Substance Use Topics   Alcohol use: Never   Drug use: Never         OPHTHALMIC EXAM:  Base Eye Exam     Visual Acuity (Snellen - Linear)       Right Left   Dist [place] 20/25 -2 20/25 -   Dist ph [place] NI NI         Tonometry (Tonopen, 2:11 PM)       Right Left   Pressure 16 15         Pupils       Dark Light Shape React APD   Right 2 1 Round Minimal None   Left 2 1 Round Minimal None         Visual Fields (Counting fingers)       Left Right    Full Full         Extraocular Movement       Right Left    Full, Ortho Full, Ortho         Neuro/Psych     Oriented x3: Yes   Mood/Affect: Normal         Dilation     Both eyes: 1.0% Mydriacyl, 2.5% Phenylephrine @ 2:12 PM           Slit Lamp and Fundus Exam     Slit Lamp Exam       Right Left   Lids/Lashes Dermatochalasis - upper lid Dermatochalasis - upper lid   Conjunctiva/Sclera White and quiet White and quiet   Cornea 2+ Punctate epithelial erosions, well healed temporal cataract wounds trace Punctate epithelial erosions, mild arcus, well healed temporal cataract wounds   Anterior Chamber deep, clear, narrow temporal angle Deep and quiet   Iris Round and dilated, No  NVI Round and dilated, No NVI   Lens PC IOL in good position PC IOL in good position  Vitreous Vitreous syneresis Vitreous syneresis         Fundus Exam       Right Left   Disc mild Pallor, Sharp rim, no NVD Pink and Sharp   C/D Ratio 0.2 0.3   Macula good foveal reflex, scattered MA/DBH/CWS, +edema - slightly improved good foveal reflex, scattered IRH -- improved, +cystic changes, +edema - slightly improved, CWS   Vessels attenuated, Tortuous attenuated, Tortuous   Periphery Attached, scattered IRH/DBH/CWS greatest posteriorly Attached, scattered IRH/DBH greatest nasal to disc             IMAGING AND PROCEDURES  Imaging and Procedures for 10/06/2020  OCT, Retina - OU - Both Eyes       Right Eye Quality was good. Central Foveal Thickness: 315. Progression has improved. Findings include abnormal foveal contour, intraretinal fluid, intraretinal hyper-reflective material, no SRF (Mild interval improvement in IRF).   Left Eye Quality was good. Central Foveal Thickness: 314. Progression has improved. Findings include abnormal foveal contour, intraretinal fluid, no SRF, vitreomacular adhesion , intraretinal hyper-reflective material (Mild interval improvement in IRF).   Notes *Images captured and stored on drive  Diagnosis / Impression:  DME OU OD: Mild interval improvement in IRF OS: Mild interval improvement in IRF  Clinical management:  See below  Abbreviations: NFP - Normal foveal profile. CME - cystoid macular edema. PED - pigment epithelial detachment. IRF - intraretinal fluid. SRF - subretinal fluid. EZ - ellipsoid zone. ERM - epiretinal membrane. ORA - outer retinal atrophy. ORT - outer retinal tubulation. SRHM - subretinal hyper-reflective material. IRHM - intraretinal hyper-reflective material      Intravitreal Injection, Pharmacologic Agent - OD - Right Eye       Time Out 10/06/2020. 3:36 PM. Confirmed correct patient, procedure, site, and patient consented.    Anesthesia Topical anesthesia was used. Anesthetic medications included Lidocaine 2%, Proparacaine 0.5%.   Procedure Preparation included eyelid speculum, 5% betadine to ocular surface. A supplied (32g) needle was used.   Injection: 1.25 mg Bevacizumab 1.66m/0.05ml   Route: Intravitreal, Site: Right Eye   NDC: 50242-060-01, Lot:: [id number] Expiration date: 11/20/2020, Waste: 0.05 mL   Post-op Post injection exam found visual acuity of at least counting fingers. The patient tolerated the procedure well. There were no complications. The patient received written and verbal post procedure care education. Post injection medications were not given.      Intravitreal Injection, Pharmacologic Agent - OS - Left Eye       Time Out 10/06/2020. 3:37 PM. Confirmed correct patient, procedure, site, and patient consented.   Anesthesia Topical anesthesia was used. Anesthetic medications included Lidocaine 2%, Proparacaine 0.5%.   Procedure Preparation included 5% betadine to ocular surface, eyelid speculum. A (32g) needle was used.   Injection: 1.25 mg Bevacizumab 1.270m0.05ml   Route: Intravitreal, Site: Left Eye   NDC: [id number]: : [id number] date: 11/03/2020, Waste: 0.05 mL   Post-op Post injection exam found visual acuity of at least counting fingers. The patient tolerated the procedure well. There were no complications. The patient received written and verbal post procedure care education. Post injection medications were not given.               ASSESSMENT/PLAN:    ICD-10-CM   1. 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 Retinal edema  H35.81 OCT, Retina - OU - Both  Eyes    3. Branch retinal vein occlusion of right eye with macular edema  H34.8310     4. Essential hypertension  I10     5. Hypertensive retinopathy of both eyes  H35.033     6. Pseudophakia, both eyes  Z96.1       1,2. Moderate non-proliferative diabetic retinopathy, OU (OD>OS)  - delayed f/u -- 9 wks instead of  - s/p IVA OD #1 (12.31.21)  - s/p IVA OS #1 (01.04.22)  - s/p IVA OU #2 (02.04.22), #3 (03.04.22), #4 (04.01.22),#5 (05.05.22) - exam shows scattered MA/DBH OU, central edema OU - BCVA stable at 20/25 OU  - OCT shows mild interval improvement in IRF OU at 9 wks - recommend IVA OU #6 today, 07.15.22 w/ f/u in 8 wks - pt wishes to proceed with injection  - RBA of procedure discussed, questions answered  - Avastin informed consent obtained and signed (OU) - see procedure note - f/u in 8 weeks, DFE, OCT, possible injections  3. BRVO w/ CME OD  - s/p IVA OD as above - BCVA 20/25 -- stable - OCT shows +DME with BRVO component -- ME improving - recommend IVA OD #6 today as above - F/U 8 weeks -- DFE/OCT/possible injection  4,5. Hypertensive retinopathy OU - discussed importance of tight BP control - monitor  6. Pseudophakia OU  - s/p CE/IOL OU (Dr. Herbert Deaner)  - IOLs in good position, doing well - monitor   Ophthalmic Meds Ordered this visit:  Meds ordered this encounter  Medications   Bevacizumab (AVASTIN) SOLN 1.25 mg   Bevacizumab (AVASTIN) SOLN 1.25 mg        Return in about 8 weeks (around 12/01/2020) for 8 wk f/u for mod NPDR OU w/DFE/OCT/likely inj. OU.  There are no Patient Instructions on file for this visit.   Explained the diagnoses, plan, and follow up with the patient and they expressed understanding.  Patient expressed understanding of the importance of proper follow up care.   This document serves as a record of services personally performed by Gardiner Sleeper, MD, PhD. It was created on their behalf by Estill Bakes, COT an ophthalmic  technician. The creation of this record is the provider's dictation and/or activities during the visit.    Electronically signed by: Estill Bakes, COT 7.15.22 @ 6:32 PM    Gardiner Sleeper, M.D., Ph.D. Diseases & Surgery of the Retina and [place] 7.15.22  I have reviewed the above documentation for accuracy and completeness, and I agree with the above. Gardiner Sleeper, M.D., Ph.D. 10/06/20 6:32 PM   Abbreviations: M myopia (nearsighted); A astigmatism; H hyperopia (farsighted); P presbyopia; Mrx spectacle prescription;  CTL contact lenses; OD right eye; OS left eye; OU both eyes  XT exotropia; ET esotropia; PEK punctate epithelial keratitis; PEE punctate epithelial erosions; DES dry eye syndrome; MGD meibomian gland dysfunction; ATs artificial tears; PFAT's preservative free artificial tears; [place] nuclear sclerotic cataract; PSC posterior subcapsular cataract; ERM epi-retinal membrane; PVD posterior vitreous detachment; RD retinal detachment; DM diabetes mellitus; DR diabetic retinopathy; NPDR non-proliferative diabetic retinopathy; PDR proliferative diabetic retinopathy; CSME clinically significant macular edema; DME diabetic macular edema; dbh dot blot hemorrhages; CWS cotton wool spot; POAG primary open angle glaucoma; C/D cup-to-disc ratio; HVF humphrey visual field; GVF goldmann visual  field; OCT optical coherence tomography; IOP intraocular pressure; BRVO Branch retinal vein occlusion; CRVO central retinal vein occlusion; CRAO central retinal artery occlusion; BRAO branch retinal artery occlusion; RT retinal tear; SB scleral buckle; PPV pars plana vitrectomy; VH Vitreous hemorrhage; PRP panretinal laser photocoagulation; IVK intravitreal kenalog; VMT vitreomacular traction; MH Macular hole;  NVD neovascularization of the disc; NVE neovascularization elsewhere; AREDS age related eye disease study; ARMD age related macular degeneration; POAG primary open angle  glaucoma; EBMD epithelial/anterior basement membrane dystrophy; ACIOL anterior chamber intraocular lens; IOL intraocular lens; PCIOL posterior chamber intraocular lens; Phaco/IOL phacoemulsification with intraocular lens placement; [place] photorefractive keratectomy; LASIK laser assisted in situ keratomileusis; HTN hypertension; DM diabetes mellitus; COPD chronic obstructive pulmonary disease

## 2020-10-06 ENCOUNTER — Encounter (INDEPENDENT_AMBULATORY_CARE_PROVIDER_SITE_OTHER): Payer: Self-pay | Admitting: Ophthalmology

## 2020-10-06 ENCOUNTER — Ambulatory Visit (INDEPENDENT_AMBULATORY_CARE_PROVIDER_SITE_OTHER): Payer: Medicare Other | Admitting: Ophthalmology

## 2020-10-06 ENCOUNTER — Other Ambulatory Visit: Payer: Self-pay

## 2020-10-06 DIAGNOSIS — I10 Essential (primary) hypertension: Secondary | ICD-10-CM

## 2020-10-06 DIAGNOSIS — H3581 Retinal edema: Secondary | ICD-10-CM

## 2020-10-06 DIAGNOSIS — H34831 Tributary (branch) retinal vein occlusion, right eye, with macular edema: Secondary | ICD-10-CM | POA: Diagnosis not present

## 2020-10-06 DIAGNOSIS — E113313 Type 2 diabetes mellitus with moderate nonproliferative diabetic retinopathy with macular edema, bilateral: Secondary | ICD-10-CM

## 2020-10-06 DIAGNOSIS — Z961 Presence of intraocular lens: Secondary | ICD-10-CM

## 2020-10-06 DIAGNOSIS — H35033 Hypertensive retinopathy, bilateral: Secondary | ICD-10-CM

## 2020-10-06 MED ORDER — BEVACIZUMAB CHEMO INJECTION 1.25MG/0.05ML SYRINGE FOR KALEIDOSCOPE
1.2500 mg | INTRAVITREAL | Status: AC | PRN
  Administered 2020-10-06: 1.25 mg via INTRAVITREAL

## 2020-10-09 ENCOUNTER — Other Ambulatory Visit: Payer: Self-pay | Admitting: Student

## 2020-12-01 ENCOUNTER — Encounter (INDEPENDENT_AMBULATORY_CARE_PROVIDER_SITE_OTHER): Payer: Medicare Other | Admitting: Ophthalmology

## 2021-01-30 NOTE — Progress Notes (Addendum)
[facility] Note  01/31/2021     CHIEF COMPLAINT Patient presents for Retina Follow Up  HISTORY OF PRESENT ILLNESS: Thomas Finley is a 65 y.o. male who presents to the clinic today for:   HPI     Retina Follow Up   Patient presents with  Diabetic Retinopathy.  In both eyes.  This started 8 weeks ago.  I, the attending physician,  performed the HPI with the patient and updated documentation appropriately.        Comments   Patient here for 8 weeks retina follow up for NPDR OU. Patient states vision doing alright. No eye pain.       Last edited by Bernarda Caffey, MD on 01/31/2021  4:52 PM.    Pt lost to f/u.  Pt has "good days and bad days," and has issues w/transportation.  Referring physician: Monna Fam, MD [place],  [place] [address]  HISTORICAL INFORMATION:   Selected notes from the MEDICAL RECORD NUMBER Referred by Dr. Monna Fam for concern of CRVO   CURRENT MEDICATIONS: No current outpatient medications on file. (Ophthalmic Drugs)   No current facility-administered medications for this visit. (Ophthalmic Drugs)   Current Outpatient Medications (Other)  Medication Sig   atorvastatin (LIPITOR) 10 MG tablet Take 10 mg by mouth daily.   blood glucose meter kit and supplies KIT Dispense based on patient and insurance preference. Use up to four times daily as directed. (FOR ICD-10 E11.65).   Blood Glucose Monitoring Suppl (ACCU-CHEK GUIDE) w/Device KIT 1 Piece by Does not apply route as directed.   buPROPion (WELLBUTRIN XL) 300 MG 24 hr tablet Take 300 mg by mouth daily.   cephALEXin (KEFLEX) 500 MG capsule Take 500 mg by mouth 2 (two) times daily.   cyanocobalamin 1000 MCG tablet Take by mouth.   donepezil (ARICEPT) 5 MG tablet Take by mouth.   doxycycline (ADOXA) 100 MG tablet Take 100 mg by mouth 2 (two) times daily. for 10 days   Dulaglutide (TRULICITY) 1.5 WI/0.9BD SOPN Inject into the skin once a week.    escitalopram (LEXAPRO) 20 MG tablet Take by mouth.   FLUoxetine (PROZAC) 20 MG tablet daily.   FLUoxetine (PROZAC) 40 MG capsule 1 capsule DAILY (route: oral)   glipiZIDE (GLUCOTROL) 10 MG tablet Take 5 mg by mouth 2 (two) times daily with a meal.   glucose blood (ACCU-CHEK GUIDE) test strip Use as instructed   glucose blood test strip 1 each by Other route as needed. Use as instructed 4 x daily. E11.65. One touch ultra   hydrOXYzine (ATARAX/VISTARIL) 25 MG tablet Take 25 mg by mouth 2 (two) times daily.   insulin degludec (TRESIBA FLEXTOUCH) 100 UNIT/ML SOPN FlexTouch Pen Inject 0.2 mLs (20 Units total) into the skin daily.   insulin glargine, 2 Unit Dial, (TOUJEO MAX SOLOSTAR) 300 UNIT/ML Solostar Pen Inject into the skin.   Insulin Glargine-Lixisenatide 100-33 UNT-MCG/ML SOPN Inject into the skin.   insulin lispro (HUMALOG) 100 UNIT/ML KwikPen Inject into the skin.   Insulin Pen Needle (BD PEN NEEDLE NANO U/F) 32G X 4 MM MISC 1 each by Does not apply route at bedtime.   lansoprazole (PREVACID) 30 MG capsule Take by mouth.   linagliptin (TRADJENTA) 5 MG TABS tablet Take 1 tablet (5 mg total) by mouth daily.   lisinopril (PRINIVIL,ZESTRIL) 2.5 MG tablet Take 2.5 mg by mouth daily.   metoCLOPramide (REGLAN) 10 MG tablet Take by mouth.   Misc.  [facility] Bed Extension piece.   pantoprazole (PROTONIX) 40 MG tablet Take by mouth.   pioglitazone (ACTOS) 30 MG tablet Take by mouth.   No current facility-administered medications for this visit. (Other)   REVIEW OF SYSTEMS: ROS   Positive for: Neurological, Genitourinary, Endocrine, Cardiovascular, Eyes Negative for: Constitutional, Gastrointestinal, Skin, Musculoskeletal, HENT, Respiratory, Psychiatric, Allergic/Imm, Heme/Lymph Last edited by Theodore Demark, COA on 01/31/2021  2:55 PM.     ALLERGIES No Known Allergies  PAST MEDICAL HISTORY Past Medical History:  Diagnosis Date   Diabetes ([place])    Diabetic retinopathy ([place])     NPDR OU   Hypertension    Hypertensive retinopathy    OU   Past Surgical History:  Procedure Laterality Date   CATARACT EXTRACTION Bilateral    Dr. Herbert Deaner   EYE SURGERY Bilateral    Cat Sx - Dr. Herbert Deaner    FAMILY HISTORY Family History  Problem Relation Age of Onset   Diabetes Father    Diabetes Sister    Diabetes Brother     SOCIAL HISTORY Social History   Tobacco Use   Smoking status: Never   Smokeless tobacco: Never  Vaping Use   Vaping Use: Never used  Substance Use Topics   Alcohol use: Never   Drug use: Never       OPHTHALMIC EXAM: Base Eye Exam     Visual Acuity (Snellen - Linear)       Right Left   Dist  20/25 20/20 -1         Tonometry (Tonopen, 2:53 PM)       Right Left   Pressure 14 22         Pupils       Dark Light Shape React APD   Right 2 1 Round Minimal None   Left 2 1 Round Minimal None         Visual Fields (Counting fingers)       Left Right    Full Full         Extraocular Movement       Right Left    Full, Ortho Full, Ortho         Neuro/Psych     Oriented x3: Yes   Mood/Affect: Normal         Dilation     Both eyes: 1.0% Mydriacyl, 2.5% Phenylephrine @ 2:53 PM           Slit Lamp and Fundus Exam     Slit Lamp Exam       Right Left   Lids/Lashes Dermatochalasis - upper lid Dermatochalasis - upper lid   Conjunctiva/Sclera White and quiet White and quiet   Cornea 2+ Punctate epithelial erosions, well healed temporal cataract wounds trace Punctate epithelial erosions, mild arcus, well healed temporal cataract wounds   Anterior Chamber deep, clear, narrow temporal angle Deep and quiet   Iris Round and dilated, No NVI Round and dilated, No NVI   Lens PC IOL in good position PC IOL in good position   Vitreous Vitreous syneresis Vitreous syneresis         Fundus Exam       Right Left   Disc mild Pallor, Sharp rim, no NVD Pink and Sharp   C/D Ratio 0.2 0.3   Macula good foveal reflex,  scattered MA/DBH/CWS, +edema - slightly improved, punctate exudates good foveal reflex, scattered IRH -- improved, +cystic changes, +edema - slightly improved, CWS, +punctate exudates   Vessels  attenuated, Tortuous attenuated, Tortuous   Periphery Attached, scattered IRH/DBH/CWS greatest posteriorly Attached, scattered IRH/DBH/CWS greatest nasal to disc             IMAGING AND PROCEDURES  Imaging and Procedures for 01/31/2021  OCT, Retina - OU - Both Eyes       Right Eye Quality was good. Central Foveal Thickness: 303. Progression has improved. Findings include abnormal foveal contour, intraretinal fluid, intraretinal hyper-reflective material, no SRF, vitreomacular adhesion (Interval improvement in IRF and IRHM).   Left Eye Quality was good. Central Foveal Thickness: 294. Progression has improved. Findings include abnormal foveal contour, intraretinal fluid, no SRF, vitreomacular adhesion , intraretinal hyper-reflective material (Mild interval improvement in IRF and foveal contour).   Notes *Images captured and stored on drive  Diagnosis / Impression:  DME OU OD: Interval improvement in IRF and IRHM OS: Mild interval improvement in IRF and foveal contour  Clinical management:  See below  Abbreviations: NFP - Normal foveal profile. CME - cystoid macular edema. PED - pigment epithelial detachment. IRF - intraretinal fluid. SRF - subretinal fluid. EZ - ellipsoid zone. ERM - epiretinal membrane. ORA - outer retinal atrophy. ORT - outer retinal tubulation. SRHM - subretinal hyper-reflective material. IRHM - intraretinal hyper-reflective material      Intravitreal Injection, Pharmacologic Agent - OD - Right Eye       Time Out 01/31/2021. 3:59 PM. Confirmed correct patient, procedure, site, and patient consented.   Anesthesia Topical anesthesia was used. Anesthetic medications included Lidocaine 2%, Proparacaine 0.5%.   Procedure Preparation included eyelid speculum, 5%  betadine to ocular surface. A supplied (32g) needle was used.   Injection: 1.25 mg Bevacizumab 1.79m/0.05ml   Route: Intravitreal, Site: Right Eye   NDC:: 64332-951-88 Lot: [id number] , Expiration date: 04/03/2021, Waste: 0 mL   Post-op Post injection exam found visual acuity of at least counting fingers. The patient tolerated the procedure well. There were no complications. The patient received written and verbal post procedure care education. Post injection medications were not given.      Intravitreal Injection, Pharmacologic Agent - OS - Left Eye       Time Out 01/31/2021. 3:59 PM. Confirmed correct patient, procedure, site, and patient consented.   Anesthesia Topical anesthesia was used. Anesthetic medications included Lidocaine 2%, Proparacaine 0.5%.   Procedure Preparation included 5% betadine to ocular surface, eyelid speculum. A (32g) needle was used.   Injection: 1.25 mg Bevacizumab 1.248m0.05ml   Route: Intravitreal, Site: Left Eye   NDC: 5030mLot: [id number] date: 03/13/2021, Waste: 0.05 mL   Post-op Post injection exam found visual acuity of at least counting fingers. The patient tolerated the procedure well. There were no complications. The patient received written and verbal post procedure care education. Post injection medications were not given.            ASSESSMENT/PLAN:    ICD-10-CM   1. 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 Retinal edema  H35.81 OCT, Retina - OU - Both Eyes    3. Branch retinal vein occlusion of right eye with macular edema  H34.8310     4. Essential hypertension  I10     5. Hypertensive retinopathy of both  eyes   H35.033     6. Pseudophakia, both eyes  Z96.1      1,2. Moderate non-proliferative diabetic retinopathy, OU (OD>OS)  - delayed f/u -- 4 mos instead of 8 wks  - s/p IVA OD #1 (12.31.21)  - s/p IVA OS #1 (01.04.22)  - s/p IVA OU #2 (02.04.22), #3 (03.04.22), #4 (04.01.22),#5 (05.05.22), #6 (07.15.22) - exam shows scattered MA/DBH OU, central edema OU - BCVA 20/25 OD, 20/20 OS  - OCT shows mild interval improvement in IRF OU at 4 mos - recommend IVA OU #7 today, 11.09.22 w/ f/u in 6 wks - pt wishes to proceed with injection  - RBA of procedure discussed, questions answered  - Avastin informed consent obtained and signed (OU) - see procedure note - f/u in 6 weeks, DFE, OCT, possible injections  3. BRVO w/ CME OD  - s/p IVA OD as above - BCVA 20/25 -- stable - OCT shows interval improvement in IRF and IRHM - recommend IVA OD #7 today as above - F/U 6 weeks -- DFE/OCT/possible injection  4,5. Hypertensive retinopathy OU - discussed importance of tight BP control - monitor  6. Pseudophakia OU  - s/p CE/IOL OU (Dr. Herbert Deaner)  - IOLs in good position, doing well - monitor  Ophthalmic Meds Ordered this visit:  Meds ordered this encounter  Medications   Bevacizumab (AVASTIN) SOLN 1.25 mg   Bevacizumab (AVASTIN) SOLN 1.25 mg     Return in about 6 weeks (around 03/14/2021) for mod NPDR OU w/DFE/OCT/likely injs..  There are no Patient Instructions on file for this visit.  Explained the diagnoses, plan, and follow up with the patient and they expressed understanding.  Patient expressed understanding of the importance of proper follow up care.   This document serves as a record of services personally performed by Gardiner Sleeper, MD, PhD. It was created on their behalf by Estill Bakes, COT an ophthalmic technician. The creation of this record is the provider's dictation and/or activities during the visit.    Electronically signed by: Estill Bakes, COT 11.9.22 @ 4:58 PM   Gardiner Sleeper, M.D., Ph.D. Diseases & Surgery of the Retina and [place] 11.9.22  I have reviewed the above documentation for accuracy and completeness, and I agree with the above. Gardiner Sleeper, M.D., Ph.D. 01/31/21 4:58 PM  Abbreviations: M myopia (nearsighted); A astigmatism; H hyperopia (farsighted); P presbyopia; Mrx spectacle prescription;  CTL contact lenses; OD right eye; OS left eye; OU both eyes  XT exotropia; ET esotropia; PEK punctate epithelial keratitis; PEE punctate epithelial erosions; DES dry eye syndrome; MGD meibomian gland dysfunction; ATs artificial tears; PFAT's preservative free artificial tears; [place] nuclear sclerotic cataract; PSC posterior subcapsular cataract; ERM epi-retinal membrane; PVD posterior vitreous detachment; RD retinal detachment; DM diabetes mellitus; DR diabetic retinopathy; NPDR non-proliferative diabetic retinopathy; PDR proliferative diabetic retinopathy; CSME clinically significant macular edema; DME diabetic macular edema; dbh dot blot hemorrhages; CWS cotton wool spot; POAG primary open angle glaucoma; C/D cup-to-disc ratio; HVF humphrey visual field; GVF goldmann visual field; OCT optical coherence tomography; IOP intraocular pressure; BRVO Branch retinal vein occlusion; CRVO central retinal vein occlusion; CRAO central retinal artery occlusion; BRAO branch retinal artery occlusion; RT retinal tear; SB scleral buckle; PPV pars plana vitrectomy; VH Vitreous hemorrhage; PRP panretinal laser photocoagulation; IVK intravitreal kenalog; VMT vitreomacular traction; MH Macular hole;  NVD neovascularization of the disc; NVE neovascularization elsewhere; AREDS age related eye disease study; ARMD age related  macular degeneration; POAG primary open angle glaucoma; EBMD epithelial/anterior basement membrane dystrophy; ACIOL anterior chamber intraocular lens; IOL intraocular lens; PCIOL posterior chamber intraocular lens; Phaco/IOL  phacoemulsification with intraocular lens placement; [place] photorefractive keratectomy; LASIK laser assisted in situ keratomileusis; HTN hypertension; DM diabetes mellitus; COPD chronic obstructive pulmonary disease

## 2021-01-31 ENCOUNTER — Ambulatory Visit (INDEPENDENT_AMBULATORY_CARE_PROVIDER_SITE_OTHER): Payer: Medicare Other | Admitting: Ophthalmology

## 2021-01-31 ENCOUNTER — Encounter (INDEPENDENT_AMBULATORY_CARE_PROVIDER_SITE_OTHER): Payer: Self-pay | Admitting: Ophthalmology

## 2021-01-31 ENCOUNTER — Other Ambulatory Visit: Payer: Self-pay

## 2021-01-31 DIAGNOSIS — E113313 Type 2 diabetes mellitus with moderate nonproliferative diabetic retinopathy with macular edema, bilateral: Secondary | ICD-10-CM

## 2021-01-31 DIAGNOSIS — I10 Essential (primary) hypertension: Secondary | ICD-10-CM

## 2021-01-31 DIAGNOSIS — H35033 Hypertensive retinopathy, bilateral: Secondary | ICD-10-CM

## 2021-01-31 DIAGNOSIS — H34831 Tributary (branch) retinal vein occlusion, right eye, with macular edema: Secondary | ICD-10-CM | POA: Diagnosis not present

## 2021-01-31 DIAGNOSIS — Z961 Presence of intraocular lens: Secondary | ICD-10-CM

## 2021-01-31 DIAGNOSIS — H3581 Retinal edema: Secondary | ICD-10-CM

## 2021-01-31 MED ORDER — BEVACIZUMAB CHEMO INJECTION 1.25MG/0.05ML SYRINGE FOR KALEIDOSCOPE
1.2500 mg | INTRAVITREAL | Status: AC | PRN
Start: 2021-01-31 — End: 2021-01-31
  Administered 2021-01-31: 1.25 mg via INTRAVITREAL

## 2021-01-31 MED ORDER — BEVACIZUMAB CHEMO INJECTION 1.25MG/0.05ML SYRINGE FOR KALEIDOSCOPE
1.2500 mg | INTRAVITREAL | Status: AC | PRN
  Administered 2021-01-31: 1.25 mg via INTRAVITREAL

## 2021-03-12 NOTE — Progress Notes (Shared)
[facility] Note  03/14/2021     CHIEF COMPLAINT Patient presents for No chief complaint on file.   HISTORY OF PRESENT ILLNESS: Thomas Finley is a 65 y.o. male who presents to the clinic today for:     Referring physician: Kennieth Rad, MD [address] [place] [address]  HISTORICAL INFORMATION:   Selected notes from the MEDICAL RECORD NUMBER Referred by Dr. Monna Fam for concern of CRVO   CURRENT MEDICATIONS: No current outpatient medications on file. (Ophthalmic Drugs)   No current facility-administered medications for this visit. (Ophthalmic Drugs)   Current Outpatient Medications (Other)  Medication Sig   atorvastatin (LIPITOR) 10 MG tablet Take 10 mg by mouth daily.   blood glucose meter kit and supplies KIT Dispense based on patient and insurance preference. Use up to four times daily as directed. (FOR ICD-10 E11.65).   Blood Glucose Monitoring Suppl (ACCU-CHEK GUIDE) w/Device KIT 1 Piece by Does not apply route as directed.   buPROPion (WELLBUTRIN XL) 300 MG 24 hr tablet Take 300 mg by mouth daily.   cephALEXin (KEFLEX) 500 MG capsule Take 500 mg by mouth 2 (two) times daily.   cyanocobalamin 1000 MCG tablet Take by mouth.   donepezil (ARICEPT) 5 MG tablet Take by mouth.   doxycycline (ADOXA) 100 MG tablet Take 100 mg by mouth 2 (two) times daily. for 10 days   Dulaglutide (TRULICITY) 1.5 PP/2.9JJ SOPN Inject into the skin once a week.   escitalopram (LEXAPRO) 20 MG tablet Take by mouth.   FLUoxetine (PROZAC) 20 MG tablet daily.   FLUoxetine (PROZAC) 40 MG capsule 1 capsule DAILY (route: oral)   glipiZIDE (GLUCOTROL) 10 MG tablet Take 5 mg by mouth 2 (two) times daily with a meal.   glucose blood (ACCU-CHEK GUIDE) test strip Use as instructed   glucose blood test strip 1 each by Other route as needed. Use as instructed 4 x daily. E11.65. One touch ultra   hydrOXYzine (ATARAX/VISTARIL) 25 MG tablet Take 25 mg by  mouth 2 (two) times daily.   insulin degludec (TRESIBA FLEXTOUCH) 100 UNIT/ML SOPN FlexTouch Pen Inject 0.2 mLs (20 Units total) into the skin daily.   insulin glargine, 2 Unit Dial, (TOUJEO MAX SOLOSTAR) 300 UNIT/ML Solostar Pen Inject into the skin.   Insulin Glargine-Lixisenatide 100-33 UNT-MCG/ML SOPN Inject into the skin.   insulin lispro (HUMALOG) 100 UNIT/ML KwikPen Inject into the skin.   Insulin Pen Needle (BD PEN NEEDLE NANO U/F) 32G X 4 MM MISC 1 each by Does not apply route at bedtime.   lansoprazole (PREVACID) 30 MG capsule Take by mouth.   linagliptin (TRADJENTA) 5 MG TABS tablet Take 1 tablet (5 mg total) by mouth daily.   lisinopril (PRINIVIL,ZESTRIL) 2.5 MG tablet Take 2.5 mg by mouth daily.   metoCLOPramide (REGLAN) 10 MG tablet Take by mouth.   Misc. [facility] Bed Extension piece.   pantoprazole (PROTONIX) 40 MG tablet Take by mouth.   pioglitazone (ACTOS) 30 MG tablet Take by mouth.   No current facility-administered medications for this visit. (Other)   REVIEW OF SYSTEMS:   ALLERGIES No Known Allergies  PAST MEDICAL HISTORY Past Medical History:  Diagnosis Date   Diabetes ([place])    Diabetic retinopathy ([place])    NPDR OU   Hypertension    Hypertensive retinopathy    OU   Past Surgical History:  Procedure Laterality Date   CATARACT EXTRACTION Bilateral    Dr.  Hecker   EYE SURGERY Bilateral    Cat Sx - Dr. Herbert Deaner    FAMILY HISTORY Family History  Problem Relation Age of Onset   Diabetes Father    Diabetes Sister    Diabetes Brother     SOCIAL HISTORY Social History   Tobacco Use   Smoking status: Never   Smokeless tobacco: Never  Vaping Use   Vaping Use: Never used  Substance Use Topics   Alcohol use: Never   Drug use: Never       OPHTHALMIC EXAM: Not recorded     IMAGING AND PROCEDURES  Imaging and Procedures for 03/14/2021          ASSESSMENT/PLAN:  No diagnosis found.  1,2. Moderate non-proliferative  diabetic retinopathy, OU (OD>OS)  - delayed f/u -- 4 mos instead of 8 wks  - s/p IVA OD #1 (12.31.21)  - s/p IVA OS #1 (01.04.22)  - s/p IVA OU #2 (02.04.22), #3 (03.04.22), #4 (04.01.22),#5 (05.05.22), #6 (07.15.22), #7 (11.09.22) - exam shows scattered MA/DBH OU, central edema OU - BCVA 20/25 OD, 20/20 OS  - OCT shows mild interval improvement in IRF OU at 4 mos - recommend IVA OU #8 today, 12.21.22 w/ f/u in 6 wks - pt wishes to proceed with injection  - RBA of procedure discussed, questions answered  - Avastin informed consent obtained and signed (OU) - see procedure note - f/u in 6 weeks, DFE, OCT, possible injections  3. BRVO w/ CME OD  - s/p IVA OD as above - BCVA 20/25 -- stable - OCT shows interval improvement in IRF and IRHM - recommend IVA OD #7 today as above - F/U 6 weeks -- DFE/OCT/possible injection  4,5. Hypertensive retinopathy OU - discussed importance of tight BP control - monitor  6. Pseudophakia OU  - s/p CE/IOL OU (Dr. Herbert Deaner)  - IOLs in good position, doing well - monitor  Ophthalmic Meds Ordered this visit:  No orders of the defined types were placed in this encounter.    No follow-ups on file.  There are no Patient Instructions on file for this visit.  Explained the diagnoses, plan, and follow up with the patient and they expressed understanding.  Patient expressed understanding of the importance of proper follow up care.   This document serves as a record of services personally performed by Gardiner Sleeper, MD, PhD. It was created on their behalf by Orvan Falconer, an ophthalmic technician. The creation of this record is the provider's dictation and/or activities during the visit.    Electronically signed by: Orvan Falconer, OA, 03/12/21  9:56 AM   Gardiner Sleeper, M.D., Ph.D. Diseases & Surgery of the Retina and [place] 11.9.22  I have reviewed the above documentation for accuracy and completeness,  and I agree with the above. Gardiner Sleeper, M.D., Ph.D. 01/31/21 9:56 AM  Abbreviations: M myopia (nearsighted); A astigmatism; H hyperopia (farsighted); P presbyopia; Mrx spectacle prescription;  CTL contact lenses; OD right eye; OS left eye; OU both eyes  XT exotropia; ET esotropia; PEK punctate epithelial keratitis; PEE punctate epithelial erosions; DES dry eye syndrome; MGD meibomian gland dysfunction; ATs artificial tears; PFAT's preservative free artificial tears; [place] nuclear sclerotic cataract; PSC posterior subcapsular cataract; ERM epi-retinal membrane; PVD posterior vitreous detachment; RD retinal detachment; DM diabetes mellitus; DR diabetic retinopathy; NPDR non-proliferative diabetic retinopathy; PDR proliferative diabetic retinopathy; CSME clinically significant macular edema; DME diabetic macular edema; dbh dot blot hemorrhages; CWS cotton wool  spot; POAG primary open angle glaucoma; C/D cup-to-disc ratio; HVF humphrey visual field; GVF goldmann visual field; OCT optical coherence tomography; IOP intraocular pressure; BRVO Branch retinal vein occlusion; CRVO central retinal vein occlusion; CRAO central retinal artery occlusion; BRAO branch retinal artery occlusion; RT retinal tear; SB scleral buckle; PPV pars plana vitrectomy; VH Vitreous hemorrhage; PRP panretinal laser photocoagulation; IVK intravitreal kenalog; VMT vitreomacular traction; MH Macular hole;  NVD neovascularization of the disc; NVE neovascularization elsewhere; AREDS age related eye disease study; ARMD age related macular degeneration; POAG primary open angle glaucoma; EBMD epithelial/anterior basement membrane dystrophy; ACIOL anterior chamber intraocular lens; IOL intraocular lens; PCIOL posterior chamber intraocular lens; Phaco/IOL phacoemulsification with intraocular lens placement; [place] photorefractive keratectomy; LASIK laser assisted in situ keratomileusis; HTN hypertension; DM diabetes mellitus; COPD chronic obstructive  pulmonary disease

## 2021-03-14 ENCOUNTER — Other Ambulatory Visit: Payer: Self-pay

## 2021-03-14 ENCOUNTER — Encounter (INDEPENDENT_AMBULATORY_CARE_PROVIDER_SITE_OTHER): Payer: Self-pay | Admitting: Ophthalmology

## 2021-03-14 ENCOUNTER — Ambulatory Visit (INDEPENDENT_AMBULATORY_CARE_PROVIDER_SITE_OTHER): Payer: Medicare Other | Admitting: Ophthalmology

## 2021-03-14 ENCOUNTER — Encounter (INDEPENDENT_AMBULATORY_CARE_PROVIDER_SITE_OTHER): Payer: Medicare Other | Admitting: Ophthalmology

## 2021-03-14 DIAGNOSIS — H34831 Tributary (branch) retinal vein occlusion, right eye, with macular edema: Secondary | ICD-10-CM

## 2021-03-14 DIAGNOSIS — H35033 Hypertensive retinopathy, bilateral: Secondary | ICD-10-CM | POA: Diagnosis not present

## 2021-03-14 DIAGNOSIS — E113313 Type 2 diabetes mellitus with moderate nonproliferative diabetic retinopathy with macular edema, bilateral: Secondary | ICD-10-CM | POA: Diagnosis not present

## 2021-03-14 DIAGNOSIS — Z961 Presence of intraocular lens: Secondary | ICD-10-CM

## 2021-03-14 DIAGNOSIS — I10 Essential (primary) hypertension: Secondary | ICD-10-CM | POA: Diagnosis not present

## 2021-03-14 MED ORDER — BEVACIZUMAB CHEMO INJECTION 1.25MG/0.05ML SYRINGE FOR KALEIDOSCOPE
1.2500 mg | INTRAVITREAL | Status: AC | PRN
Start: 2021-03-14 — End: 2021-03-14
  Administered 2021-03-14: 17:00:00 1.25 mg via INTRAVITREAL

## 2021-03-14 NOTE — Progress Notes (Signed)
[facility] Note  03/14/2021     CHIEF COMPLAINT Patient presents for Retina Follow Up   HISTORY OF PRESENT ILLNESS: Thomas Finley is a 65 y.o. male who presents to the clinic today for:   HPI     Retina Follow Up   Patient presents with  Diabetic Retinopathy.  In both eyes.  This started 6 weeks ago.  I, the attending physician,  performed the HPI with the patient and updated documentation appropriately.        Comments   Patient here for 6 weeks retina follow up for NPDR OU. Patient states vision doing alright. No eye pain.       Last edited by Bernarda Caffey, MD on 03/14/2021  4:39 PM.    Pt states vision is stable, pts friend states that pts diabetic dr is very pleased with where his blood sugar is right now  Referring physician: Kennieth Rad, MD [address] [place] [address]  HISTORICAL INFORMATION:   Selected notes from the MEDICAL RECORD NUMBER Referred by Dr. Monna Fam for concern of CRVO   CURRENT MEDICATIONS: No current outpatient medications on file. (Ophthalmic Drugs)   No current facility-administered medications for this visit. (Ophthalmic Drugs)   Current Outpatient Medications (Other)  Medication Sig   atorvastatin (LIPITOR) 10 MG tablet Take 10 mg by mouth daily.   blood glucose meter kit and supplies KIT Dispense based on patient and insurance preference. Use up to four times daily as directed. (FOR ICD-10 E11.65).   Blood Glucose Monitoring Suppl (ACCU-CHEK GUIDE) w/Device KIT 1 Piece by Does not apply route as directed.   buPROPion (WELLBUTRIN XL) 300 MG 24 hr tablet Take 300 mg by mouth daily.   cephALEXin (KEFLEX) 500 MG capsule Take 500 mg by mouth 2 (two) times daily.   cyanocobalamin 1000 MCG tablet Take by mouth.   donepezil (ARICEPT) 5 MG tablet Take by mouth.   doxycycline (ADOXA) 100 MG tablet Take 100 mg by mouth 2 (two) times daily. for 10 days   Dulaglutide (TRULICITY) 1.5  RC/7.8LF SOPN Inject into the skin once a week.   escitalopram (LEXAPRO) 20 MG tablet Take by mouth.   FLUoxetine (PROZAC) 20 MG tablet daily.   FLUoxetine (PROZAC) 40 MG capsule 1 capsule DAILY (route: oral)   glipiZIDE (GLUCOTROL) 10 MG tablet Take 5 mg by mouth 2 (two) times daily with a meal.   glucose blood (ACCU-CHEK GUIDE) test strip Use as instructed   glucose blood test strip 1 each by Other route as needed. Use as instructed 4 x daily. E11.65. One touch ultra   hydrOXYzine (ATARAX/VISTARIL) 25 MG tablet Take 25 mg by mouth 2 (two) times daily.   insulin degludec (TRESIBA FLEXTOUCH) 100 UNIT/ML SOPN FlexTouch Pen Inject 0.2 mLs (20 Units total) into the skin daily.   insulin glargine, 2 Unit Dial, (TOUJEO MAX SOLOSTAR) 300 UNIT/ML Solostar Pen Inject into the skin.   Insulin Glargine-Lixisenatide 100-33 UNT-MCG/ML SOPN Inject into the skin.   insulin lispro (HUMALOG) 100 UNIT/ML KwikPen Inject into the skin.   Insulin Pen Needle (BD PEN NEEDLE NANO U/F) 32G X 4 MM MISC 1 each by Does not apply route at bedtime.   lansoprazole (PREVACID) 30 MG capsule Take by mouth.   linagliptin (TRADJENTA) 5 MG TABS tablet Take 1 tablet (5 mg total) by mouth daily.   lisinopril (PRINIVIL,ZESTRIL) 2.5 MG tablet Take 2.5 mg by mouth daily.   metoCLOPramide (REGLAN)  10 MG tablet Take by mouth.   Misc. [facility] Bed Extension piece.   pantoprazole (PROTONIX) 40 MG tablet Take by mouth.   pioglitazone (ACTOS) 30 MG tablet Take by mouth.   No current facility-administered medications for this visit. (Other)   REVIEW OF SYSTEMS: ROS   Positive for: Neurological, Genitourinary, Endocrine, Cardiovascular, Eyes Negative for: Constitutional, Gastrointestinal, Skin, Musculoskeletal, HENT, Respiratory, Psychiatric, Allergic/Imm, Heme/Lymph Last edited by Theodore Demark, COA on 03/14/2021  1:38 PM.     ALLERGIES No Known Allergies  PAST MEDICAL HISTORY Past Medical History:  Diagnosis  Date   Diabetes ([place])    Diabetic retinopathy ([place])    NPDR OU   Hypertension    Hypertensive retinopathy    OU   Past Surgical History:  Procedure Laterality Date   CATARACT EXTRACTION Bilateral    Dr. Herbert Deaner   EYE SURGERY Bilateral    Cat Sx - Dr. Herbert Deaner   FAMILY HISTORY Family History  Problem Relation Age of Onset   Diabetes Father    Diabetes Sister    Diabetes Brother    SOCIAL HISTORY Social History   Tobacco Use   Smoking status: Never   Smokeless tobacco: Never  Vaping Use   Vaping Use: Never used  Substance Use Topics   Alcohol use: Never   Drug use: Never       OPHTHALMIC EXAM: Base Eye Exam     Visual Acuity (Snellen - Linear)       Right Left   Dist [place] 20/30 -2 20/25 -2   Dist ph [place] 20/25 -2 20/20 -1         Tonometry (Tonopen, 1:34 PM)       Right Left   Pressure 16 15         Pupils       Dark Light Shape React APD   Right 2 1 Round Minimal None   Left 2 1 Round Minimal None         Visual Fields (Counting fingers)       Left Right    Full Full         Extraocular Movement       Right Left    Full, Ortho Full, Ortho         Neuro/Psych     Oriented x3: Yes   Mood/Affect: Normal         Dilation     Both eyes: 1.0% Mydriacyl, 2.5% Phenylephrine @ 1:34 PM           Slit Lamp and Fundus Exam     Slit Lamp Exam       Right Left   Lids/Lashes Dermatochalasis - upper lid Dermatochalasis - upper lid   Conjunctiva/Sclera White and quiet White and quiet   Cornea 2+ Punctate epithelial erosions, well healed temporal cataract wounds trace Punctate epithelial erosions, mild arcus, well healed temporal cataract wounds   Anterior Chamber deep, clear, narrow temporal angle Deep and quiet   Iris Round and dilated, No NVI Round and dilated, No NVI   Lens PC IOL in good position PC IOL in good position   Anterior Vitreous Vitreous syneresis Vitreous syneresis         Fundus Exam       Right Left   Disc  mild Pallor, Sharp rim, no NVD Pink and Sharp   C/D Ratio 0.2 0.3   Macula good foveal reflex, scattered MA/DBH/CWS, +edema - slightly improved, punctate exudates good  foveal reflex, scattered IRH -- improved, +cystic changes, +edema - slightly improved, CWS, +punctate exudates   Vessels attenuated, Tortuous attenuated, Tortuous   Periphery Attached, scattered IRH/DBH/CWS greatest posteriorly Attached, scattered IRH/DBH/CWS greatest nasal to disc             IMAGING AND PROCEDURES  Imaging and Procedures for 03/14/2021  OCT, Retina - OU - Both Eyes       Right Eye Quality was good. Central Foveal Thickness: 299. Progression has improved. Findings include abnormal foveal contour, intraretinal fluid, intraretinal hyper-reflective material, no SRF, vitreomacular adhesion (Interval improvement in IRF and IRHM).   Left Eye Quality was good. Central Foveal Thickness: 277. Progression has improved. Findings include intraretinal fluid, no SRF, vitreomacular adhesion , intraretinal hyper-reflective material, normal foveal contour (Mild interval improvement in IRF and IRHM).   Notes *Images captured and stored on drive  Diagnosis / Impression:  DME OU OD: Interval improvement in IRF and IRHM OS: Mild interval improvement in IRF and IRHM  Clinical management:  See below  Abbreviations: NFP - Normal foveal profile. CME - cystoid macular edema. PED - pigment epithelial detachment. IRF - intraretinal fluid. SRF - subretinal fluid. EZ - ellipsoid zone. ERM - epiretinal membrane. ORA - outer retinal atrophy. ORT - outer retinal tubulation. SRHM - subretinal hyper-reflective material. IRHM - intraretinal hyper-reflective material      Intravitreal Injection, Pharmacologic Agent - OD - Right Eye       Time Out 03/14/2021. 1:44 PM. Confirmed correct patient, procedure, site, and patient consented.   Anesthesia Topical anesthesia was used. Anesthetic medications included Lidocaine 2%,  Proparacaine 0.5%.   Procedure Preparation included eyelid speculum, 5% betadine to ocular surface. A supplied (32g) needle was used.   Injection: 1.25 mg Bevacizumab 1.39m/0.05ml   Route: Intravitreal, Site: Right Eye   NDC:: 24580-998-33 Lot: [id number] , Expiration date: 05/01/2021, Waste: 0 mL   Post-op Post injection exam found visual acuity of at least counting fingers. The patient tolerated the procedure well. There were no complications. The patient received written and verbal post procedure care education. Post injection medications were not given.      Intravitreal Injection, Pharmacologic Agent - OS - Left Eye       Time Out 03/14/2021. 1:45 PM. Confirmed correct patient, procedure, site, and patient consented.   Anesthesia Topical anesthesia was used. Anesthetic medications included Lidocaine 2%, Proparacaine 0.5%.   Procedure Preparation included 5% betadine to ocular surface, eyelid speculum. A supplied (32g) needle was used.   Injection: 1.25 mg Bevacizumab 1.252m0.05ml   Route: Intravitreal, Site: Left Eye   NDC: [id number]: [id number] date: 04/28/2021, Waste: 0 mL   Post-op Post injection exam found visual acuity of at least counting fingers. The patient tolerated the procedure well. There were no complications. The patient received written and verbal post procedure care education. Post injection medications were not given.            ASSESSMENT/PLAN:    ICD-10-CM   1. 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 Branch retinal vein occlusion of right eye with macular edema  H34.8310     3. Essential  hypertension   I10     4. Hypertensive retinopathy of both eyes  H35.033     5. Pseudophakia, both eyes  Z96.1      1. Moderate non-proliferative diabetic retinopathy, OU (OD>OS)  - s/p IVA OD #1 (12.31.21)  - s/p IVA OS #1 (01.04.22)  - s/p IVA OU #2 (02.04.22), #3 (03.04.22), #4 (04.01.22),#5 (05.05.22), #6 (07.15.22), #7 (11.09.22) - exam shows scattered MA/DBH OU, central edema OU improving - BCVA 20/25 OD, 20/20 OS  - OCT shows mild interval improvement in IRF OU at 6 weeks - recommend IVA OU #8 today, 12.21.22 w/ f/u in 6 wks - pt wishes to proceed with injection  - RBA of procedure discussed, questions answered  - Avastin informed consent obtained and signed (OU) - see procedure note - f/u in 6 weeks, DFE, OCT, possible injections  2. BRVO w/ CME OD  - s/p IVA OD as above - BCVA 20/25 -- stable - OCT shows interval improvement in IRF and IRHM - recommend IVA OD #8 today as above - F/U 6 weeks -- DFE/OCT/possible injection  3,4. Hypertensive retinopathy OU - discussed importance of tight BP control - monitor  5. Pseudophakia OU  - s/p CE/IOL OU (Dr. Herbert Deaner)  - IOLs in good position, doing well - monitor  Ophthalmic Meds Ordered this visit:  Meds ordered this encounter  Medications   Bevacizumab (AVASTIN) SOLN 1.25 mg   Bevacizumab (AVASTIN) SOLN 1.25 mg     Return in about 6 weeks (around 04/25/2021) for f/u NPDR OU, DFE, OCT.  There are no Patient Instructions on file for this visit.  Explained the diagnoses, plan, and follow up with the patient and they expressed understanding.  Patient expressed understanding of the importance of proper follow up care.   This document serves as a record of services personally performed by Gardiner Sleeper, MD, PhD. It was created on their behalf by San Jetty. Owens Shark, OA an ophthalmic technician. The creation of this record is the provider's dictation and/or activities during the visit.    Electronically signed by: San Jetty. Owens Shark, [place]  12.21.2022 4:43 PM  Gardiner Sleeper, M.D., Ph.D. Diseases & Surgery of the Retina and Vitreous [facility]  I have reviewed the above documentation for accuracy and completeness, and I agree with the above. Gardiner Sleeper, M.D., Ph.D. 03/14/21 4:45 PM   Abbreviations: M myopia (nearsighted); A astigmatism; H hyperopia (farsighted); P presbyopia; Mrx spectacle prescription;  CTL contact lenses; OD right eye; OS left eye; OU both eyes  XT exotropia; ET esotropia; PEK punctate epithelial keratitis; PEE punctate epithelial erosions; DES dry eye syndrome; MGD meibomian gland dysfunction; ATs artificial tears; PFAT's preservative free artificial tears; [place] nuclear sclerotic cataract; PSC posterior subcapsular cataract; ERM epi-retinal membrane; PVD posterior vitreous detachment; RD retinal detachment; DM diabetes mellitus; DR diabetic retinopathy; NPDR non-proliferative diabetic retinopathy; PDR proliferative diabetic retinopathy; CSME clinically significant macular edema; DME diabetic macular edema; dbh dot blot hemorrhages; CWS cotton wool spot; POAG primary open angle glaucoma; C/D cup-to-disc ratio; HVF humphrey visual field; GVF goldmann visual field; OCT optical coherence tomography; IOP intraocular pressure; BRVO Branch retinal vein occlusion; CRVO central retinal vein occlusion; CRAO central retinal artery occlusion; BRAO branch retinal artery occlusion; RT retinal tear; SB scleral buckle; PPV pars plana vitrectomy; VH Vitreous hemorrhage; PRP panretinal laser photocoagulation; IVK intravitreal kenalog; VMT vitreomacular traction; MH Macular hole;  NVD neovascularization of the disc; NVE neovascularization elsewhere; AREDS age related eye disease  study; ARMD age related macular degeneration; POAG primary open angle glaucoma; EBMD epithelial/anterior basement membrane dystrophy; ACIOL anterior chamber intraocular lens; IOL intraocular lens; PCIOL posterior chamber intraocular lens;  Phaco/IOL phacoemulsification with intraocular lens placement; [place] photorefractive keratectomy; LASIK laser assisted in situ keratomileusis; HTN hypertension; DM diabetes mellitus; COPD chronic obstructive pulmonary disease

## 2021-04-23 NOTE — Progress Notes (Signed)
[facility] Note  04/25/2021     CHIEF COMPLAINT Patient presents for Retina Follow Up   HISTORY OF PRESENT ILLNESS: Mcihael Hinderman is a 66 y.o. male who presents to the clinic today for:   HPI     Retina Follow Up   Patient presents with  Diabetic Retinopathy.  In left eye.  Severity is moderate.  Duration of 6 weeks.  Since onset it is stable.  I, the attending physician,  performed the HPI with the patient and updated documentation appropriately.        Comments   Pt here for 6 wk ret f/u fpr NPDR OU. Pt states vision is the same, no changes. BS was 150 this am. Last A1C taken was 6.8 in Nov 2022.       Last edited by Bernarda Caffey, MD on 04/29/2021 11:16 PM.    Patient has not noticed change in vision.  BS has been elevated. Patient will not change his eating habits per friend.    Referring physician: Kennieth Rad, MD [address] [place] [address]  HISTORICAL INFORMATION:   Selected notes from the MEDICAL RECORD NUMBER Referred by Dr. Monna Fam for concern of CRVO   CURRENT MEDICATIONS: No current outpatient medications on file. (Ophthalmic Drugs)   No current facility-administered medications for this visit. (Ophthalmic Drugs)   Current Outpatient Medications (Other)  Medication Sig   atorvastatin (LIPITOR) 10 MG tablet Take 10 mg by mouth daily.   blood glucose meter kit and supplies KIT Dispense based on patient and insurance preference. Use up to four times daily as directed. (FOR ICD-10 E11.65).   Blood Glucose Monitoring Suppl (ACCU-CHEK GUIDE) w/Device KIT 1 Piece by Does not apply route as directed.   buPROPion (WELLBUTRIN XL) 300 MG 24 hr tablet Take 300 mg by mouth daily.   cephALEXin (KEFLEX) 500 MG capsule Take 500 mg by mouth 2 (two) times daily.   cyanocobalamin 1000 MCG tablet Take by mouth.   donepezil (ARICEPT) 5 MG tablet Take by mouth.   doxycycline (ADOXA) 100 MG tablet Take 100 mg by mouth 2  (two) times daily. for 10 days   Dulaglutide (TRULICITY) 1.5 AO/1.3YQ SOPN Inject into the skin once a week.   escitalopram (LEXAPRO) 20 MG tablet Take by mouth.   FLUoxetine (PROZAC) 20 MG tablet daily.   FLUoxetine (PROZAC) 40 MG capsule 1 capsule DAILY (route: oral)   glipiZIDE (GLUCOTROL) 10 MG tablet Take 5 mg by mouth 2 (two) times daily with a meal.   glucose blood (ACCU-CHEK GUIDE) test strip Use as instructed   glucose blood test strip 1 each by Other route as needed. Use as instructed 4 x daily. E11.65. One touch ultra   hydrOXYzine (ATARAX/VISTARIL) 25 MG tablet Take 25 mg by mouth 2 (two) times daily.   insulin degludec (TRESIBA FLEXTOUCH) 100 UNIT/ML SOPN FlexTouch Pen Inject 0.2 mLs (20 Units total) into the skin daily.   insulin glargine, 2 Unit Dial, (TOUJEO MAX SOLOSTAR) 300 UNIT/ML Solostar Pen Inject into the skin.   Insulin Glargine-Lixisenatide 100-33 UNT-MCG/ML SOPN Inject into the skin.   insulin lispro (HUMALOG) 100 UNIT/ML KwikPen Inject into the skin.   Insulin Pen Needle (BD PEN NEEDLE NANO U/F) 32G X 4 MM MISC 1 each by Does not apply route at bedtime.   lansoprazole (PREVACID) 30 MG capsule Take by mouth.   linagliptin (TRADJENTA) 5 MG TABS tablet Take 1 tablet (5 mg total)  by mouth daily.   lisinopril (PRINIVIL,ZESTRIL) 2.5 MG tablet Take 2.5 mg by mouth daily.   metoCLOPramide (REGLAN) 10 MG tablet Take by mouth.   Misc. [facility] Bed Extension piece.   pantoprazole (PROTONIX) 40 MG tablet Take by mouth.   pioglitazone (ACTOS) 30 MG tablet Take by mouth.   No current facility-administered medications for this visit. (Other)   REVIEW OF SYSTEMS: ROS   Positive for: Neurological, Genitourinary, Endocrine, Cardiovascular, Eyes Negative for: Constitutional, Gastrointestinal, Skin, Musculoskeletal, HENT, Respiratory, Psychiatric, Allergic/Imm, Heme/Lymph Last edited by Kingsley Spittle, COT on 04/25/2021  1:57 PM.      ALLERGIES No Known  Allergies  PAST MEDICAL HISTORY Past Medical History:  Diagnosis Date   Diabetes ([place])    Diabetic retinopathy ([place])    NPDR OU   Hypertension    Hypertensive retinopathy    OU   Past Surgical History:  Procedure Laterality Date   CATARACT EXTRACTION Bilateral    Dr. Herbert Deaner   EYE SURGERY Bilateral    Cat Sx - Dr. Herbert Deaner   FAMILY HISTORY Family History  Problem Relation Age of Onset   Diabetes Father    Diabetes Sister    Diabetes Brother    SOCIAL HISTORY Social History   Tobacco Use   Smoking status: Never   Smokeless tobacco: Never  Vaping Use   Vaping Use: Never used  Substance Use Topics   Alcohol use: Never   Drug use: Never       OPHTHALMIC EXAM: Base Eye Exam     Visual Acuity (Snellen - Linear)       Right Left   Dist [place] 20/30 -1 20/20   Dist ph [place] NI          Tonometry (Tonopen, 2:03 PM)       Right Left   Pressure 13 15         Pupils       Dark Light Shape React APD   Right 2 1 Round Minimal None   Left 2 1 Round Minimal None         Visual Fields (Counting fingers)       Left Right    Full Full         Extraocular Movement       Right Left    Full, Ortho Full, Ortho         Neuro/Psych     Oriented x3: Yes   Mood/Affect: Normal         Dilation     Both eyes: 1.0% Mydriacyl, 2.5% Phenylephrine @ 2:03 PM           Slit Lamp and Fundus Exam     Slit Lamp Exam       Right Left   Lids/Lashes Dermatochalasis - upper lid Dermatochalasis - upper lid   Conjunctiva/Sclera White and quiet White and quiet   Cornea 2+ Punctate epithelial erosions, well healed temporal cataract wounds trace Punctate epithelial erosions, mild arcus, well healed temporal cataract wounds   Anterior Chamber deep, clear, narrow temporal angle Deep and quiet   Iris Round and dilated, No NVI Round and dilated, No NVI   Lens PC IOL in good position PC IOL in good position   Anterior Vitreous Vitreous syneresis Vitreous syneresis          Fundus Exam       Right Left   Disc mild Pallor, Sharp rim, no NVD Pink and Sharp   C/D  Ratio 0.2 0.3   Macula good foveal reflex, scattered MA/DBH/CWS, +edema - slightly increased, punctate exudates good foveal reflex, scattered IRH -- improved, +cystic changes, +edema - slightly increased, CWS, +punctate exudates   Vessels attenuated, Tortuous attenuated, Tortuous   Periphery Attached, scattered IRH/DBH/CWS greatest posteriorly Attached, scattered IRH/DBH/CWS greatest nasal to disc             IMAGING AND PROCEDURES  Imaging and Procedures for 04/25/2021  OCT, Retina - OU - Both Eyes       Right Eye Quality was good. Central Foveal Thickness: 346. Progression has worsened. Findings include abnormal foveal contour, intraretinal fluid, intraretinal hyper-reflective material, no SRF, vitreomacular adhesion (Interval increase in IRF in nasal macula and + IRHM).   Left Eye Quality was good. Central Foveal Thickness: 285. Progression has worsened. Findings include intraretinal fluid, no SRF, vitreomacular adhesion , intraretinal hyper-reflective material, normal foveal contour (Mild interval increase in IRF greatest SN mac).   Notes *Images captured and stored on drive  Diagnosis / Impression:  DME OU OD: Interval increase in IRF in nasal macula and + IRHM OS: Mild interval increase in IRF greatest SN mac  Clinical management:  See below  Abbreviations: NFP - Normal foveal profile. CME - cystoid macular edema. PED - pigment epithelial detachment. IRF - intraretinal fluid. SRF - subretinal fluid. EZ - ellipsoid zone. ERM - epiretinal membrane. ORA - outer retinal atrophy. ORT - outer retinal tubulation. SRHM - subretinal hyper-reflective material. IRHM - intraretinal hyper-reflective material      Intravitreal Injection, Pharmacologic Agent - OD - Right Eye       Time Out 04/25/2021. 3:17 PM. Confirmed correct patient, procedure, site, and patient consented.    Anesthesia Topical anesthesia was used. Anesthetic medications included Lidocaine 2%, Proparacaine 0.5%.   Procedure Preparation included eyelid speculum, 5% betadine to ocular surface. A supplied (32g) needle was used.   Injection: 1.25 mg Bevacizumab 1.14m/0.05ml   Route: Intravitreal, Site: Right Eye   NDC:: 19758-832-54 Lot: [id number] , Expiration date: 06/06/2021   Post-op Post injection exam found visual acuity of at least counting fingers. The patient tolerated the procedure well. There were no complications. The patient received written and verbal post procedure care education. Post injection medications were not given.      Intravitreal Injection, Pharmacologic Agent - OS - Left Eye       Time Out 04/25/2021. 3:18 PM. Confirmed correct patient, procedure, site, and patient consented.   Anesthesia Topical anesthesia was used. Anesthetic medications included Lidocaine 2%, Proparacaine 0.5%.   Procedure Preparation included 5% betadine to ocular surface, eyelid speculum. A (32g) needle was used.   Injection: 1.25 mg Bevacizumab 1.264m0.05ml   Route: Intravitreal, Site: Left Eye   NDC: : 98264-158-30Lot: [id number], Expiration date: 06/04/2021   Post-op Post injection exam found visual acuity of at least counting fingers. The patient tolerated the procedure well. There were no complications. The patient received written and verbal post procedure care education. Post injection medications were not given.             ASSESSMENT/PLAN:    ICD-10-CM   1. 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 Branch retinal vein occlusion of right eye  with macular edema  H34.8310     3. Essential hypertension  I10     4. Hypertensive retinopathy of both eyes  H35.033     5. Pseudophakia, both eyes  Z96.1       1. Moderate non-proliferative diabetic retinopathy, OU (OD>OS)  - s/p IVA OD #1 (12.31.21)  - s/p IVA OS #1 (01.04.22)  - s/p IVA OU #2 (02.04.22), #3 (03.04.22), #4 (04.01.22),#5 (05.05.22), #6 (07.15.22), #7 (11.09.22), #8 (12.21.22) - exam shows scattered MA/DBH OU, central edema OU improving - BCVA 20/25 OD, 20/20 OS  - OCT shows mild interval increase in IRF OU at 6 weeks  - recommend IVA OU #9 today, 02.01.23 w/ f/u in 5 wks - pt wishes to proceed with injection  - RBA of procedure discussed, questions answered  - Avastin informed consent obtained and signed (OU) - see procedure note - f/u in 5 weeks, DFE, OCT, possible injections  2. BRVO w/ CME OD  - s/p IVA OD as above - BCVA 20/25 -- stable - OCT shows interval increase in IRF and IRHM - recommend IVA OD #8 today as above - F/U 6 weeks -- DFE/OCT/possible injection  3,4. Hypertensive retinopathy OU - discussed importance of tight BP control - monitor  5. Pseudophakia OU  - s/p CE/IOL OU (Dr. Herbert Deaner)  - IOLs in good position, doing well - monitor  Ophthalmic Meds Ordered this visit:  Meds ordered this encounter  Medications   Bevacizumab (AVASTIN) SOLN 1.25 mg   Bevacizumab (AVASTIN) SOLN 1.25 mg     Return in about 5 weeks (around 05/30/2021) for DFE, OCT, possible injection.  There are no Patient Instructions on file for this visit.  Explained the diagnoses, plan, and follow up with the patient and they expressed understanding.  Patient expressed understanding of the importance of proper follow up care.   This document serves as a record of services personally performed by Gardiner Sleeper, MD, PhD. It was created on their behalf by Orvan Falconer, an ophthalmic technician. The creation of this record is the provider's dictation and/or  activities during the visit.    Electronically signed by: Orvan Falconer, OA, 04/29/21  11:40 PM  This document serves as a record of services personally performed by Gardiner Sleeper, MD, PhD. It was created on their behalf by Leonie Douglas, an ophthalmic technician. The creation of this record is the provider's dictation and/or activities during the visit.    Electronically signed by: Leonie Douglas COA, 04/29/21  11:40 PM  Gardiner Sleeper, M.D., Ph.D. Diseases & Surgery of the Retina and Vitreous [facility]  I have reviewed the above documentation for accuracy and completeness, and I agree with the above. Gardiner Sleeper, M.D., Ph.D. 04/29/21 11:41 PM  Abbreviations: M myopia (nearsighted); A astigmatism; H hyperopia (farsighted); P presbyopia; Mrx spectacle prescription;  CTL contact lenses; OD right eye; OS left eye; OU both eyes  XT exotropia; ET esotropia; PEK punctate epithelial keratitis; PEE punctate epithelial erosions; DES dry eye syndrome; MGD meibomian gland dysfunction; ATs artificial tears; PFAT's preservative free artificial tears; [place] nuclear sclerotic cataract; PSC posterior subcapsular cataract; ERM epi-retinal membrane; PVD posterior vitreous detachment; RD retinal detachment; DM diabetes mellitus; DR diabetic retinopathy; NPDR non-proliferative diabetic retinopathy; PDR proliferative diabetic retinopathy; CSME clinically significant macular edema; DME diabetic macular edema; dbh dot blot hemorrhages; CWS cotton wool spot; POAG primary open angle glaucoma; C/D cup-to-disc ratio;  HVF humphrey visual field; GVF goldmann visual field; OCT optical coherence tomography; IOP intraocular pressure; BRVO Branch retinal vein occlusion; CRVO central retinal vein occlusion; CRAO central retinal artery occlusion; BRAO branch retinal artery occlusion; RT retinal tear; SB scleral buckle; PPV pars plana vitrectomy; VH Vitreous hemorrhage; PRP panretinal laser  photocoagulation; IVK intravitreal kenalog; VMT vitreomacular traction; MH Macular hole;  NVD neovascularization of the disc; NVE neovascularization elsewhere; AREDS age related eye disease study; ARMD age related macular degeneration; POAG primary open angle glaucoma; EBMD epithelial/anterior basement membrane dystrophy; ACIOL anterior chamber intraocular lens; IOL intraocular lens; PCIOL posterior chamber intraocular lens; Phaco/IOL phacoemulsification with intraocular lens placement; [place] photorefractive keratectomy; LASIK laser assisted in situ keratomileusis; HTN hypertension; DM diabetes mellitus; COPD chronic obstructive pulmonary disease

## 2021-04-25 ENCOUNTER — Ambulatory Visit (INDEPENDENT_AMBULATORY_CARE_PROVIDER_SITE_OTHER): Payer: Medicare Other | Admitting: Ophthalmology

## 2021-04-25 ENCOUNTER — Encounter (INDEPENDENT_AMBULATORY_CARE_PROVIDER_SITE_OTHER): Payer: Self-pay | Admitting: Ophthalmology

## 2021-04-25 ENCOUNTER — Other Ambulatory Visit: Payer: Self-pay

## 2021-04-25 DIAGNOSIS — I10 Essential (primary) hypertension: Secondary | ICD-10-CM

## 2021-04-25 DIAGNOSIS — H34831 Tributary (branch) retinal vein occlusion, right eye, with macular edema: Secondary | ICD-10-CM

## 2021-04-25 DIAGNOSIS — H35033 Hypertensive retinopathy, bilateral: Secondary | ICD-10-CM | POA: Diagnosis not present

## 2021-04-25 DIAGNOSIS — E113313 Type 2 diabetes mellitus with moderate nonproliferative diabetic retinopathy with macular edema, bilateral: Secondary | ICD-10-CM

## 2021-04-25 DIAGNOSIS — Z961 Presence of intraocular lens: Secondary | ICD-10-CM

## 2021-04-29 ENCOUNTER — Encounter (INDEPENDENT_AMBULATORY_CARE_PROVIDER_SITE_OTHER): Payer: Self-pay | Admitting: Ophthalmology

## 2021-04-29 MED ORDER — BEVACIZUMAB CHEMO INJECTION 1.25MG/0.05ML SYRINGE FOR KALEIDOSCOPE
1.2500 mg | INTRAVITREAL | Status: AC | PRN
  Administered 2021-04-29: 1.25 mg via INTRAVITREAL

## 2021-05-21 NOTE — Progress Notes (Signed)
[facility] Note  05/23/2021     CHIEF COMPLAINT Patient presents for Retina Follow Up   HISTORY OF PRESENT ILLNESS: Thomas Finley is a 66 y.o. male who presents to the clinic today for:   HPI     Retina Follow Up   Patient presents with  Diabetic Retinopathy.  In both eyes.  This started 4 weeks ago.  I, the attending physician,  performed the HPI with the patient and updated documentation appropriately.        Comments   Patient here for 4 weeks retina follow up for NPDR OU. Patient states vision doing ok. No eye pain.       Last edited by Bernarda Caffey, MD on 05/23/2021  5:09 PM.      Referring physician: Kennieth Rad, MD [address] [place] [address]  HISTORICAL INFORMATION:   Selected notes from the MEDICAL RECORD NUMBER Referred by Dr. Monna Fam for concern of CRVO   CURRENT MEDICATIONS: No current outpatient medications on file. (Ophthalmic Drugs)   No current facility-administered medications for this visit. (Ophthalmic Drugs)   Current Outpatient Medications (Other)  Medication Sig   atorvastatin (LIPITOR) 10 MG tablet Take 10 mg by mouth daily.   blood glucose meter kit and supplies KIT Dispense based on patient and insurance preference. Use up to four times daily as directed. (FOR ICD-10 E11.65).   Blood Glucose Monitoring Suppl (ACCU-CHEK GUIDE) w/Device KIT 1 Piece by Does not apply route as directed.   buPROPion (WELLBUTRIN XL) 300 MG 24 hr tablet Take 300 mg by mouth daily.   cephALEXin (KEFLEX) 500 MG capsule Take 500 mg by mouth 2 (two) times daily.   cyanocobalamin 1000 MCG tablet Take by mouth.   donepezil (ARICEPT) 5 MG tablet Take by mouth.   doxycycline (ADOXA) 100 MG tablet Take 100 mg by mouth 2 (two) times daily. for 10 days   Dulaglutide (TRULICITY) 1.5 HU/7.6LY SOPN Inject into the skin once a week.   escitalopram (LEXAPRO) 20 MG tablet Take by mouth.   FLUoxetine (PROZAC) 20 MG tablet  daily.   FLUoxetine (PROZAC) 40 MG capsule 1 capsule DAILY (route: oral)   glipiZIDE (GLUCOTROL) 10 MG tablet Take 5 mg by mouth 2 (two) times daily with a meal.   glucose blood (ACCU-CHEK GUIDE) test strip Use as instructed   glucose blood test strip 1 each by Other route as needed. Use as instructed 4 x daily. E11.65. One touch ultra   hydrOXYzine (ATARAX/VISTARIL) 25 MG tablet Take 25 mg by mouth 2 (two) times daily.   insulin degludec (TRESIBA FLEXTOUCH) 100 UNIT/ML SOPN FlexTouch Pen Inject 0.2 mLs (20 Units total) into the skin daily.   insulin glargine, 2 Unit Dial, (TOUJEO MAX SOLOSTAR) 300 UNIT/ML Solostar Pen Inject into the skin.   Insulin Glargine-Lixisenatide 100-33 UNT-MCG/ML SOPN Inject into the skin.   insulin lispro (HUMALOG) 100 UNIT/ML KwikPen Inject into the skin.   Insulin Pen Needle (BD PEN NEEDLE NANO U/F) 32G X 4 MM MISC 1 each by Does not apply route at bedtime.   lansoprazole (PREVACID) 30 MG capsule Take by mouth.   linagliptin (TRADJENTA) 5 MG TABS tablet Take 1 tablet (5 mg total) by mouth daily.   lisinopril (PRINIVIL,ZESTRIL) 2.5 MG tablet Take 2.5 mg by mouth daily.   metoCLOPramide (REGLAN) 10 MG tablet Take by mouth.   Misc. [facility] Bed Extension piece.   pantoprazole (PROTONIX) 40 MG tablet  Take by mouth.   pioglitazone (ACTOS) 30 MG tablet Take by mouth.   No current facility-administered medications for this visit. (Other)   REVIEW OF SYSTEMS: ROS   Positive for: Neurological, Genitourinary, Endocrine, Cardiovascular, Eyes Negative for: Constitutional, Gastrointestinal, Skin, Musculoskeletal, HENT, Respiratory, Psychiatric, Allergic/Imm, Heme/Lymph Last edited by Theodore Demark, COA on 05/23/2021  1:55 PM.     ALLERGIES No Known Allergies  PAST MEDICAL HISTORY Past Medical History:  Diagnosis Date   Diabetes ([place])    Diabetic retinopathy ([place])    NPDR OU   Hypertension    Hypertensive retinopathy    OU   Past Surgical  History:  Procedure Laterality Date   CATARACT EXTRACTION Bilateral    Dr. Herbert Deaner   EYE SURGERY Bilateral    Cat Sx - Dr. Herbert Deaner   FAMILY HISTORY Family History  Problem Relation Age of Onset   Diabetes Father    Diabetes Sister    Diabetes Brother    SOCIAL HISTORY Social History   Tobacco Use   Smoking status: Never   Smokeless tobacco: Never  Vaping Use   Vaping Use: Never used  Substance Use Topics   Alcohol use: Never   Drug use: Never       OPHTHALMIC EXAM: Base Eye Exam     Visual Acuity (Snellen - Linear)       Right Left   Dist [place] 20/25 -2 20/20 -1   Dist ph [place] NI          Tonometry (Tonopen, 1:53 PM)       Right Left   Pressure 17 16         Pupils       Dark Light Shape React APD   Right 2 1 Round Minimal None   Left 2 1 Round Minimal None         Visual Fields (Counting fingers)       Left Right    Full Full         Extraocular Movement       Right Left    Full, Ortho Full, Ortho         Neuro/Psych     Oriented x3: Yes   Mood/Affect: Normal         Dilation     Both eyes: 1.0% Mydriacyl, 2.5% Phenylephrine @ 1:53 PM           Slit Lamp and Fundus Exam     Slit Lamp Exam       Right Left   Lids/Lashes Dermatochalasis - upper lid Dermatochalasis - upper lid   Conjunctiva/Sclera White and quiet White and quiet   Cornea 2+ Punctate epithelial erosions, well healed temporal cataract wounds trace Punctate epithelial erosions, mild arcus, well healed temporal cataract wounds   Anterior Chamber deep, clear, narrow temporal angle Deep and quiet   Iris Round and dilated, No NVI Round and dilated, No NVI   Lens PC IOL in good position PC IOL in good position   Anterior Vitreous Vitreous syneresis Vitreous syneresis         Fundus Exam       Right Left   Disc mild Pallor, Sharp rim, no NVD Pink and Sharp   C/D Ratio 0.2 0.3   Macula good foveal reflex, scattered MA/DBH/CWS, +edema - slightly improved,  punctate exudates good foveal reflex, scattered IRH -- improved, +cystic changes, +non-central edema - slightly increased, CWS, +punctate exudates   Vessels attenuated, Tortuous attenuated, Tortuous  Periphery Attached, scattered IRH/DBH/CWS greatest posteriorly - improved Attached, scattered IRH/DBH/CWS greatest nasal to disc            IMAGING AND PROCEDURES  Imaging and Procedures for 05/23/2021  OCT, Retina - OU - Both Eyes       Right Eye Quality was good. Central Foveal Thickness: 298. Progression has improved. Findings include abnormal foveal contour, intraretinal fluid, intraretinal hyper-reflective material, no SRF, vitreomacular adhesion (Interval improvement in IRF/IRHM SN macula and fovea).   Left Eye Quality was good. Central Foveal Thickness: 279. Progression has worsened. Findings include intraretinal fluid, no SRF, vitreomacular adhesion , intraretinal hyper-reflective material, normal foveal contour (persistent IRF/IRHM, focal increase SN mac).   Notes *Images captured and stored on drive  Diagnosis / Impression:  DME OU OD: Interval improvement in IRF/IRHM SN macula and fovea OS: persistent IRF/IRHM, focal increase SN mac  Clinical management:  See below  Abbreviations: NFP - Normal foveal profile. CME - cystoid macular edema. PED - pigment epithelial detachment. IRF - intraretinal fluid. SRF - subretinal fluid. EZ - ellipsoid zone. ERM - epiretinal membrane. ORA - outer retinal atrophy. ORT - outer retinal tubulation. SRHM - subretinal hyper-reflective material. IRHM - intraretinal hyper-reflective material      Intravitreal Injection, Pharmacologic Agent - OD - Right Eye       Time Out 05/23/2021. 2:51 PM. Confirmed correct patient, procedure, site, and patient consented.   Anesthesia Topical anesthesia was used. Anesthetic medications included Lidocaine 2%, Proparacaine 0.5%.   Procedure Preparation included eyelid speculum, 5% betadine to ocular  surface. A supplied (32g) needle was used.   Injection: 1.25 mg Bevacizumab 1.24m/0.05ml   Route: Intravitreal, Site: Right Eye   NDC:: 97353-299-24 Lot: [id number] , Expiration date: 06/20/2021   Post-op Post injection exam found visual acuity of at least counting fingers. The patient tolerated the procedure well. There were no complications. The patient received written and verbal post procedure care education. Post injection medications were not given.      Intravitreal Injection, Pharmacologic Agent - OS - Left Eye       Time Out 05/23/2021. 2:51 PM. Confirmed correct patient, procedure, site, and patient consented.   Anesthesia Topical anesthesia was used. Anesthetic medications included Lidocaine 2%, Proparacaine 0.5%.   Procedure Preparation included 5% betadine to ocular surface, eyelid speculum. A supplied (32g) needle was used.   Injection: 1.25 mg Bevacizumab 1.240m0.05ml   Route: Intravitreal, Site: Left Eye   NDC: : 26834-196-22Lot: : [id number] date: 06/18/2021   Post-op Post injection exam found visual acuity of at least counting fingers. The patient tolerated the procedure well. There were no complications. The patient received written and verbal post procedure care education. Post injection medications were not given.            ASSESSMENT/PLAN:    ICD-10-CM   1. 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 Branch retinal vein occlusion of right eye with macular edema  H34.8310     3. Essential hypertension  I10     4. Hypertensive retinopathy of both eyes  H35.033     5. Pseudophakia, both eyes  Z96.1  1. Moderate  non-proliferative diabetic retinopathy, OU (OD>OS)  - s/p IVA OD #1 (12.31.21)  - s/p IVA OS #1 (01.04.22)  - s/p IVA OU #2 (02.04.22), #3 (03.04.22), #4 (04.01.22),#5 (05.05.22), #6 (07.15.22), #7 (11.09.22), #8 (12.21.22), #9 (02.01.23) - exam shows scattered MA/DBH OU, central edema OU improving - BCVA 20/25 OD, 20/20 OS  - OCT shows OD: Interval improvement in IRF/IRHM SN macula and fovea; OS: persistent IRF/IRHM, focal increase SN mac at 4 weeks - recommend IVA OU #10 today, 03.01.23 w/ f/u in 4-5 wks - pt wishes to proceed with injection  - RBA of procedure discussed, questions answered  - Avastin informed consent obtained and signed (OU) - see procedure note - f/u in 4-5 weeks, DFE, OCT, possible injections  2. BRVO w/ CME OD  - s/p IVA OD as above - BCVA 20/25 -- stable - OCT shows interval improvement in IRF/IRHM SN macula and fovea - recommend IVA OD #10 today as above - F/U 4-5 weeks -- DFE/OCT/possible injection  3,4. Hypertensive retinopathy OU - discussed importance of tight BP control - monitor  5. Pseudophakia OU  - s/p CE/IOL OU (Dr. Herbert Deaner)  - IOLs in good position, doing well - monitor  Ophthalmic Meds Ordered this visit:  Meds ordered this encounter  Medications   Bevacizumab (AVASTIN) SOLN 1.25 mg   Bevacizumab (AVASTIN) SOLN 1.25 mg     Return for f/u 4-5 weeks, NPDR OU, DFE, OCT.  There are no Patient Instructions on file for this visit.  Explained the diagnoses, plan, and follow up with the patient and they expressed understanding.  Patient expressed understanding of the importance of proper follow up care.   This document serves as a record of services personally performed by Gardiner Sleeper, MD, PhD. It was created on their behalf by Orvan Falconer, an ophthalmic technician. The creation of this record is the provider's dictation and/or activities during the visit.    Electronically signed by: Orvan Falconer, OA, 05/23/21  5:11 PM  This  document serves as a record of services personally performed by Gardiner Sleeper, MD, PhD. It was created on their behalf by San Jetty. Owens Shark, OA an ophthalmic technician. The creation of this record is the provider's dictation and/or activities during the visit.    Electronically signed by: San Jetty. [place], [place] 03.01.2023 5:11 PM  Gardiner Sleeper, M.D., Ph.D. Diseases & Surgery of the Retina and Vitreous [facility]  I have reviewed the above documentation for accuracy and completeness, and I agree with the above. Gardiner Sleeper, M.D., Ph.D. 05/23/21 5:17 PM   Abbreviations: M myopia (nearsighted); A astigmatism; H hyperopia (farsighted); P presbyopia; Mrx spectacle prescription;  CTL contact lenses; OD right eye; OS left eye; OU both eyes  XT exotropia; ET esotropia; PEK punctate epithelial keratitis; PEE punctate epithelial erosions; DES dry eye syndrome; MGD meibomian gland dysfunction; ATs artificial tears; PFAT's preservative free artificial tears; Coward nuclear sclerotic cataract; PSC posterior subcapsular cataract; ERM epi-retinal membrane; PVD posterior vitreous detachment; RD retinal detachment; DM diabetes mellitus; DR diabetic retinopathy; NPDR non-proliferative diabetic retinopathy; PDR proliferative diabetic retinopathy; CSME clinically significant macular edema; DME diabetic macular edema; dbh dot blot hemorrhages; CWS cotton wool spot; POAG primary open angle glaucoma; C/D cup-to-disc ratio; HVF humphrey visual field; GVF goldmann visual field; OCT optical coherence tomography; IOP intraocular pressure; BRVO Branch retinal vein occlusion; CRVO central retinal vein occlusion; CRAO central retinal artery occlusion; BRAO branch retinal artery occlusion; RT retinal  tear; SB scleral buckle; PPV pars plana vitrectomy; VH Vitreous hemorrhage; PRP panretinal laser photocoagulation; IVK intravitreal kenalog; VMT vitreomacular traction; MH Macular hole;  NVD neovascularization of  the disc; NVE neovascularization elsewhere; AREDS age related eye disease study; ARMD age related macular degeneration; POAG primary open angle glaucoma; EBMD epithelial/anterior basement membrane dystrophy; ACIOL anterior chamber intraocular lens; IOL intraocular lens; PCIOL posterior chamber intraocular lens; Phaco/IOL phacoemulsification with intraocular lens placement; Haynes photorefractive keratectomy; LASIK laser assisted in situ keratomileusis; HTN hypertension; DM diabetes mellitus; COPD chronic obstructive pulmonary disease

## 2021-05-23 ENCOUNTER — Ambulatory Visit (INDEPENDENT_AMBULATORY_CARE_PROVIDER_SITE_OTHER): Payer: Medicare Other | Admitting: Ophthalmology

## 2021-05-23 ENCOUNTER — Encounter (INDEPENDENT_AMBULATORY_CARE_PROVIDER_SITE_OTHER): Payer: Self-pay | Admitting: Ophthalmology

## 2021-05-23 ENCOUNTER — Other Ambulatory Visit: Payer: Self-pay

## 2021-05-23 DIAGNOSIS — H35033 Hypertensive retinopathy, bilateral: Secondary | ICD-10-CM | POA: Diagnosis not present

## 2021-05-23 DIAGNOSIS — H34831 Tributary (branch) retinal vein occlusion, right eye, with macular edema: Secondary | ICD-10-CM

## 2021-05-23 DIAGNOSIS — E113313 Type 2 diabetes mellitus with moderate nonproliferative diabetic retinopathy with macular edema, bilateral: Secondary | ICD-10-CM

## 2021-05-23 DIAGNOSIS — I10 Essential (primary) hypertension: Secondary | ICD-10-CM

## 2021-05-23 DIAGNOSIS — Z961 Presence of intraocular lens: Secondary | ICD-10-CM

## 2021-05-23 MED ORDER — BEVACIZUMAB CHEMO INJECTION 1.25MG/0.05ML SYRINGE FOR KALEIDOSCOPE
1.2500 mg | INTRAVITREAL | Status: AC | PRN
  Administered 2021-05-23: 1.25 mg via INTRAVITREAL

## 2021-05-29 ENCOUNTER — Other Ambulatory Visit: Payer: Medicare Other | Admitting: *Deleted

## 2021-05-29 ENCOUNTER — Other Ambulatory Visit: Payer: Self-pay

## 2021-05-29 DIAGNOSIS — Z515 Encounter for palliative care: Secondary | ICD-10-CM

## 2021-05-29 NOTE — Progress Notes (Signed)
[facility] RN NOTE ? ?PATIENT NAME: Thomas Finley ?DOB: 10-Jun-1955 ?MRN: [id number] ? ?PRIMARY CARE PROVIDER: Aggie Cosier, MD ? ?Acct ID - Guarantor Home Phone Work Phone Relationship Acct Type  ?[id number] - Finley,Thomas [phone]  Self P/F  ?   [address], [place], [place] [address]  ? ?Due to the COVID-19 crisis, this virtual check-in visit was done via telephone from my office and it was initiated and consent by this patient and or family. ? ?Rn follow up telephonic encounter completed with patient's caregiver RC. He states that patient's condition has been mainly unchanged since the last visit was made on 04/05/2021 by Dr. Renato Gails. There have been no changes made to his medications or plan of care. He continues with ups and downs with his blood sugars. Yesterday it was 159, 156 and 216. He experiences tremors at times due to his blood sugars. He is taking his medications as prescribed. He has an appointment with Neurology this evening for a follow up. RC says that patient was discharged from home health on 05/01/21. They were visiting to treat a wound on his head but states that it is not going to heal. RC has no questions or concerns at this time. Made sure he has palliative care's main number and advised to call us with any needs. Palliative care will continue to follow. ? ? ?(Duration of visit and documentation 20 minutes) ? ? ? ?Candiss Norse, RN BSN ? ?

## 2021-06-25 NOTE — Progress Notes (Signed)
?[facility] Note ? ?06/27/2021 ? ?  ? ?CHIEF COMPLAINT ?Patient presents for Retina Follow Up ? ? ?HISTORY OF PRESENT ILLNESS: ?Thomas Finley is a 66 y.o. male who presents to the clinic today for:  ? ?HPI   ? ? Retina Follow Up   ?Patient presents with  Diabetic Retinopathy.  In both eyes.  Duration of 5 weeks.  Since onset it is stable.  I, the attending physician,  performed the HPI with the patient and updated documentation appropriately. ? ?  ?  ? ? Comments   ?5 week follow up NPDR OU- Doing well, no changes, vision stable. ?BS 155 this morning ?A1C maybe 7, can't remember ? ?  ?  ?Last edited by Bernarda Caffey, MD on 06/27/2021  5:46 PM.  ?  ?Pt states no change in vision, pts friend states his blood sugar has not been doing well ? ?Referring physician: ?Kennieth Rad, MD ?[address] ?[place] [address] ? ?HISTORICAL INFORMATION:  ? ?Selected notes from the [facility] ?Referred by Dr. Monna Fam for concern of CRVO  ? ?CURRENT MEDICATIONS: ?No current outpatient medications on file. (Ophthalmic Drugs)  ? ?No current facility-administered medications for this visit. (Ophthalmic Drugs)  ? ?Current Outpatient Medications (Other)  ?Medication Sig  ? atorvastatin (LIPITOR) 10 MG tablet Take 10 mg by mouth daily.  ? buPROPion (WELLBUTRIN XL) 300 MG 24 hr tablet Take 300 mg by mouth daily.  ? cyanocobalamin 1000 MCG tablet Take by mouth.  ? donepezil (ARICEPT) 5 MG tablet Take by mouth.  ? doxycycline (ADOXA) 100 MG tablet Take 100 mg by mouth 2 (two) times daily. for 10 days  ? Dulaglutide (TRULICITY) 1.5 GG/2.6RS SOPN Inject into the skin once a week.  ? escitalopram (LEXAPRO) 20 MG tablet Take by mouth.  ? fexofenadine (ALLEGRA) 180 MG tablet Take 180 mg by mouth daily.  ? FLUoxetine (PROZAC) 20 MG tablet daily.  ? FLUoxetine (PROZAC) 40 MG capsule 1 capsule DAILY (route: oral)  ? glipiZIDE (GLUCOTROL) 10 MG tablet Take 5 mg by mouth 2 (two) times daily with  a meal.  ? hydrOXYzine (ATARAX/VISTARIL) 25 MG tablet Take 25 mg by mouth 2 (two) times daily.  ? insulin degludec (TRESIBA FLEXTOUCH) 100 UNIT/ML SOPN FlexTouch Pen Inject 0.2 mLs (20 Units total) into the skin daily.  ? insulin glargine, 2 Unit Dial, (TOUJEO MAX SOLOSTAR) 300 UNIT/ML Solostar Pen Inject into the skin.  ? Insulin Glargine-Lixisenatide 100-33 UNT-MCG/ML SOPN Inject into the skin.  ? insulin lispro (HUMALOG) 100 UNIT/ML KwikPen Inject into the skin.  ? linagliptin (TRADJENTA) 5 MG TABS tablet Take 1 tablet (5 mg total) by mouth daily.  ? lisinopril (PRINIVIL,ZESTRIL) 2.5 MG tablet Take 2.5 mg by mouth daily.  ? metoCLOPramide (REGLAN) 10 MG tablet Take by mouth.  ? Misc. [facility] Bed Extension piece.  ? pantoprazole (PROTONIX) 40 MG tablet Take by mouth.  ? pioglitazone (ACTOS) 30 MG tablet Take by mouth.  ? blood glucose meter kit and supplies KIT Dispense based on patient and insurance preference. Use up to four times daily as directed. (FOR ICD-10 E11.65).  ? Blood Glucose Monitoring Suppl (ACCU-CHEK GUIDE) w/Device KIT 1 Piece by Does not apply route as directed.  ? cephALEXin (KEFLEX) 500 MG capsule Take 500 mg by mouth 2 (two) times daily. (Patient not taking: Reported on 06/27/2021)  ? glucose blood (ACCU-CHEK GUIDE) test strip Use as instructed  ? glucose  blood test strip 1 each by Other route as needed. Use as instructed 4 x daily. E11.65. One touch ultra  ? Insulin Pen Needle (BD PEN NEEDLE NANO U/F) 32G X 4 MM MISC 1 each by Does not apply route at bedtime.  ? lansoprazole (PREVACID) 30 MG capsule Take by mouth.  ? ?No current facility-administered medications for this visit. (Other)  ? ?REVIEW OF SYSTEMS: ?ROS   ?Positive for: Neurological, Genitourinary, Endocrine, Cardiovascular, Eyes ?Negative for: Constitutional, Gastrointestinal, Skin, Musculoskeletal, HENT, Respiratory, Psychiatric, Allergic/Imm, Heme/Lymph ?Last edited by Leonie Douglas, COA on 06/27/2021  1:45 PM.  ?   ? ?ALLERGIES ?No Known Allergies ? ?PAST MEDICAL HISTORY ?Past Medical History:  ?Diagnosis Date  ? Diabetes ([place])   ? Diabetic retinopathy ([place])   ? NPDR OU  ? Hypertension   ? Hypertensive retinopathy   ? OU  ? ?Past Surgical History:  ?Procedure Laterality Date  ? CATARACT EXTRACTION Bilateral   ? Dr. Herbert Deaner  ? EYE SURGERY Bilateral   ? Cat Sx - Dr. Herbert Deaner  ? ?FAMILY HISTORY ?Family History  ?Problem Relation Age of Onset  ? Diabetes Father   ? Diabetes Sister   ? Diabetes Brother   ? ?SOCIAL HISTORY ?Social History  ? ?Tobacco Use  ? Smoking status: Never  ? Smokeless tobacco: Never  ?Vaping Use  ? Vaping Use: Never used  ?Substance Use Topics  ? Alcohol use: Never  ? Drug use: Never  ?  ? ?  ?OPHTHALMIC EXAM: ?Base Eye Exam   ? ? Visual Acuity (Snellen - Linear)   ? ?   Right Left  ? Dist [place] 20/25- 20/20-  ? Dist ph [place] NI   ? ?  ?  ? ? Tonometry (Tonopen, 1:53 PM)   ? ?   Right Left  ? Pressure 15 13  ? ?  ?  ? ? Pupils   ? ?   Dark Light Shape React APD  ? Right 2 1 Round Slow None  ? Left 2 1 Round Slow None  ? ?  ?  ? ? Visual Fields (Counting fingers)   ? ?   Left Right  ?  Full Full  ?? Reliability OS ? ?  ?  ? ? Extraocular Movement   ? ?   Right Left  ?  Full Full  ? ?  ?  ? ? Neuro/Psych   ? ? Oriented x3: Yes  ? Mood/Affect: Normal  ? ?  ?  ? ? Dilation   ? ? Both eyes: 1.0% Mydriacyl, 2.5% Phenylephrine @ 1:53 PM  ? ?  ?  ? ?  ? ?Slit Lamp and Fundus Exam   ? ? Slit Lamp Exam   ? ?   Right Left  ? Lids/Lashes Dermatochalasis - upper lid Dermatochalasis - upper lid  ? Conjunctiva/Sclera White and quiet White and quiet  ? Cornea 2+ Punctate epithelial erosions, well healed temporal cataract wounds trace Punctate epithelial erosions, mild arcus, well healed temporal cataract wounds  ? Anterior Chamber deep, clear, narrow temporal angle Deep and quiet  ? Iris Round and dilated, No NVI Round and dilated, No NVI  ? Lens PC IOL in good position PC IOL in good position  ? Anterior Vitreous Vitreous syneresis  Vitreous syneresis  ? ?  ?  ? ? Fundus Exam   ? ?   Right Left  ? Disc mild Pallor, Sharp rim, no NVD Pink and Sharp  ? C/D Ratio 0.2 0.3  ?  Macula good foveal reflex, scattered MA/DBH/CWS, +edema - slightly improved, punctate exudates - improving good foveal reflex, scattered IRH -- improved, +cystic changes, +non-central edema - improved, CWS, +punctate exudates - improved  ? Vessels attenuated, Tortuous attenuated, Tortuous  ? Periphery Attached, scattered IRH/DBH/CWS greatest posteriorly - improved Attached, scattered IRH/DBH/CWS greatest nasal to disc  ? ?  ?  ? ?  ? ?IMAGING AND PROCEDURES  ?Imaging and Procedures for 06/27/2021 ? ?OCT, Retina - OU - Both Eyes   ? ?   ?Right Eye ?Quality was good. Central Foveal Thickness: 289. Progression has improved. Findings include abnormal foveal contour, intraretinal fluid, intraretinal hyper-reflective material, no SRF, vitreomacular adhesion (Mild interval improvement in IRF SN macula and fovea).  ? ?Left Eye ?Quality was good. Central Foveal Thickness: 276. Progression has improved. Findings include intraretinal fluid, no SRF, vitreomacular adhesion , intraretinal hyper-reflective material, normal foveal contour (Mild interval improvement in IRF SN macula, inferior macula persistent).  ? ?Notes ?*Images captured and stored on drive ? ?Diagnosis / Impression:  ?DME OU ?OD: Mild interval improvement in IRF SN macula and fovea ?OS: Mild interval improvement in IRF SN macula, inferior macula persistent ? ?Clinical management:  ?See below ? ?Abbreviations: NFP - Normal foveal profile. CME - cystoid macular edema. PED - pigment epithelial detachment. IRF - intraretinal fluid. SRF - subretinal fluid. EZ - ellipsoid zone. ERM - epiretinal membrane. ORA - outer retinal atrophy. ORT - outer retinal tubulation. SRHM - subretinal hyper-reflective material. IRHM - intraretinal hyper-reflective material ? ? ?  ? ?Intravitreal Injection, Pharmacologic Agent - OD - Right Eye   ? ?    ?Time Out ?06/27/2021. 2:15 PM. Confirmed correct patient, procedure, site, and patient consented.  ? ?Anesthesia ?Topical anesthesia was used. Anesthetic medications included Lidocaine 2%, Proparacaine 0.5

## 2021-06-27 ENCOUNTER — Ambulatory Visit (INDEPENDENT_AMBULATORY_CARE_PROVIDER_SITE_OTHER): Payer: Medicare Other | Admitting: Ophthalmology

## 2021-06-27 ENCOUNTER — Encounter (INDEPENDENT_AMBULATORY_CARE_PROVIDER_SITE_OTHER): Payer: Self-pay | Admitting: Ophthalmology

## 2021-06-27 DIAGNOSIS — H34831 Tributary (branch) retinal vein occlusion, right eye, with macular edema: Secondary | ICD-10-CM | POA: Diagnosis not present

## 2021-06-27 DIAGNOSIS — Z961 Presence of intraocular lens: Secondary | ICD-10-CM

## 2021-06-27 DIAGNOSIS — I10 Essential (primary) hypertension: Secondary | ICD-10-CM

## 2021-06-27 DIAGNOSIS — E113313 Type 2 diabetes mellitus with moderate nonproliferative diabetic retinopathy with macular edema, bilateral: Secondary | ICD-10-CM | POA: Diagnosis not present

## 2021-06-27 DIAGNOSIS — H35033 Hypertensive retinopathy, bilateral: Secondary | ICD-10-CM | POA: Diagnosis not present

## 2021-06-27 MED ORDER — BEVACIZUMAB CHEMO INJECTION 1.25MG/0.05ML SYRINGE FOR KALEIDOSCOPE
1.2500 mg | INTRAVITREAL | Status: AC | PRN
  Administered 2021-06-27: 1.25 mg via INTRAVITREAL

## 2021-07-23 NOTE — Progress Notes (Signed)
?[facility] Note ? ?08/01/2021 ? ?  ? ?CHIEF COMPLAINT ?Patient presents for Retina Follow Up ? ? ?HISTORY OF PRESENT ILLNESS: ?Thomas Finley is a 66 y.o. male who presents to the clinic today for:  ? ?HPI   ? ? Retina Follow Up   ?Patient presents with  Diabetic Retinopathy.  In both eyes.  Duration of 5 weeks.  Since onset it is stable.  I, the attending physician,  performed the HPI with the patient and updated documentation appropriately. ? ?  ?  ? ? Comments   ?5 week NPDR OU- Thomas Finley has been a little out of it since the weekend. His BS has been running high.  Running in 200's.  294 last night. 161 this morning.  His ankles are starting to swell and not urinating as much.  He will see his PCP tomorrow.   ? ?  ?  ?Last edited by Bernarda Caffey, MD on 08/01/2021  4:49 PM.  ?  ?Pt states VA unchanged. Sugar levels reportedly up and down regardless of diet.  ? ? ?Referring physician: ?Kennieth Rad, MD ?[address] ?[place] [address] ? ?HISTORICAL INFORMATION:  ? ?Selected notes from the [place] ?Referred by Dr. Monna Fam for concern of CRVO  ? ?CURRENT MEDICATIONS: ?No current outpatient medications on file. (Ophthalmic Drugs)  ? ?No current facility-administered medications for this visit. (Ophthalmic Drugs)  ? ?Current Outpatient Medications (Other)  ?Medication Sig  ? atorvastatin (LIPITOR) 10 MG tablet Take 10 mg by mouth daily.  ? buPROPion (WELLBUTRIN XL) 300 MG 24 hr tablet Take 300 mg by mouth daily.  ? cyanocobalamin 1000 MCG tablet Take by mouth.  ? donepezil (ARICEPT) 5 MG tablet Take by mouth.  ? doxycycline (ADOXA) 100 MG tablet Take 100 mg by mouth 2 (two) times daily. for 10 days  ? Dulaglutide (TRULICITY) 1.5 HW/2.9HB SOPN Inject into the skin once a week.  ? escitalopram (LEXAPRO) 20 MG tablet Take by mouth.  ? fexofenadine (ALLEGRA) 180 MG tablet Take 180 mg by mouth daily.  ? FLUoxetine (PROZAC) 20 MG tablet daily.  ? glipiZIDE  (GLUCOTROL) 10 MG tablet Take 5 mg by mouth 2 (two) times daily with a meal.  ? hydrOXYzine (ATARAX/VISTARIL) 25 MG tablet Take 25 mg by mouth 2 (two) times daily.  ? insulin degludec (TRESIBA FLEXTOUCH) 100 UNIT/ML SOPN FlexTouch Pen Inject 0.2 mLs (20 Units total) into the skin daily.  ? insulin glargine, 2 Unit Dial, (TOUJEO MAX SOLOSTAR) 300 UNIT/ML Solostar Pen Inject into the skin.  ? Insulin Glargine-Lixisenatide 100-33 UNT-MCG/ML SOPN Inject into the skin.  ? insulin lispro (HUMALOG) 100 UNIT/ML KwikPen Inject into the skin.  ? linagliptin (TRADJENTA) 5 MG TABS tablet Take 1 tablet (5 mg total) by mouth daily.  ? lisinopril (PRINIVIL,ZESTRIL) 2.5 MG tablet Take 2.5 mg by mouth daily.  ? metoCLOPramide (REGLAN) 10 MG tablet Take by mouth.  ? Misc. [facility] Bed Extension piece.  ? pantoprazole (PROTONIX) 40 MG tablet Take by mouth.  ? pioglitazone (ACTOS) 30 MG tablet Take by mouth.  ? blood glucose meter kit and supplies KIT Dispense based on patient and insurance preference. Use up to four times daily as directed. (FOR ICD-10 E11.65).  ? Blood Glucose Monitoring Suppl (ACCU-CHEK GUIDE) w/Device KIT 1 Piece by Does not apply route as directed.  ? cephALEXin (KEFLEX) 500 MG capsule Take 500 mg by mouth 2 (two) times daily. (Patient not  taking: Reported on 06/27/2021)  ? FLUoxetine (PROZAC) 40 MG capsule 1 capsule DAILY (route: oral)  ? glucose blood (ACCU-CHEK GUIDE) test strip Use as instructed  ? glucose blood test strip 1 each by Other route as needed. Use as instructed 4 x daily. E11.65. One touch ultra  ? Insulin Pen Needle (BD PEN NEEDLE NANO U/F) 32G X 4 MM MISC 1 each by Does not apply route at bedtime.  ? lansoprazole (PREVACID) 30 MG capsule Take by mouth.  ? ?No current facility-administered medications for this visit. (Other)  ? ?REVIEW OF SYSTEMS: ?ROS   ?Positive for: Neurological, Genitourinary, Endocrine, Cardiovascular, Eyes ?Negative for: Constitutional, Gastrointestinal, Skin,  Musculoskeletal, HENT, Respiratory, Psychiatric, Allergic/Imm, Heme/Lymph ?Last edited by Leonie Douglas, COA on 08/01/2021  1:49 PM.  ?  ? ?ALLERGIES ?No Known Allergies ? ?PAST MEDICAL HISTORY ?Past Medical History:  ?Diagnosis Date  ? Diabetes ([place])   ? Diabetic retinopathy ([place])   ? NPDR OU  ? Hypertension   ? Hypertensive retinopathy   ? OU  ? ?Past Surgical History:  ?Procedure Laterality Date  ? CATARACT EXTRACTION Bilateral   ? Dr. Herbert Deaner  ? EYE SURGERY Bilateral   ? Cat Sx - Dr. Herbert Deaner  ? ?FAMILY HISTORY ?Family History  ?Problem Relation Age of Onset  ? Diabetes Father   ? Diabetes Sister   ? Diabetes Brother   ? ?SOCIAL HISTORY ?Social History  ? ?Tobacco Use  ? Smoking status: Never  ? Smokeless tobacco: Never  ?Vaping Use  ? Vaping Use: Never used  ?Substance Use Topics  ? Alcohol use: Never  ? Drug use: Never  ?  ? ?  ?OPHTHALMIC EXAM: ?Base Eye Exam   ? ? Visual Acuity (Snellen - Linear)   ? ?   Right Left  ? Dist [place] 20/25 20/25  ? ?  ?  ? ? Tonometry (Tonopen, 1:55 PM)   ? ?   Right Left  ? Pressure 18 17  ? ?  ?  ? ? Pupils   ? ?   Dark Light Shape React APD  ? Right 2 1 Round Minimal None  ? Left 2 1 Round Minimal None  ? ?  ?  ? ? Visual Fields   ?Poor fixation today ? ?  ?  ? ? Extraocular Movement   ? ?   Right Left  ?  Full Full  ? ?  ?  ? ? Neuro/Psych   ? ? Oriented x3: Yes  ? Mood/Affect: Normal  ? ?  ?  ? ? Dilation   ? ? Both eyes: 1.0% Mydriacyl, 2.5% Phenylephrine @ 1:55 PM  ? ?  ?  ? ?  ? ?Slit Lamp and Fundus Exam   ? ? Slit Lamp Exam   ? ?   Right Left  ? Lids/Lashes Dermatochalasis - upper lid Dermatochalasis - upper lid  ? Conjunctiva/Sclera White and quiet White and quiet  ? Cornea 2+ Punctate epithelial erosions, well healed temporal cataract wounds trace Punctate epithelial erosions, mild arcus, well healed temporal cataract wounds  ? Anterior Chamber deep, clear, narrow temporal angle Deep and quiet  ? Iris Round and dilated, No NVI Round and dilated, No NVI  ? Lens PC IOL in good  position PC IOL in good position  ? Anterior Vitreous Vitreous syneresis Vitreous syneresis  ? ?  ?  ? ? Fundus Exam   ? ?   Right Left  ? Disc mild Pallor, Sharp rim, no NVD  Pink and Sharp  ? C/D Ratio 0.2 0.3  ? Macula good foveal reflex, scattered MA/DBH/CWS, +edema - slightly improved, punctate exudates - improving good foveal reflex, scattered IRH -- improved, +cystic changes, +non-central edema - improved, CWS, +punctate exudates - resolved  ? Vessels attenuated, Tortuous attenuated, Tortuous  ? Periphery Attached, scattered IRH/DBH/CWS greatest posteriorly - improved Attached, scattered IRH/DBH/CWS greatest nasal to disc  ? ?  ?  ? ?  ? ?IMAGING AND PROCEDURES  ?Imaging and Procedures for 08/01/2021 ? ?OCT, Retina - OU - Both Eyes   ? ?   ?Right Eye ?Quality was good. Central Foveal Thickness: 294. Progression has improved. Findings include abnormal foveal contour, intraretinal fluid, intraretinal hyper-reflective material, no SRF, vitreomacular adhesion (Mild interval improvement in IRF SN macula and fovea).  ? ?Left Eye ?Quality was good. Central Foveal Thickness: 301. Progression has improved. Findings include intraretinal fluid, no SRF, vitreomacular adhesion , intraretinal hyper-reflective material, normal foveal contour (Mild interval improvement in IRF SN macula and inferior macula ).  ? ?Notes ?*Images captured and stored on drive ? ?Diagnosis / Impression:  ?DME OU ?OD: Mild interval improvement in IRF SN macula and fovea ?OS: Mild interval improvement in IRF SN macula, inferior macula  ? ?Clinical management:  ?See below ? ?Abbreviations: NFP - Normal foveal profile. CME - cystoid macular edema. PED - pigment epithelial detachment. IRF - intraretinal fluid. SRF - subretinal fluid. EZ - ellipsoid zone. ERM - epiretinal membrane. ORA - outer retinal atrophy. ORT - outer retinal tubulation. SRHM - subretinal hyper-reflective material. IRHM - intraretinal hyper-reflective material ? ? ?  ? ?Intravitreal  Injection, Pharmacologic Agent - OD - Right Eye   ? ?   ?Time Out ?08/01/2021. 2:34 PM. Confirmed correct patient, procedure, site, and patient consented.  ? ?Anesthesia ?Topical anesthesia was used. Anesthetic

## 2021-08-01 ENCOUNTER — Ambulatory Visit (INDEPENDENT_AMBULATORY_CARE_PROVIDER_SITE_OTHER): Payer: Medicare Other | Admitting: Ophthalmology

## 2021-08-01 ENCOUNTER — Encounter (INDEPENDENT_AMBULATORY_CARE_PROVIDER_SITE_OTHER): Payer: Self-pay | Admitting: Ophthalmology

## 2021-08-01 DIAGNOSIS — H34831 Tributary (branch) retinal vein occlusion, right eye, with macular edema: Secondary | ICD-10-CM

## 2021-08-01 DIAGNOSIS — E113313 Type 2 diabetes mellitus with moderate nonproliferative diabetic retinopathy with macular edema, bilateral: Secondary | ICD-10-CM

## 2021-08-01 DIAGNOSIS — I10 Essential (primary) hypertension: Secondary | ICD-10-CM

## 2021-08-01 DIAGNOSIS — H35033 Hypertensive retinopathy, bilateral: Secondary | ICD-10-CM | POA: Diagnosis not present

## 2021-08-01 DIAGNOSIS — Z961 Presence of intraocular lens: Secondary | ICD-10-CM

## 2021-08-01 MED ORDER — BEVACIZUMAB CHEMO INJECTION 1.25MG/0.05ML SYRINGE FOR KALEIDOSCOPE
1.2500 mg | INTRAVITREAL | Status: AC | PRN
  Administered 2021-08-01: 1.25 mg via INTRAVITREAL

## 2021-09-03 NOTE — Progress Notes (Signed)
[facility] Note  09/05/2021     CHIEF COMPLAINT Patient presents for Retina Follow Up   HISTORY OF PRESENT ILLNESS: Thomas Finley is a 66 y.o. male who presents to the clinic today for:   HPI     Retina Follow Up   Patient presents with  Diabetic Retinopathy (IVA OU #12 (05.10.23)).  In both eyes.  This started months ago.  Duration of 5 weeks.  Since onset it is stable.  I, the attending physician,  performed the HPI with the patient and updated documentation appropriately.        Comments   Patient feels that the vision is the same. He denies seeing flashes or floaters. His blood sugar was 188 and A1c is 7.2.      Last edited by Bernarda Caffey, MD on 09/07/2021  3:58 PM.     Referring physician: Kennieth Rad, MD [address] [place] [address]  HISTORICAL INFORMATION:   Selected notes from the MEDICAL RECORD NUMBER Referred by Dr. Monna Fam for concern of CRVO   CURRENT MEDICATIONS: No current outpatient medications on file. (Ophthalmic Drugs)   No current facility-administered medications for this visit. (Ophthalmic Drugs)   Current Outpatient Medications (Other)  Medication Sig   atorvastatin (LIPITOR) 10 MG tablet Take 10 mg by mouth daily.   blood glucose meter kit and supplies KIT Dispense based on patient and insurance preference. Use up to four times daily as directed. (FOR ICD-10 E11.65).   Blood Glucose Monitoring Suppl (ACCU-CHEK GUIDE) w/Device KIT 1 Piece by Does not apply route as directed.   buPROPion (WELLBUTRIN XL) 300 MG 24 hr tablet Take 300 mg by mouth daily.   cyanocobalamin 1000 MCG tablet Take by mouth.   donepezil (ARICEPT) 5 MG tablet Take by mouth.   doxycycline (ADOXA) 100 MG tablet Take 100 mg by mouth 2 (two) times daily. for 10 days   Dulaglutide (TRULICITY) 1.5 UY/4.0HK SOPN Inject into the skin once a week.   escitalopram (LEXAPRO) 20 MG tablet Take by mouth.   fexofenadine  (ALLEGRA) 180 MG tablet Take 180 mg by mouth daily.   FLUoxetine (PROZAC) 20 MG tablet daily.   FLUoxetine (PROZAC) 40 MG capsule 1 capsule DAILY (route: oral)   glipiZIDE (GLUCOTROL) 10 MG tablet Take 5 mg by mouth 2 (two) times daily with a meal.   glucose blood (ACCU-CHEK GUIDE) test strip Use as instructed   glucose blood test strip 1 each by Other route as needed. Use as instructed 4 x daily. E11.65. One touch ultra   hydrOXYzine (ATARAX/VISTARIL) 25 MG tablet Take 25 mg by mouth 2 (two) times daily.   insulin degludec (TRESIBA FLEXTOUCH) 100 UNIT/ML SOPN FlexTouch Pen Inject 0.2 mLs (20 Units total) into the skin daily.   insulin glargine, 2 Unit Dial, (TOUJEO MAX SOLOSTAR) 300 UNIT/ML Solostar Pen Inject into the skin.   Insulin Glargine-Lixisenatide 100-33 UNT-MCG/ML SOPN Inject into the skin.   insulin lispro (HUMALOG) 100 UNIT/ML KwikPen Inject into the skin.   Insulin Pen Needle (BD PEN NEEDLE NANO U/F) 32G X 4 MM MISC 1 each by Does not apply route at bedtime.   linagliptin (TRADJENTA) 5 MG TABS tablet Take 1 tablet (5 mg total) by mouth daily.   lisinopril (PRINIVIL,ZESTRIL) 2.5 MG tablet Take 2.5 mg by mouth daily.   metoCLOPramide (REGLAN) 10 MG tablet Take by mouth.   Misc. [facility] Bed Extension piece.   pantoprazole (PROTONIX)  40 MG tablet Take by mouth.   pioglitazone (ACTOS) 30 MG tablet Take by mouth.   cephALEXin (KEFLEX) 500 MG capsule Take 500 mg by mouth 2 (two) times daily. (Patient not taking: Reported on 06/27/2021)   lansoprazole (PREVACID) 30 MG capsule Take by mouth.   No current facility-administered medications for this visit. (Other)   REVIEW OF SYSTEMS: ROS   Positive for: Neurological, Genitourinary, Endocrine, Cardiovascular, Eyes Negative for: Constitutional, Gastrointestinal, Skin, Musculoskeletal, HENT, Respiratory, Psychiatric, Allergic/Imm, Heme/Lymph Last edited by Annie Paras, COT on 09/05/2021  1:38 PM.     ALLERGIES No  Known Allergies  PAST MEDICAL HISTORY Past Medical History:  Diagnosis Date   Diabetes ([place])    Diabetic retinopathy ([place])    NPDR OU   Hypertension    Hypertensive retinopathy    OU   Past Surgical History:  Procedure Laterality Date   CATARACT EXTRACTION Bilateral    Dr. Herbert Deaner   EYE SURGERY Bilateral    Cat Sx - Dr. Herbert Deaner   FAMILY HISTORY Family History  Problem Relation Age of Onset   Diabetes Father    Diabetes Sister    Diabetes Brother    SOCIAL HISTORY Social History   Tobacco Use   Smoking status: Never   Smokeless tobacco: Never  Vaping Use   Vaping Use: Never used  Substance Use Topics   Alcohol use: Never   Drug use: Never       OPHTHALMIC EXAM: Base Eye Exam     Visual Acuity (Snellen - Linear)       Right Left   Dist [place] 20/30 20/30   Dist ph [place] NI NI         Tonometry (Tonopen, 1:43 PM)       Right Left   Pressure 18 19         Pupils       Pupils Dark Light Shape React APD   Right PERRL 2 1 Round Minimal None   Left PERRL 2 1 Round Minimal None         Visual Fields       Left Right    Full Full         Extraocular Movement       Right Left    Full, Ortho Full, Ortho         Neuro/Psych     Oriented x3: Yes   Mood/Affect: Normal         Dilation     Both eyes: 1.0% Mydriacyl, 2.5% Phenylephrine @ 1:40 PM           Slit Lamp and Fundus Exam     Slit Lamp Exam       Right Left   Lids/Lashes Dermatochalasis - upper lid Dermatochalasis - upper lid   Conjunctiva/Sclera White and quiet White and quiet   Cornea 2+ Punctate epithelial erosions, well healed temporal cataract wounds trace Punctate epithelial erosions, mild arcus, well healed temporal cataract wounds   Anterior Chamber deep, clear, narrow temporal angle Deep and quiet   Iris Round and dilated, No NVI Round and dilated, No NVI   Lens PC IOL in good position PC IOL in good position   Anterior Vitreous Vitreous syneresis Vitreous  syneresis         Fundus Exam       Right Left   Disc mild Pallor, Sharp rim, no NVD Pink and Sharp   C/D Ratio 0.2 0.3   Macula good foveal  reflex, scattered MA/DBH/CWS, +edema - slightly improved, punctate exudates - improving good foveal reflex, scattered IRH -- improved, +cystic changes SN fovea and mac -- increased, +CWS, +punctate exudates - resolved   Vessels attenuated, Tortuous attenuated, Tortuous   Periphery Attached, scattered IRH/DBH/CWS greatest posteriorly - improved Attached, scattered IRH/DBH/CWS greatest nasal to disc           IMAGING AND PROCEDURES  Imaging and Procedures for 09/05/2021  OCT, Retina - OU - Both Eyes       Right Eye Quality was good. Central Foveal Thickness: 284. Progression has improved. Findings include no SRF, abnormal foveal contour, intraretinal hyper-reflective material, intraretinal fluid, vitreomacular adhesion (Persistent IRF SN macula and fovea -- slightly improved).   Left Eye Quality was good. Central Foveal Thickness: 344. Progression has worsened. Findings include normal foveal contour, no SRF, intraretinal hyper-reflective material, intraretinal fluid, vitreomacular adhesion (Mild increase in IRF SN macula and inferior macula ).   Notes *Images captured and stored on drive  Diagnosis / Impression:  DME OU OD: Persistent IRF SN macula and fovea -- slightly improved OS: Mild increase in IRF SN macula, inferior macula   Clinical management:  See below  Abbreviations: NFP - Normal foveal profile. CME - cystoid macular edema. PED - pigment epithelial detachment. IRF - intraretinal fluid. SRF - subretinal fluid. EZ - ellipsoid zone. ERM - epiretinal membrane. ORA - outer retinal atrophy. ORT - outer retinal tubulation. SRHM - subretinal hyper-reflective material. IRHM - intraretinal hyper-reflective material      Intravitreal Injection, Pharmacologic Agent - OD - Right Eye       Time Out 09/05/2021. 2:20 PM. Confirmed  correct patient, procedure, site, and patient consented.   Anesthesia Topical anesthesia was used. Anesthetic medications included Lidocaine 2%, Proparacaine 0.5%.   Procedure Preparation included 5% betadine to ocular surface, eyelid speculum. A supplied needle was used.   Injection: 1.25 mg Bevacizumab 1.75m/0.05ml   Route: Intravitreal, Site: Right Eye   NDC: [id number] Lot:: [id number] Expiration date: 11/01/2021   Post-op Post injection exam found visual acuity of at least counting fingers. The patient tolerated the procedure well. There were no complications. The patient received written and verbal post procedure care education. Post injection medications were not given.      Intravitreal Injection, Pharmacologic Agent - OS - Left Eye       Time Out 09/05/2021. 2:20 PM. Confirmed correct patient, procedure, site, and patient consented.   Anesthesia Topical anesthesia was used. Anesthetic medications included Lidocaine 2%, Proparacaine 0.5%.   Procedure Preparation included 5% betadine to ocular surface, eyelid speculum. A (32g) needle was used.   Injection: 2 mg aflibercept 2 MG/0.05ML   Route: Intravitreal, Site: Left Eye   NDC: [id number] Lot: [id number] Expiration date: 06/22/2022, Waste: 0 mL   Post-op Post injection exam found visual acuity of at least counting fingers. The patient tolerated the procedure well. There were no complications. The patient received written and verbal post procedure care education. Post injection medications were not given.            ASSESSMENT/PLAN:    ICD-10-CM   1. Moderate nonproliferative diabetic retinopathy of both eyes with macular edema associated with type 2 diabetes mellitus (HCC)  E11.3313 OCT, Retina - OU - Both Eyes    Intravitreal Injection, Pharmacologic Agent - OD - Right Eye    Intravitreal Injection, Pharmacologic Agent - OS - Left Eye    aflibercept (EYLEA) SOLN 2 mg    Bevacizumab (AVASTIN) SOLN 1.25 mg  2. Branch retinal vein occlusion of right eye with macular edema  H34.8310     3. Essential hypertension  I10     4. Hypertensive retinopathy of both eyes  H35.033     5. Pseudophakia, both eyes  Z96.1      1. Moderate non-proliferative diabetic retinopathy, OU (OD>OS)  - s/p IVA OD #1 (12.31.21)  - s/p IVA OS #1 (01.04.22)  - s/p IVA OU #2 (02.04.22), #3 (03.04.22), #4 (04.01.22),#5 (05.05.22), #6 (07.15.22), #7 (11.09.22), #8 (12.21.22), #9 (02.01.23), #10 (03.01.23), #11 (04.05.23), #12 (05.10.23) - exam shows scattered MA/DBH OU, central edema OU improving - BCVA 20/30 OU from 20/25 OU  - OCT shows OD: Persistent IRF SN macula and fovea --slightly improved; OS: Mild increase in IRF SN macula, inferior macula at 5 wks  - discussed possible IVA resistance OS and switch in medication - recommend IVA OD #13 and switch to IVE OS #1 today, 06.14.23 w/ f/u in 4-5 wks - pt wishes to proceed w/ injections  - RBA of procedure discussed, questions answered  - Avastin informed consent obtained and re-signed on 05.10.23 (OU)  - Eylea informed consent obtained and signed on 06.16.23 (OU) - see procedure note  - f/u in 4-5 weeks, DFE, OCT, possible injections  2. BRVO w/ CME OD  - s/p IVA OD as above - BCVA 20/30 from 20/25 - OCT shows interval increase in IRF/IRHM SN macula and fovea - recommend IVA OD #12 today as above - f/u in 4-5 wks-- DFE/OCT/possible injection  3,4. Hypertensive retinopathy OU - discussed importance of tight BP control - monitor  5. Pseudophakia OU  - s/p CE/IOL OU (Dr. Herbert Deaner)  - IOLs in good position, doing well - monitor  Ophthalmic Meds Ordered this visit:  Meds ordered this encounter  Medications   aflibercept (EYLEA) SOLN 2 mg   Bevacizumab (AVASTIN) SOLN 1.25 mg     Return for Return in 4-5 weeks for DFE, OCT, possible injection.  There are no Patient Instructions on file for this visit.  Explained the diagnoses, plan, and follow up with the  patient and they expressed understanding.  Patient expressed understanding of the importance of proper follow up care.   This document serves as a record of services personally performed by Gardiner Sleeper, MD, PhD. It was created on their behalf by Orvan Falconer, an ophthalmic technician. The creation of this record is the provider's dictation and/or activities during the visit.    Electronically signed by: Orvan Falconer, OA, 09/07/21  4:01 PM  This document serves as a record of services personally performed by Gardiner Sleeper, MD, PhD. It was created on their behalf by Leonie Douglas, an ophthalmic technician. The creation of this record is the provider's dictation and/or activities during the visit.    Electronically signed by: Leonie Douglas COA, 09/07/21  4:01 PM  Gardiner Sleeper, M.D., Ph.D. Diseases & Surgery of the Retina and Vitreous [facility]  I have reviewed the above documentation for accuracy and completeness, and I agree with the above. Gardiner Sleeper, M.D., Ph.D. 09/07/21 4:01 PM   Abbreviations: M myopia (nearsighted); A astigmatism; H hyperopia (farsighted); P presbyopia; Mrx spectacle prescription;  CTL contact lenses; OD right eye; OS left eye; OU both eyes  XT exotropia; ET esotropia; PEK punctate epithelial keratitis; PEE punctate epithelial erosions; DES dry eye syndrome; MGD meibomian gland dysfunction; ATs artificial tears; PFAT's preservative free artificial tears; [place] nuclear sclerotic cataract; PSC posterior  subcapsular cataract; ERM epi-retinal membrane; PVD posterior vitreous detachment; RD retinal detachment; DM diabetes mellitus; DR diabetic retinopathy; NPDR non-proliferative diabetic retinopathy; PDR proliferative diabetic retinopathy; CSME clinically significant macular edema; DME diabetic macular edema; dbh dot blot hemorrhages; CWS cotton wool spot; POAG primary open angle glaucoma; C/D cup-to-disc ratio; HVF humphrey visual field;  GVF goldmann visual field; OCT optical coherence tomography; IOP intraocular pressure; BRVO Branch retinal vein occlusion; CRVO central retinal vein occlusion; CRAO central retinal artery occlusion; BRAO branch retinal artery occlusion; RT retinal tear; SB scleral buckle; PPV pars plana vitrectomy; VH Vitreous hemorrhage; PRP panretinal laser photocoagulation; IVK intravitreal kenalog; VMT vitreomacular traction; MH Macular hole;  NVD neovascularization of the disc; NVE neovascularization elsewhere; AREDS age related eye disease study; ARMD age related macular degeneration; POAG primary open angle glaucoma; EBMD epithelial/anterior basement membrane dystrophy; ACIOL anterior chamber intraocular lens; IOL intraocular lens; PCIOL posterior chamber intraocular lens; Phaco/IOL phacoemulsification with intraocular lens placement; [place] photorefractive keratectomy; LASIK laser assisted in situ keratomileusis; HTN hypertension; DM diabetes mellitus; COPD chronic obstructive pulmonary disease

## 2021-09-05 ENCOUNTER — Encounter (INDEPENDENT_AMBULATORY_CARE_PROVIDER_SITE_OTHER): Payer: Self-pay | Admitting: Ophthalmology

## 2021-09-05 ENCOUNTER — Ambulatory Visit (INDEPENDENT_AMBULATORY_CARE_PROVIDER_SITE_OTHER): Payer: Medicare Other | Admitting: Ophthalmology

## 2021-09-05 DIAGNOSIS — H34831 Tributary (branch) retinal vein occlusion, right eye, with macular edema: Secondary | ICD-10-CM | POA: Diagnosis not present

## 2021-09-05 DIAGNOSIS — E113313 Type 2 diabetes mellitus with moderate nonproliferative diabetic retinopathy with macular edema, bilateral: Secondary | ICD-10-CM | POA: Diagnosis not present

## 2021-09-05 DIAGNOSIS — Z961 Presence of intraocular lens: Secondary | ICD-10-CM

## 2021-09-05 DIAGNOSIS — H35033 Hypertensive retinopathy, bilateral: Secondary | ICD-10-CM

## 2021-09-05 DIAGNOSIS — I10 Essential (primary) hypertension: Secondary | ICD-10-CM | POA: Diagnosis not present

## 2021-09-07 ENCOUNTER — Encounter (INDEPENDENT_AMBULATORY_CARE_PROVIDER_SITE_OTHER): Payer: Self-pay | Admitting: Ophthalmology

## 2021-09-07 MED ORDER — BEVACIZUMAB CHEMO INJECTION 1.25MG/0.05ML SYRINGE FOR KALEIDOSCOPE
1.2500 mg | INTRAVITREAL | Status: AC | PRN
  Administered 2021-09-07: 1.25 mg via INTRAVITREAL

## 2021-09-07 MED ORDER — AFLIBERCEPT 2MG/0.05ML IZ SOLN FOR KALEIDOSCOPE
2.0000 mg | INTRAVITREAL | Status: AC | PRN
  Administered 2021-09-07: 2 mg via INTRAVITREAL

## 2021-09-27 NOTE — Progress Notes (Signed)
[facility] Note  10/03/2021     CHIEF COMPLAINT Patient presents for Retina Follow Up   HISTORY OF PRESENT ILLNESS: Thomas Finley is a 66 y.o. male who presents to the clinic today for:   HPI     Retina Follow Up   Patient presents with  Diabetic Retinopathy (IVA OD #13,  IVE OS #1 (06.14.23)).  In both eyes.  This started months ago.  Duration of 4 weeks.  Since onset it is stable.  I, the attending physician,  performed the HPI with the patient and updated documentation appropriately.        Comments   Patient feels that the vision is the same. He is unsure of his blood sugar and A1C.       Last edited by Bernarda Caffey, MD on 10/04/2021 11:55 PM.    Pt states no change in vision  Referring physician: Kennieth Rad, MD [address] [place] [address]  HISTORICAL INFORMATION:   Selected notes from the MEDICAL RECORD NUMBER Referred by Dr. Monna Fam for concern of CRVO   CURRENT MEDICATIONS: No current outpatient medications on file. (Ophthalmic Drugs)   No current facility-administered medications for this visit. (Ophthalmic Drugs)   Current Outpatient Medications (Other)  Medication Sig   atorvastatin (LIPITOR) 10 MG tablet Take 10 mg by mouth daily.   blood glucose meter kit and supplies KIT Dispense based on patient and insurance preference. Use up to four times daily as directed. (FOR ICD-10 E11.65).   Blood Glucose Monitoring Suppl (ACCU-CHEK GUIDE) w/Device KIT 1 Piece by Does not apply route as directed.   buPROPion (WELLBUTRIN XL) 300 MG 24 hr tablet Take 300 mg by mouth daily.   cephALEXin (KEFLEX) 500 MG capsule Take 500 mg by mouth 2 (two) times daily.   cyanocobalamin 1000 MCG tablet Take by mouth.   donepezil (ARICEPT) 5 MG tablet Take by mouth.   doxycycline (ADOXA) 100 MG tablet Take 100 mg by mouth 2 (two) times daily. for 10 days   Dulaglutide (TRULICITY) 1.5 PY/0.9XI SOPN Inject into the skin once  a week.   escitalopram (LEXAPRO) 20 MG tablet Take by mouth.   fexofenadine (ALLEGRA) 180 MG tablet Take 180 mg by mouth daily.   FLUoxetine (PROZAC) 20 MG tablet daily.   FLUoxetine (PROZAC) 40 MG capsule 1 capsule DAILY (route: oral)   glipiZIDE (GLUCOTROL) 10 MG tablet Take 5 mg by mouth 2 (two) times daily with a meal.   glucose blood (ACCU-CHEK GUIDE) test strip Use as instructed   glucose blood test strip 1 each by Other route as needed. Use as instructed 4 x daily. E11.65. One touch ultra   hydrOXYzine (ATARAX/VISTARIL) 25 MG tablet Take 25 mg by mouth 2 (two) times daily.   insulin degludec (TRESIBA FLEXTOUCH) 100 UNIT/ML SOPN FlexTouch Pen Inject 0.2 mLs (20 Units total) into the skin daily.   insulin glargine, 2 Unit Dial, (TOUJEO MAX SOLOSTAR) 300 UNIT/ML Solostar Pen Inject into the skin.   Insulin Glargine-Lixisenatide 100-33 UNT-MCG/ML SOPN Inject into the skin.   insulin lispro (HUMALOG) 100 UNIT/ML KwikPen Inject into the skin.   Insulin Pen Needle (BD PEN NEEDLE NANO U/F) 32G X 4 MM MISC 1 each by Does not apply route at bedtime.   linagliptin (TRADJENTA) 5 MG TABS tablet Take 1 tablet (5 mg total) by mouth daily.   lisinopril (PRINIVIL,ZESTRIL) 2.5 MG tablet Take 2.5 mg by mouth daily.   metoCLOPramide (  REGLAN) 10 MG tablet Take by mouth.   Misc. [facility] Bed Extension piece.   pantoprazole (PROTONIX) 40 MG tablet Take by mouth.   pioglitazone (ACTOS) 30 MG tablet Take by mouth.   lansoprazole (PREVACID) 30 MG capsule Take by mouth.   No current facility-administered medications for this visit. (Other)   REVIEW OF SYSTEMS: ROS   Positive for: Neurological, Genitourinary, Endocrine, Cardiovascular, Eyes Negative for: Constitutional, Gastrointestinal, Skin, Musculoskeletal, HENT, Respiratory, Psychiatric, Allergic/Imm, Heme/Lymph Last edited by Annie Paras, COT on 10/03/2021  1:44 PM.     ALLERGIES No Known Allergies  PAST MEDICAL HISTORY Past  Medical History:  Diagnosis Date   Diabetes ([place])    Diabetic retinopathy ([place])    NPDR OU   Hypertension    Hypertensive retinopathy    OU   Past Surgical History:  Procedure Laterality Date   CATARACT EXTRACTION Bilateral    Dr. Herbert Deaner   EYE SURGERY Bilateral    Cat Sx - Dr. Herbert Deaner   FAMILY HISTORY Family History  Problem Relation Age of Onset   Diabetes Father    Diabetes Sister    Diabetes Brother    SOCIAL HISTORY Social History   Tobacco Use   Smoking status: Never   Smokeless tobacco: Never  Vaping Use   Vaping Use: Never used  Substance Use Topics   Alcohol use: Never   Drug use: Never       OPHTHALMIC EXAM: Base Eye Exam     Visual Acuity (Snellen - Linear)       Right Left   Dist [place] 20/30 +2 20/30   Dist ph [place] NI NI         Tonometry (Tonopen, 1:46 PM)       Right Left   Pressure 19 19         Pupils       Dark Light Shape React APD   Right 2 1 Round Minimal None   Left 2 1 Round Minimal None         Visual Fields       Left Right    Full Full         Extraocular Movement       Right Left    Full, Ortho Full, Ortho         Neuro/Psych     Oriented x3: Yes   Mood/Affect: Normal         Dilation     Both eyes: 1.0% Mydriacyl, 2.5% Phenylephrine @ 1:45 PM           Slit Lamp and Fundus Exam     Slit Lamp Exam       Right Left   Lids/Lashes Dermatochalasis - upper lid Dermatochalasis - upper lid   Conjunctiva/Sclera White and quiet White and quiet   Cornea 2+ Punctate epithelial erosions, well healed temporal cataract wounds trace Punctate epithelial erosions, mild arcus, well healed temporal cataract wounds   Anterior Chamber deep, clear, narrow temporal angle Deep and quiet   Iris Round and dilated, No NVI Round and dilated, No NVI   Lens PC IOL in good position PC IOL in good position   Anterior Vitreous Vitreous syneresis Vitreous syneresis         Fundus Exam       Right Left   Disc mild  Pallor, Sharp rim, no NVD Pink and Sharp   C/D Ratio 0.2 0.3   Macula good foveal reflex, scattered MA/DBH/CWS, +edema -  slightly improved, punctate exudates - improving good foveal reflex, scattered IRH -- improved, +cystic changes SN fovea and mac -- improved, +CWS, +punctate exudates - stably resolved   Vessels attenuated, Tortuous attenuated, Tortuous   Periphery Attached, scattered IRH/DBH/CWS greatest posteriorly - improved Attached, scattered IRH/DBH/CWS greatest nasal to disc           IMAGING AND PROCEDURES  Imaging and Procedures for 10/03/2021  OCT, Retina - OU - Both Eyes       Right Eye Quality was good. Central Foveal Thickness: 274. Progression has improved. Findings include no SRF, abnormal foveal contour, intraretinal hyper-reflective material, intraretinal fluid, vitreomacular adhesion (Interval improvement in IRF SN macula and fovea ).   Left Eye Quality was good. Central Foveal Thickness: 314. Progression has improved. Findings include normal foveal contour, no SRF, intraretinal hyper-reflective material, intraretinal fluid, vitreomacular adhesion (Interval improvement in IRF SN macula and inferior macula ).   Notes *Images captured and stored on drive  Diagnosis / Impression:  DME OU OD: Interval improvement in IRF SN macula and fovea  OS: Interval improvement in IRF SN macula and inferior macula   Clinical management:  See below  Abbreviations: NFP - Normal foveal profile. CME - cystoid macular edema. PED - pigment epithelial detachment. IRF - intraretinal fluid. SRF - subretinal fluid. EZ - ellipsoid zone. ERM - epiretinal membrane. ORA - outer retinal atrophy. ORT - outer retinal tubulation. SRHM - subretinal hyper-reflective material. IRHM - intraretinal hyper-reflective material      Intravitreal Injection, Pharmacologic Agent - OD - Right Eye       Time Out 10/03/2021. 2:22 PM. Confirmed correct patient, procedure, site, and patient consented.    Anesthesia Topical anesthesia was used. Anesthetic medications included Lidocaine 2%, Proparacaine 0.5%.   Procedure Preparation included 5% betadine to ocular surface, eyelid speculum. A (32g) needle was used.   Injection: 2 mg aflibercept 2 MG/0.05ML   Route: Intravitreal, Site: Right Eye   NDC: A3590391, Lot: [id number], Expiration date: 07/23/2022, Waste: 0 mL   Post-op Post injection exam found visual acuity of at least counting fingers. The patient tolerated the procedure well. There were no complications. The patient received written and verbal post procedure care education. Post injection medications were not given.      Intravitreal Injection, Pharmacologic Agent - OS - Left Eye       Time Out 10/03/2021. 2:37 PM. Confirmed correct patient, procedure, site, and patient consented.   Anesthesia Topical anesthesia was used. Anesthetic medications included Lidocaine 2%, Proparacaine 0.5%.   Procedure Preparation included 5% betadine to ocular surface, eyelid speculum. A (32g) needle was used.   Injection: 2 mg aflibercept 2 MG/0.05ML   Route: Intravitreal, Site: Left Eye   NDC: M7179715, Lot: [id number], Expiration date: 04/24/2022, Waste: 0 mL   Post-op Post injection exam found visual acuity of at least counting fingers. The patient tolerated the procedure well. There were no complications. The patient received written and verbal post procedure care education. Post injection medications were not given.            ASSESSMENT/PLAN:    ICD-10-CM   1. 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 Branch retinal vein occlusion of right eye with  macular edema  H34.8310     3. Essential hypertension  I10     4. Hypertensive retinopathy of both eyes  H35.033     5. Pseudophakia, both eyes  Z96.1      1. Moderate non-proliferative diabetic retinopathy, OU (OD>OS)  - s/p IVA OD #1 (12.31.21), #2 (02.04.22), #3 (03.04.22), #4 (04.01.22), #5 (05.05.22), #6 (07.15.22), #7 (11.09.22), #8 (12.21.22), #9 (02.01.23), #10 (03.01.23), #11 (04.05.23), #12 (05.10.23), #13 (06.14.23)  - s/p IVA OS #1 (01.04.22)  - s/p IVA OU #2 (02.04.22), #3 (03.04.22), #4 (04.01.22),#5 (05.05.22), #6 (07.15.22), #7 (11.09.22), #8 (12.21.22), #9 (02.01.23), #10 (03.01.23), #11 (04.05.23), #12 (05.10.23)  - s/p IVE OS #1 (06.14.23) - exam shows scattered MA/DBH OU, central edema OU improving - BCVA stable at 20/30 OU   - OCT shows OD: Interval improvement in IRF SN macula and fovea; OS: Interval improvement in IRF SN macula and inferior macula  - discussed possible IVA resistance and switch in medication - recommend IVE OD #1 and IVE OS #2 today, 07.12.23 w/ f/u in 4-5 wks - pt wishes to proceed w/ injections  - RBA of procedure discussed, questions answered  - Avastin informed consent obtained and re-signed on 05.10.23 (OU)  - Eylea informed consent obtained and signed on 06.16.23 (OU) - see procedure note  - f/u in 4-5 weeks, DFE, OCT, possible injections  2. BRVO w/ CME OD  - s/p IVA OD x13 as above - BCVA 20/30 from 20/25 - OCT shows interval increase in IRF/IRHM SN macula and fovea - recommend IVE OD #1 today as above - f/u in 4-5 wks-- DFE/OCT/possible injection  3,4. Hypertensive retinopathy OU - discussed importance of tight BP control - monitor  5. Pseudophakia OU  - s/p CE/IOL OU (Dr. Herbert Deaner)  - IOLs in good position, doing well - monitor  Ophthalmic Meds Ordered this visit:  Meds ordered this encounter  Medications   aflibercept (EYLEA) SOLN 2 mg   aflibercept (EYLEA) SOLN 2 mg     Return in about 4 weeks (around 10/31/2021) for f/u  NPDR OU, DFE, OCT.  There are no Patient Instructions on file for this visit.  Explained the diagnoses, plan, and follow up with the patient and they expressed understanding.  Patient expressed understanding of the importance of proper follow up care.   This document serves as a record of services personally performed by Gardiner Sleeper, MD, PhD. It was created on their behalf by Orvan Falconer, an ophthalmic technician. The creation of this record is the provider's dictation and/or activities during the visit.    Electronically signed by: Orvan Falconer, OA, 10/04/21  11:58 PM  This document serves as a record of services personally performed by Gardiner Sleeper, MD, PhD. It was created on their behalf by San Jetty. Owens Shark, OA an ophthalmic technician. The creation of this record is the provider's dictation and/or activities during the visit.    Electronically signed by: San Jetty. Marguerita Merles 07.12.2023 11:58 PM  Gardiner Sleeper, M.D., Ph.D. Diseases & Surgery of the Retina and Vitreous [facility]  I have reviewed the above documentation for accuracy and completeness, and I agree with the above. Gardiner Sleeper, M.D., Ph.D. 10/05/21 12:01 AM   Abbreviations: M myopia (nearsighted); A astigmatism; H hyperopia (farsighted); P presbyopia; Mrx spectacle prescription;  CTL contact lenses; OD right eye; OS left eye; OU both  eyes  XT exotropia; ET esotropia; PEK punctate epithelial keratitis; PEE punctate epithelial erosions; DES dry eye syndrome; MGD meibomian gland dysfunction; ATs artificial tears; PFAT's preservative free artificial tears; [place] nuclear sclerotic cataract; PSC posterior subcapsular cataract; ERM epi-retinal membrane; PVD posterior vitreous detachment; RD retinal detachment; DM diabetes mellitus; DR diabetic retinopathy; NPDR non-proliferative diabetic retinopathy; PDR proliferative diabetic retinopathy; CSME clinically significant macular edema; DME diabetic  macular edema; dbh dot blot hemorrhages; CWS cotton wool spot; POAG primary open angle glaucoma; C/D cup-to-disc ratio; HVF humphrey visual field; GVF goldmann visual field; OCT optical coherence tomography; IOP intraocular pressure; BRVO Branch retinal vein occlusion; CRVO central retinal vein occlusion; CRAO central retinal artery occlusion; BRAO branch retinal artery occlusion; RT retinal tear; SB scleral buckle; PPV pars plana vitrectomy; VH Vitreous hemorrhage; PRP panretinal laser photocoagulation; IVK intravitreal kenalog; VMT vitreomacular traction; MH Macular hole;  NVD neovascularization of the disc; NVE neovascularization elsewhere; AREDS age related eye disease study; ARMD age related macular degeneration; POAG primary open angle glaucoma; EBMD epithelial/anterior basement membrane dystrophy; ACIOL anterior chamber intraocular lens; IOL intraocular lens; PCIOL posterior chamber intraocular lens; Phaco/IOL phacoemulsification with intraocular lens placement; [place] photorefractive keratectomy; LASIK laser assisted in situ keratomileusis; HTN hypertension; DM diabetes mellitus; COPD chronic obstructive pulmonary disease

## 2021-10-03 ENCOUNTER — Encounter (INDEPENDENT_AMBULATORY_CARE_PROVIDER_SITE_OTHER): Payer: Self-pay | Admitting: Ophthalmology

## 2021-10-03 ENCOUNTER — Ambulatory Visit (INDEPENDENT_AMBULATORY_CARE_PROVIDER_SITE_OTHER): Payer: Medicare Other | Admitting: Ophthalmology

## 2021-10-03 DIAGNOSIS — H34831 Tributary (branch) retinal vein occlusion, right eye, with macular edema: Secondary | ICD-10-CM | POA: Diagnosis not present

## 2021-10-03 DIAGNOSIS — I10 Essential (primary) hypertension: Secondary | ICD-10-CM

## 2021-10-03 DIAGNOSIS — H35033 Hypertensive retinopathy, bilateral: Secondary | ICD-10-CM

## 2021-10-03 DIAGNOSIS — Z961 Presence of intraocular lens: Secondary | ICD-10-CM

## 2021-10-03 DIAGNOSIS — E113313 Type 2 diabetes mellitus with moderate nonproliferative diabetic retinopathy with macular edema, bilateral: Secondary | ICD-10-CM | POA: Diagnosis not present

## 2021-10-04 ENCOUNTER — Encounter (INDEPENDENT_AMBULATORY_CARE_PROVIDER_SITE_OTHER): Payer: Self-pay | Admitting: Ophthalmology

## 2021-10-04 MED ORDER — AFLIBERCEPT 2MG/0.05ML IZ SOLN FOR KALEIDOSCOPE
2.0000 mg | INTRAVITREAL | Status: AC | PRN
  Administered 2021-10-04: 2 mg via INTRAVITREAL

## 2021-10-25 NOTE — Progress Notes (Signed)
[facility] Note  10/31/2021     CHIEF COMPLAINT Patient presents for Retina Follow Up   HISTORY OF PRESENT ILLNESS: Thomas Finley is a 66 y.o. male who presents to the clinic today for:   HPI     Retina Follow Up   Patient presents with  Diabetic Retinopathy.  In both eyes.  This started 4 weeks ago.  I, the attending physician,  performed the HPI with the patient and updated documentation appropriately.        Comments   Patient here for 4 weeks retina follow up for NPDR OU. Patient states vision doing fine. No eye pain. Started antibiotic last week. On for 7 days today the last day for place on foot.       Last edited by Bernarda Caffey, MD on 10/31/2021  3:52 PM.    Pt states no change in vision  Referring physician: Kennieth Rad, MD [address] [place] [address]  HISTORICAL INFORMATION:   Selected notes from the MEDICAL RECORD NUMBER Referred by Dr. Monna Fam for concern of CRVO   CURRENT MEDICATIONS: No current outpatient medications on file. (Ophthalmic Drugs)   No current facility-administered medications for this visit. (Ophthalmic Drugs)   Current Outpatient Medications (Other)  Medication Sig   atorvastatin (LIPITOR) 10 MG tablet Take 10 mg by mouth daily.   blood glucose meter kit and supplies KIT Dispense based on patient and insurance preference. Use up to four times daily as directed. (FOR ICD-10 E11.65).   Blood Glucose Monitoring Suppl (ACCU-CHEK GUIDE) w/Device KIT 1 Piece by Does not apply route as directed.   buPROPion (WELLBUTRIN XL) 300 MG 24 hr tablet Take 300 mg by mouth daily.   cephALEXin (KEFLEX) 500 MG capsule Take 500 mg by mouth 2 (two) times daily.   cyanocobalamin 1000 MCG tablet Take by mouth.   donepezil (ARICEPT) 5 MG tablet Take by mouth.   doxycycline (ADOXA) 100 MG tablet Take 100 mg by mouth 2 (two) times daily. for 10 days   Dulaglutide (TRULICITY) 1.5 LK/4.4WN SOPN Inject into  the skin once a week.   escitalopram (LEXAPRO) 20 MG tablet Take by mouth.   fexofenadine (ALLEGRA) 180 MG tablet Take 180 mg by mouth daily.   FLUoxetine (PROZAC) 20 MG tablet daily.   FLUoxetine (PROZAC) 40 MG capsule 1 capsule DAILY (route: oral)   glipiZIDE (GLUCOTROL) 10 MG tablet Take 5 mg by mouth 2 (two) times daily with a meal.   glucose blood (ACCU-CHEK GUIDE) test strip Use as instructed   glucose blood test strip 1 each by Other route as needed. Use as instructed 4 x daily. E11.65. One touch ultra   hydrOXYzine (ATARAX/VISTARIL) 25 MG tablet Take 25 mg by mouth 2 (two) times daily.   insulin degludec (TRESIBA FLEXTOUCH) 100 UNIT/ML SOPN FlexTouch Pen Inject 0.2 mLs (20 Units total) into the skin daily.   insulin glargine, 2 Unit Dial, (TOUJEO MAX SOLOSTAR) 300 UNIT/ML Solostar Pen Inject into the skin.   Insulin Glargine-Lixisenatide 100-33 UNT-MCG/ML SOPN Inject into the skin.   insulin lispro (HUMALOG) 100 UNIT/ML KwikPen Inject into the skin.   Insulin Pen Needle (BD PEN NEEDLE NANO U/F) 32G X 4 MM MISC 1 each by Does not apply route at bedtime.   linagliptin (TRADJENTA) 5 MG TABS tablet Take 1 tablet (5 mg total) by mouth daily.   lisinopril (PRINIVIL,ZESTRIL) 2.5 MG tablet Take 2.5 mg by mouth daily.  metoCLOPramide (REGLAN) 10 MG tablet Take by mouth.   Misc. [facility] Bed Extension piece.   pantoprazole (PROTONIX) 40 MG tablet Take by mouth.   pioglitazone (ACTOS) 30 MG tablet Take by mouth.   lansoprazole (PREVACID) 30 MG capsule Take by mouth.   No current facility-administered medications for this visit. (Other)   REVIEW OF SYSTEMS: ROS   Positive for: Neurological, Genitourinary, Endocrine, Cardiovascular, Eyes Negative for: Constitutional, Gastrointestinal, Skin, Musculoskeletal, HENT, Respiratory, Psychiatric, Allergic/Imm, Heme/Lymph Last edited by Theodore Demark, COA on 10/31/2021  1:45 PM.     ALLERGIES No Known Allergies  PAST MEDICAL  HISTORY Past Medical History:  Diagnosis Date   Diabetes ([place])    Diabetic retinopathy ([place])    NPDR OU   Hypertension    Hypertensive retinopathy    OU   Past Surgical History:  Procedure Laterality Date   CATARACT EXTRACTION Bilateral    Dr. Herbert Deaner   EYE SURGERY Bilateral    Cat Sx - Dr. Herbert Deaner   FAMILY HISTORY Family History  Problem Relation Age of Onset   Diabetes Father    Diabetes Sister    Diabetes Brother    SOCIAL HISTORY Social History   Tobacco Use   Smoking status: Never   Smokeless tobacco: Never  Vaping Use   Vaping Use: Never used  Substance Use Topics   Alcohol use: Never   Drug use: Never       OPHTHALMIC EXAM: Base Eye Exam     Visual Acuity (Snellen - Linear)       Right Left   Dist [place] 20/25 -2 20/25   Dist ph [place] NI NI         Tonometry (Tonopen, 1:43 PM)       Right Left   Pressure 14 09         Pupils       Dark Light Shape React APD   Right 2 1 Round Minimal None   Left 2 1 Round Minimal None         Visual Fields (Counting fingers)       Left Right    Full Full         Extraocular Movement       Right Left    Full, Ortho Full, Ortho         Neuro/Psych     Oriented x3: Yes   Mood/Affect: Normal         Dilation     Both eyes: 1.0% Mydriacyl, 2.5% Phenylephrine @ 1:43 PM           Slit Lamp and Fundus Exam     Slit Lamp Exam       Right Left   Lids/Lashes Dermatochalasis - upper lid Dermatochalasis - upper lid   Conjunctiva/Sclera White and quiet White and quiet   Cornea 2+ Punctate epithelial erosions, well healed temporal cataract wounds trace Punctate epithelial erosions, mild arcus, well healed temporal cataract wounds   Anterior Chamber deep, clear, narrow temporal angle Deep and quiet   Iris Round and dilated, No NVI Round and dilated, No NVI   Lens PC IOL in good position PC IOL in good position   Anterior Vitreous Vitreous syneresis Vitreous syneresis         Fundus Exam        Right Left   Disc mild Pallor, Sharp rim, no NVD Pink and Sharp   C/D Ratio 0.2 0.3   Macula good foveal reflex,  scattered MA/DBH/CWS -- improved, +edema - slightly improved, punctate exudates - improving good foveal reflex, scattered IRH -- improved, +cystic changes SN fovea and mac -- improved, +CWS, +punctate exudates - stably resolved   Vessels attenuated, Tortuous attenuated, Tortuous   Periphery Attached, scattered IRH/DBH/CWS greatest posteriorly - improved Attached, scattered IRH/DBH/CWS greatest nasal to disc           IMAGING AND PROCEDURES  Imaging and Procedures for 10/31/2021  OCT, Retina - OU - Both Eyes       Right Eye Quality was good. Central Foveal Thickness: 262. Progression has improved. Findings include no SRF, abnormal foveal contour, intraretinal hyper-reflective material, intraretinal fluid, vitreomacular adhesion (Interval improvement in IRF ).   Left Eye Quality was good. Central Foveal Thickness: 303. Progression has improved. Findings include normal foveal contour, no SRF, intraretinal hyper-reflective material, intraretinal fluid, vitreomacular adhesion (Interval improvement in IRF greatest SN macula and inferior macula ).   Notes *Images captured and stored on drive  Diagnosis / Impression:  DME OU OD: Interval improvement in IRF  OS: Interval improvement in IRF greatest SN macula and inferior macula   Clinical management:  See below  Abbreviations: NFP - Normal foveal profile. CME - cystoid macular edema. PED - pigment epithelial detachment. IRF - intraretinal fluid. SRF - subretinal fluid. EZ - ellipsoid zone. ERM - epiretinal membrane. ORA - outer retinal atrophy. ORT - outer retinal tubulation. SRHM - subretinal hyper-reflective material. IRHM - intraretinal hyper-reflective material      Intravitreal Injection, Pharmacologic Agent - OD - Right Eye       Time Out 10/31/2021. 2:34 PM. Confirmed correct patient, procedure, site, and  patient consented.   Anesthesia Topical anesthesia was used. Anesthetic medications included Lidocaine 2%, Proparacaine 0.5%.   Procedure Preparation included 5% betadine to ocular surface, eyelid speculum. A (32g) needle was used.   Injection: 2 mg aflibercept 2 MG/0.05ML   Route: Intravitreal, Site: Right Eye   NDC: A3590391, Lot: [id number], Expiration date: 07/17/2022, Waste: 0 mL   Post-op Post injection exam found visual acuity of at least counting fingers. The patient tolerated the procedure well. There were no complications. The patient received written and verbal post procedure care education. Post injection medications were not given.      Intravitreal Injection, Pharmacologic Agent - OS - Left Eye       Time Out 10/31/2021. 2:35 PM. Confirmed correct patient, procedure, site, and patient consented.   Anesthesia Topical anesthesia was used. Anesthetic medications included Lidocaine 2%, Proparacaine 0.5%.   Procedure Preparation included 5% betadine to ocular surface, eyelid speculum. A (32g) needle was used.   Injection: 2 mg aflibercept 2 MG/0.05ML   Route: Intravitreal, Site: Left Eye   NDC: A3590391, Lot: [id number], Expiration date: 07/17/2022, Waste: 0 mL   Post-op Post injection exam found visual acuity of at least counting fingers. The patient tolerated the procedure well. There were no complications. The patient received written and verbal post procedure care education. Post injection medications were not given.            ASSESSMENT/PLAN:    ICD-10-CM   1. 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 Branch retinal vein occlusion  of right eye with macular edema  H34.8310     3. Essential hypertension  I10     4. Hypertensive retinopathy of both eyes  H35.033     5. Pseudophakia, both eyes  Z96.1       1. Moderate non-proliferative diabetic retinopathy, OU (OD>OS)  - last A1c was 8.7 on 08.03.23  - s/p IVA OD #1 (12.31.21), #2 (02.04.22), #3 (03.04.22), #4 (04.01.22), #5 (05.05.22), #6 (07.15.22), #7 (11.09.22), #8 (12.21.22), #9 (02.01.23), #10 (03.01.23), #11 (04.05.23), #12 (05.10.23), #13 (06.14.23)  - s/p IVA OS #1 (01.04.22)  - s/p IVA OU #2 (02.04.22), #3 (03.04.22), #4 (04.01.22),#5 (05.05.22), #6 (07.15.22), #7 (11.09.22), #8 (12.21.22), #9 (02.01.23), #10 (03.01.23), #11 (04.05.23), #12 (05.10.23)  - s/p IVE OS #1 (06.14.23), #2 (07.12.23)  - s/p IVE OD #1 (07.12.23) - exam shows scattered MA/DBH OU, central edema OU improving - BCVA stable at 20/25 OU -- improved  - OCT shows OD: Interval improvement in IRF; OS: Interval improvement in IRF greatest SN macula and inferior macula  - discussed possible IVA resistance and switch in medication - recommend IVE OD #2 and IVE OS #3 today, 08.09.23 w/ f/u in 4 wks - pt wishes to proceed w/ injections  - RBA of procedure discussed, questions answered  - Avastin informed consent obtained and re-signed on 05.10.23 (OU)  - Eylea informed consent obtained and signed on 06.16.23 (OU) - see procedure note - f/u in 4-5 weeks, DFE, OCT, possible injections  2. BRVO w/ CME OD  - s/p IVA OD x13 and IVE as above - BCVA 20/30 from 20/25 - OCT shows interval increase in IRF/IRHM SN macula and fovea - recommend IVE OD #2 today as above - f/u in 4-5 wks-- DFE/OCT/possible injection  3,4. Hypertensive retinopathy OU - discussed importance of tight BP control - monitor  5. Pseudophakia OU  - s/p CE/IOL OU (Dr. Herbert Deaner)  - IOLs in good position, doing well - monitor  Ophthalmic Meds Ordered this visit:  Meds ordered this encounter  Medications   aflibercept  (EYLEA) SOLN 2 mg   aflibercept (EYLEA) SOLN 2 mg     Return for f/u 4-5 weeks, NPDR OU, DFE, OCT.  There are no Patient Instructions on file for this visit.  Explained the diagnoses, plan, and follow up with the patient and they expressed understanding.  Patient expressed understanding of the importance of proper follow up care.   This document serves as a record of services personally performed by Gardiner Sleeper, MD, PhD. It was created on their behalf by Orvan Falconer, an ophthalmic technician. The creation of this record is the provider's dictation and/or activities during the visit.    Electronically signed by: Orvan Falconer, OA, 10/31/21  3:54 PM  Gardiner Sleeper, M.D., Ph.D. Diseases & Surgery of the Retina and Vitreous [facility]  I have reviewed the above documentation for accuracy and completeness, and I agree with the above. Gardiner Sleeper, M.D., Ph.D. 10/31/21 3:55 PM   Abbreviations: M myopia (nearsighted); A astigmatism; H hyperopia (farsighted); P presbyopia; Mrx spectacle prescription;  CTL contact lenses; OD right eye; OS left eye; OU both eyes  XT exotropia; ET esotropia; PEK punctate epithelial keratitis; PEE punctate epithelial erosions; DES dry eye syndrome; MGD meibomian gland dysfunction; ATs artificial tears; PFAT's preservative free artificial tears; [place] nuclear sclerotic cataract; PSC posterior subcapsular cataract; ERM epi-retinal membrane; PVD posterior vitreous detachment;  RD retinal detachment; DM diabetes mellitus; DR diabetic retinopathy; NPDR non-proliferative diabetic retinopathy; PDR proliferative diabetic retinopathy; CSME clinically significant macular edema; DME diabetic macular edema; dbh dot blot hemorrhages; CWS cotton wool spot; POAG primary open angle glaucoma; C/D cup-to-disc ratio; HVF humphrey visual field; GVF goldmann visual field; OCT optical coherence tomography; IOP intraocular pressure; BRVO Branch retinal vein  occlusion; CRVO central retinal vein occlusion; CRAO central retinal artery occlusion; BRAO branch retinal artery occlusion; RT retinal tear; SB scleral buckle; PPV pars plana vitrectomy; VH Vitreous hemorrhage; PRP panretinal laser photocoagulation; IVK intravitreal kenalog; VMT vitreomacular traction; MH Macular hole;  NVD neovascularization of the disc; NVE neovascularization elsewhere; AREDS age related eye disease study; ARMD age related macular degeneration; POAG primary open angle glaucoma; EBMD epithelial/anterior basement membrane dystrophy; ACIOL anterior chamber intraocular lens; IOL intraocular lens; PCIOL posterior chamber intraocular lens; Phaco/IOL phacoemulsification with intraocular lens placement; [place] photorefractive keratectomy; LASIK laser assisted in situ keratomileusis; HTN hypertension; DM diabetes mellitus; COPD chronic obstructive pulmonary disease

## 2021-10-31 ENCOUNTER — Ambulatory Visit (INDEPENDENT_AMBULATORY_CARE_PROVIDER_SITE_OTHER): Payer: Medicare Other | Admitting: Ophthalmology

## 2021-10-31 ENCOUNTER — Encounter (INDEPENDENT_AMBULATORY_CARE_PROVIDER_SITE_OTHER): Payer: Self-pay | Admitting: Ophthalmology

## 2021-10-31 DIAGNOSIS — Z961 Presence of intraocular lens: Secondary | ICD-10-CM

## 2021-10-31 DIAGNOSIS — I10 Essential (primary) hypertension: Secondary | ICD-10-CM

## 2021-10-31 DIAGNOSIS — H35033 Hypertensive retinopathy, bilateral: Secondary | ICD-10-CM | POA: Diagnosis not present

## 2021-10-31 DIAGNOSIS — H34831 Tributary (branch) retinal vein occlusion, right eye, with macular edema: Secondary | ICD-10-CM

## 2021-10-31 DIAGNOSIS — E113313 Type 2 diabetes mellitus with moderate nonproliferative diabetic retinopathy with macular edema, bilateral: Secondary | ICD-10-CM | POA: Diagnosis not present

## 2021-10-31 MED ORDER — AFLIBERCEPT 2MG/0.05ML IZ SOLN FOR KALEIDOSCOPE
2.0000 mg | INTRAVITREAL | Status: AC | PRN
  Administered 2021-10-31: 2 mg via INTRAVITREAL

## 2021-11-22 NOTE — Progress Notes (Signed)
[facility] Note  12/04/2021     CHIEF COMPLAINT Patient presents for Retina Follow Up   HISTORY OF PRESENT ILLNESS: Thomas Finley is a 66 y.o. male who presents to the clinic today for:   HPI     Retina Follow Up   Patient presents with  Diabetic Retinopathy.  In both eyes.  Severity is mild.  Duration of 4 weeks.  Since onset it is stable.  I, the attending physician,  performed the HPI with the patient and updated documentation appropriately.        Comments   4 week Retina eval for NPDR ou. No changes in vision. PATIENT BLOOD SUGAR IN 200      Last edited by Bernarda Caffey, MD on 12/04/2021  3:47 PM.    Pt states no change in vision  Referring physician: Kennieth Rad, MD [address] [place] [address]  HISTORICAL INFORMATION:   Selected notes from the MEDICAL RECORD NUMBER Referred by Dr. Monna Fam for concern of CRVO   CURRENT MEDICATIONS: No current outpatient medications on file. (Ophthalmic Drugs)   No current facility-administered medications for this visit. (Ophthalmic Drugs)   Current Outpatient Medications (Other)  Medication Sig   atorvastatin (LIPITOR) 10 MG tablet Take 10 mg by mouth daily.   blood glucose meter kit and supplies KIT Dispense based on patient and insurance preference. Use up to four times daily as directed. (FOR ICD-10 E11.65).   Blood Glucose Monitoring Suppl (ACCU-CHEK GUIDE) w/Device KIT 1 Piece by Does not apply route as directed.   buPROPion (WELLBUTRIN XL) 300 MG 24 hr tablet Take 300 mg by mouth daily.   cephALEXin (KEFLEX) 500 MG capsule Take 500 mg by mouth 2 (two) times daily.   cyanocobalamin 1000 MCG tablet Take by mouth.   donepezil (ARICEPT) 5 MG tablet Take by mouth.   doxycycline (ADOXA) 100 MG tablet Take 100 mg by mouth 2 (two) times daily. for 10 days   Dulaglutide (TRULICITY) 1.5 WE/9.9BZ SOPN Inject into the skin once a week.   escitalopram (LEXAPRO) 20 MG tablet  Take by mouth.   fexofenadine (ALLEGRA) 180 MG tablet Take 180 mg by mouth daily.   FLUoxetine (PROZAC) 20 MG tablet daily.   FLUoxetine (PROZAC) 40 MG capsule 1 capsule DAILY (route: oral)   glipiZIDE (GLUCOTROL) 10 MG tablet Take 5 mg by mouth 2 (two) times daily with a meal.   glucose blood (ACCU-CHEK GUIDE) test strip Use as instructed   hydrOXYzine (ATARAX/VISTARIL) 25 MG tablet Take 25 mg by mouth 2 (two) times daily.   insulin degludec (TRESIBA FLEXTOUCH) 100 UNIT/ML SOPN FlexTouch Pen Inject 0.2 mLs (20 Units total) into the skin daily.   insulin glargine, 2 Unit Dial, (TOUJEO MAX SOLOSTAR) 300 UNIT/ML Solostar Pen Inject into the skin.   Insulin Glargine-Lixisenatide 100-33 UNT-MCG/ML SOPN Inject into the skin.   insulin lispro (HUMALOG) 100 UNIT/ML KwikPen Inject into the skin.   Insulin Pen Needle (BD PEN NEEDLE NANO U/F) 32G X 4 MM MISC 1 each by Does not apply route at bedtime.   linagliptin (TRADJENTA) 5 MG TABS tablet Take 1 tablet (5 mg total) by mouth daily.   lisinopril (PRINIVIL,ZESTRIL) 2.5 MG tablet Take 2.5 mg by mouth daily.   metoCLOPramide (REGLAN) 10 MG tablet Take by mouth.   Misc. [facility] Bed Extension piece.   pantoprazole (PROTONIX) 40 MG tablet Take by mouth.   pioglitazone (ACTOS) 30 MG tablet  Take by mouth.   glucose blood test strip 1 each by Other route as needed. Use as instructed 4 x daily. E11.65. One touch ultra   lansoprazole (PREVACID) 30 MG capsule Take by mouth.   No current facility-administered medications for this visit. (Other)   REVIEW OF SYSTEMS: ROS   Positive for: Neurological, Genitourinary, Endocrine, Cardiovascular, Eyes Negative for: Constitutional, Gastrointestinal, Skin, Musculoskeletal, HENT, Respiratory, Psychiatric, Allergic/Imm, Heme/Lymph Last edited by Elmore Guise, COT on 12/04/2021  2:40 PM.     ALLERGIES No Known Allergies  PAST MEDICAL HISTORY Past Medical History:  Diagnosis Date   Diabetes ([place])     Diabetic retinopathy ([place])    NPDR OU   Hypertension    Hypertensive retinopathy    OU   Past Surgical History:  Procedure Laterality Date   CATARACT EXTRACTION Bilateral    Dr. Herbert Deaner   EYE SURGERY Bilateral    Cat Sx - Dr. Herbert Deaner   FAMILY HISTORY Family History  Problem Relation Age of Onset   Diabetes Father    Diabetes Sister    Diabetes Brother    SOCIAL HISTORY Social History   Tobacco Use   Smoking status: Never   Smokeless tobacco: Never  Vaping Use   Vaping Use: Never used  Substance Use Topics   Alcohol use: Never   Drug use: Never       OPHTHALMIC EXAM: Base Eye Exam     Visual Acuity (Snellen - Linear)       Right Left   Dist [place] 20/25+1 20/25-2         Tonometry (Tonopen, 2:42 PM)       Right Left   Pressure 14 10         Pupils       Dark Light Shape React APD   Right 3 2 Round Minimal None   Left 3 2 Round Minimal None         Visual Fields (Counting fingers)       Left Right    Full Full         Extraocular Movement       Right Left    Full, Ortho Full, Ortho         Neuro/Psych     Oriented x3: Yes   Mood/Affect: Normal         Dilation     Both eyes: 1.0% Mydriacyl @ 2:42 PM           Slit Lamp and Fundus Exam     Slit Lamp Exam       Right Left   Lids/Lashes Dermatochalasis - upper lid Dermatochalasis - upper lid   Conjunctiva/Sclera White and quiet White and quiet   Cornea 2+ Punctate epithelial erosions, well healed temporal cataract wounds trace Punctate epithelial erosions, mild arcus, well healed temporal cataract wounds   Anterior Chamber deep, clear, narrow temporal angle Deep and quiet   Iris Round and dilated, No NVI Round and dilated, No NVI   Lens PC IOL in good position PC IOL in good position   Anterior Vitreous Vitreous syneresis Vitreous syneresis         Fundus Exam       Right Left   Disc mild Pallor, Sharp rim, no NVD Pink and Sharp   C/D Ratio 0.2 0.3   Macula  good foveal reflex, scattered MA/DBH/CWS -- improved, +edema greatest SN - slightly improved, punctate exudates - improving, almost resolved good foveal reflex, scattered IRH --  improved, +cystic changes SN fovea and mac -- improved, +CWS, +punctate exudates - stably resolved   Vessels attenuated, Tortuous attenuated, Tortuous   Periphery Attached, scattered IRH/DBH/CWS greatest posteriorly - improved Attached, scattered IRH/DBH/CWS greatest nasal to disc           IMAGING AND PROCEDURES  Imaging and Procedures for 12/04/2021  OCT, Retina - OU - Both Eyes       Right Eye Quality was good. Central Foveal Thickness: 256. Progression has improved. Findings include no SRF, abnormal foveal contour, intraretinal hyper-reflective material, intraretinal fluid, vitreomacular adhesion (Mild interval improvement in IRF SN macula and fovea).   Left Eye Quality was good. Central Foveal Thickness: 282. Progression has improved. Findings include normal foveal contour, no SRF, intraretinal hyper-reflective material, intraretinal fluid, vitreomacular adhesion (Interval improvement in IRF SN macula and inferior macula ).   Notes *Images captured and stored on drive  Diagnosis / Impression:  DME OU OD: Mild interval improvement in IRF SN macula and fovea OS: Interval improvement in IRF SN macula and inferior macula   Clinical management:  See below  Abbreviations: NFP - Normal foveal profile. CME - cystoid macular edema. PED - pigment epithelial detachment. IRF - intraretinal fluid. SRF - subretinal fluid. EZ - ellipsoid zone. ERM - epiretinal membrane. ORA - outer retinal atrophy. ORT - outer retinal tubulation. SRHM - subretinal hyper-reflective material. IRHM - intraretinal hyper-reflective material      Intravitreal Injection, Pharmacologic Agent - OD - Right Eye       Time Out 12/04/2021. 3:01 PM. Confirmed correct patient, procedure, site, and patient consented.   Anesthesia Topical  anesthesia was used. Anesthetic medications included Lidocaine 2%, Proparacaine 0.5%.   Procedure Preparation included 5% betadine to ocular surface, eyelid speculum. A (32g) needle was used.   Injection: 2 mg aflibercept 2 MG/0.05ML   Route: Intravitreal, Site: Right Eye   NDC: A3590391, Lot: [id number], Expiration date: 01/23/2023, Waste: 0 mL   Post-op Post injection exam found visual acuity of at least counting fingers. The patient tolerated the procedure well. There were no complications. The patient received written and verbal post procedure care education. Post injection medications were not given.      Intravitreal Injection, Pharmacologic Agent - OS - Left Eye       Time Out 12/04/2021. 3:02 PM. Confirmed correct patient, procedure, site, and patient consented.   Anesthesia Topical anesthesia was used. Anesthetic medications included Lidocaine 2%, Proparacaine 0.5%.   Procedure Preparation included 5% betadine to ocular surface, eyelid speculum. A (32g) needle was used.   Injection: 2 mg aflibercept 2 MG/0.05ML   Route: Intravitreal, Site: Left Eye   NDC: A3590391, Lot: [id number], Expiration date: 12/23/2022, Waste: 0 mL   Post-op Post injection exam found visual acuity of at least counting fingers. The patient tolerated the procedure well. There were no complications. The patient received written and verbal post procedure care education. Post injection medications were not given.            ASSESSMENT/PLAN:    ICD-10-CM   1. 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 Branch retinal vein occlusion of right eye  with macular edema   H34.8310     3. Essential hypertension  I10     4. Hypertensive retinopathy of both eyes  H35.033     5. Pseudophakia, both eyes  Z96.1      1. Moderate non-proliferative diabetic retinopathy, OU (OD>OS)  - last A1c was 8.7 on 08.03.23  - s/p IVA OD #1 (12.31.21), #2 (02.04.22), #3 (03.04.22), #4 (04.01.22), #5 (05.05.22), #6 (07.15.22), #7 (11.09.22), #8 (12.21.22), #9 (02.01.23), #10 (03.01.23), #11 (04.05.23), #12 (05.10.23), #13 (06.14.23)  - s/p IVA OS #1 (01.04.22)  - s/p IVA OU #2 (02.04.22), #3 (03.04.22), #4 (04.01.22),#5 (05.05.22), #6 (07.15.22), #7 (11.09.22), #8 (12.21.22), #9 (02.01.23), #10 (03.01.23), #11 (04.05.23), #12 (05.10.23)  **IVA resistance OU**  - s/p IVE OS #1 (06.14.23), #2 (07.12.23), #3 (08.09.23)  - s/p IVE OD #1 (07.12.23), #2 (08.09.23) - exam shows scattered MA/DBH OU, central edema OU improving - BCVA stable at 20/25 OU  - OCT shows OD: Mild interval improvement in IRF SN macula and fovea; OS: Interval improvement in IRF SN macula and inferior macula at 5 weeks - recommend IVE OD #3 and IVE OS #4 today, 09.12.23 w/ f/u in 5 wks - pt wishes to proceed w/ injections  - RBA of procedure discussed, questions answered  - Avastin informed consent obtained and re-signed on 05.10.23 (OU)  - Eylea informed consent obtained and signed on 06.16.23 (OU) - see procedure note - f/u in 5 weeks, DFE, OCT, possible injections  2. BRVO w/ CME OD  - s/p IVA OD x13 and IVE as above - BCVA 20/25 - OCT shows interval improvement in IRF/IRHM SN macula and fovea - recommend IVE OD today as above - f/u in 5 wks-- DFE/OCT/possible injection  3,4. Hypertensive retinopathy OU - discussed importance of tight BP control -monitor  5. Pseudophakia OU  - s/p CE/IOL OU (Dr. Herbert Deaner)  - IOLs in good position, doing well - monitor  Ophthalmic Meds Ordered this visit:  Meds ordered this encounter  Medications   aflibercept (EYLEA) SOLN 2 mg   aflibercept (EYLEA) SOLN 2 mg      Return in about 5 weeks (around 01/08/2022) for f/u NPDR OU, DFE, OCT.  There are no Patient Instructions on file for this visit.  Explained the diagnoses, plan, and follow up with the patient and they expressed understanding.  Patient expressed understanding of the importance of proper follow up care.   This document serves as a record of services personally performed by Gardiner Sleeper, MD, PhD. It was created on their behalf by Renaldo Reel, [place] an ophthalmic technician. The creation of this record is the provider's dictation and/or activities during the visit.    Electronically signed by:  Renaldo Reel, COT  11/22/21 3:50 PM  This document serves as a record of services personally performed by Gardiner Sleeper, MD, PhD. It was created on their behalf by San Jetty. Owens Shark, OA an ophthalmic technician. The creation of this record is the provider's dictation and/or activities during the visit.    Electronically signed by: San Jetty. Owens Shark, [place] 09.12.2023 3:50 PM  Gardiner Sleeper, M.D., Ph.D. Diseases & Surgery of the Retina and Vitreous [facility]  I have reviewed the above documentation for accuracy and completeness, and I agree with the above. Gardiner Sleeper, M.D., Ph.D. 12/04/21 3:50 PM  Abbreviations: M myopia (nearsighted); A astigmatism; H hyperopia (farsighted); P presbyopia; Mrx spectacle prescription;  CTL contact lenses; OD right eye; OS  left eye; OU both eyes  XT exotropia; ET esotropia; PEK punctate epithelial keratitis; PEE punctate epithelial erosions; DES dry eye syndrome; MGD meibomian gland dysfunction; ATs artificial tears; PFAT's preservative free artificial tears; [place] nuclear sclerotic cataract; PSC posterior subcapsular cataract; ERM epi-retinal membrane; PVD posterior vitreous detachment; RD retinal detachment; DM diabetes mellitus; DR diabetic retinopathy; NPDR non-proliferative diabetic retinopathy; PDR proliferative diabetic  retinopathy; CSME clinically significant macular edema; DME diabetic macular edema; dbh dot blot hemorrhages; CWS cotton wool spot; POAG primary open angle glaucoma; C/D cup-to-disc ratio; HVF humphrey visual field; GVF goldmann visual field; OCT optical coherence tomography; IOP intraocular pressure; BRVO Branch retinal vein occlusion; CRVO central retinal vein occlusion; CRAO central retinal artery occlusion; BRAO branch retinal artery occlusion; RT retinal tear; SB scleral buckle; PPV pars plana vitrectomy; VH Vitreous hemorrhage; PRP panretinal laser photocoagulation; IVK intravitreal kenalog; VMT vitreomacular traction; MH Macular hole;  NVD neovascularization of the disc; NVE neovascularization elsewhere; AREDS age related eye disease study; ARMD age related macular degeneration; POAG primary open angle glaucoma; EBMD epithelial/anterior basement membrane dystrophy; ACIOL anterior chamber intraocular lens; IOL intraocular lens; PCIOL posterior chamber intraocular lens; Phaco/IOL phacoemulsification with intraocular lens placement; [place] photorefractive keratectomy; LASIK laser assisted in situ keratomileusis; HTN hypertension; DM diabetes mellitus; COPD chronic obstructive pulmonary disease

## 2021-12-04 ENCOUNTER — Ambulatory Visit (INDEPENDENT_AMBULATORY_CARE_PROVIDER_SITE_OTHER): Payer: Medicare Other | Admitting: Ophthalmology

## 2021-12-04 ENCOUNTER — Encounter (INDEPENDENT_AMBULATORY_CARE_PROVIDER_SITE_OTHER): Payer: Self-pay | Admitting: Ophthalmology

## 2021-12-04 DIAGNOSIS — I10 Essential (primary) hypertension: Secondary | ICD-10-CM

## 2021-12-04 DIAGNOSIS — E113313 Type 2 diabetes mellitus with moderate nonproliferative diabetic retinopathy with macular edema, bilateral: Secondary | ICD-10-CM

## 2021-12-04 DIAGNOSIS — H34831 Tributary (branch) retinal vein occlusion, right eye, with macular edema: Secondary | ICD-10-CM

## 2021-12-04 DIAGNOSIS — H35033 Hypertensive retinopathy, bilateral: Secondary | ICD-10-CM

## 2021-12-04 DIAGNOSIS — Z961 Presence of intraocular lens: Secondary | ICD-10-CM

## 2021-12-04 MED ORDER — AFLIBERCEPT 2MG/0.05ML IZ SOLN FOR KALEIDOSCOPE
2.0000 mg | INTRAVITREAL | Status: AC | PRN
  Administered 2021-12-04: 2 mg via INTRAVITREAL

## 2021-12-26 NOTE — Progress Notes (Signed)
[facility] Note  01/08/2022     CHIEF COMPLAINT Patient presents for Retina Follow Up   HISTORY OF PRESENT ILLNESS: Thomas Finley is a 66 y.o. male who presents to the clinic today for:   HPI     Retina Follow Up   Patient presents with  Diabetic Retinopathy.  In both eyes.  Severity is mild.  Duration of 5 weeks.  Since onset it is stable.  I, the attending physician,  performed the HPI with the patient and updated documentation appropriately.        Comments   Pt is here for 5 week NPDR OU IVE OU pt is reporting no vision changes noticed he denies flashes or floaters BS 182 this am A1C unknown       Last edited by Bernarda Caffey, MD on 01/08/2022  4:49 PM.     Pt states no change in vision, pt just had all of his teeth extracted, he was given medication for infection that caused his blood sugar to spike  Referring physician: Kennieth Rad, MD [address] [place] [address]  HISTORICAL INFORMATION:   Selected notes from the MEDICAL RECORD NUMBER Referred by Dr. Monna Fam for concern of CRVO   CURRENT MEDICATIONS: No current outpatient medications on file. (Ophthalmic Drugs)   No current facility-administered medications for this visit. (Ophthalmic Drugs)   Current Outpatient Medications (Other)  Medication Sig   atorvastatin (LIPITOR) 10 MG tablet Take 10 mg by mouth daily.   blood glucose meter kit and supplies KIT Dispense based on patient and insurance preference. Use up to four times daily as directed. (FOR ICD-10 E11.65).   Blood Glucose Monitoring Suppl (ACCU-CHEK GUIDE) w/Device KIT 1 Piece by Does not apply route as directed.   buPROPion (WELLBUTRIN XL) 300 MG 24 hr tablet Take 300 mg by mouth daily.   cephALEXin (KEFLEX) 500 MG capsule Take 500 mg by mouth 2 (two) times daily.   cyanocobalamin 1000 MCG tablet Take by mouth.   donepezil (ARICEPT) 5 MG tablet Take by mouth.   doxycycline (ADOXA) 100 MG  tablet Take 100 mg by mouth 2 (two) times daily. for 10 days   Dulaglutide (TRULICITY) 1.5 ZY/6.0YT SOPN Inject into the skin once a week.   escitalopram (LEXAPRO) 20 MG tablet Take by mouth.   fexofenadine (ALLEGRA) 180 MG tablet Take 180 mg by mouth daily.   FLUoxetine (PROZAC) 20 MG tablet daily.   FLUoxetine (PROZAC) 40 MG capsule 1 capsule DAILY (route: oral)   glipiZIDE (GLUCOTROL) 10 MG tablet Take 5 mg by mouth 2 (two) times daily with a meal.   glucose blood (ACCU-CHEK GUIDE) test strip Use as instructed   glucose blood test strip 1 each by Other route as needed. Use as instructed 4 x daily. E11.65. One touch ultra   hydrOXYzine (ATARAX/VISTARIL) 25 MG tablet Take 25 mg by mouth 2 (two) times daily.   insulin degludec (TRESIBA FLEXTOUCH) 100 UNIT/ML SOPN FlexTouch Pen Inject 0.2 mLs (20 Units total) into the skin daily.   insulin glargine, 2 Unit Dial, (TOUJEO MAX SOLOSTAR) 300 UNIT/ML Solostar Pen Inject into the skin.   Insulin Glargine-Lixisenatide 100-33 UNT-MCG/ML SOPN Inject into the skin.   insulin lispro (HUMALOG) 100 UNIT/ML KwikPen Inject into the skin.   Insulin Pen Needle (BD PEN NEEDLE NANO U/F) 32G X 4 MM MISC 1 each by Does not apply route at bedtime.   lansoprazole (PREVACID) 30 MG capsule Take  by mouth.   linagliptin (TRADJENTA) 5 MG TABS tablet Take 1 tablet (5 mg total) by mouth daily.   lisinopril (PRINIVIL,ZESTRIL) 2.5 MG tablet Take 2.5 mg by mouth daily.   metoCLOPramide (REGLAN) 10 MG tablet Take by mouth.   Misc. [facility] Bed Extension piece.   pantoprazole (PROTONIX) 40 MG tablet Take by mouth.   pioglitazone (ACTOS) 30 MG tablet Take by mouth.   No current facility-administered medications for this visit. (Other)   REVIEW OF SYSTEMS:   ALLERGIES No Known Allergies  PAST MEDICAL HISTORY Past Medical History:  Diagnosis Date   Diabetes ([place])    Diabetic retinopathy ([place])    NPDR OU   Hypertension    Hypertensive retinopathy    OU    Past Surgical History:  Procedure Laterality Date   CATARACT EXTRACTION Bilateral    Dr. Herbert Deaner   EYE SURGERY Bilateral    Cat Sx - Dr. Herbert Deaner   FAMILY HISTORY Family History  Problem Relation Age of Onset   Diabetes Father    Diabetes Sister    Diabetes Brother    SOCIAL HISTORY Social History   Tobacco Use   Smoking status: Never   Smokeless tobacco: Never  Vaping Use   Vaping Use: Never used  Substance Use Topics   Alcohol use: Never   Drug use: Never       OPHTHALMIC EXAM: Base Eye Exam     Visual Acuity (Snellen - Linear)       Right Left   Dist [place] 20/30 -1 20/25 -1   Dist ph  NI NI         Tonometry (Tonopen, 2:22 PM)       Right Left   Pressure 18 15  Squeezing         Pupils       Dark Light Shape React APD   Right 3 2 Round Brisk None   Left 3 2 Round Brisk None         Visual Fields       Left Right    Full Full         Extraocular Movement       Right Left    Full Full         Neuro/Psych     Oriented x3: Yes   Mood/Affect: Normal         Dilation     Both eyes: 2.5% Phenylephrine @ 2:22 PM           Slit Lamp and Fundus Exam     Slit Lamp Exam       Right Left   Lids/Lashes Dermatochalasis - upper lid Dermatochalasis - upper lid   Conjunctiva/Sclera White and quiet White and quiet   Cornea 2+ Punctate epithelial erosions, well healed temporal cataract wounds trace Punctate epithelial erosions, mild arcus, well healed temporal cataract wounds   Anterior Chamber deep, clear, narrow temporal angle Deep and quiet   Iris Round and dilated, No NVI Round and dilated, No NVI   Lens PC IOL in good position PC IOL in good position   Anterior Vitreous Vitreous syneresis Vitreous syneresis         Fundus Exam       Right Left   Disc mild Pallor, Sharp rim, no NVD Pink and Sharp   C/D Ratio 0.2 0.3   Macula good foveal reflex, scattered MA/DBH/CWS -- improved, +edema greatest SN - improved, punctate  exudates - improving, almost  resolved good foveal reflex, scattered IRH -- improved, +cystic changes SN fovea and mac -- improved, +CWS   Vessels attenuated, Tortuous attenuated, Tortuous   Periphery Attached, scattered IRH/DBH/CWS greatest posteriorly - improved Attached, scattered IRH/DBH/CWS greatest nasal to disc           IMAGING AND PROCEDURES  Imaging and Procedures for 01/08/2022  OCT, Retina - OU - Both Eyes       Right Eye Quality was good. Central Foveal Thickness: 256. Progression has improved. Findings include no SRF, abnormal foveal contour, intraretinal hyper-reflective material, intraretinal fluid, vitreomacular adhesion (interval improvement in IRF/edema SN macula and fovea).   Left Eye Quality was good. Central Foveal Thickness: 264. Progression has improved. Findings include normal foveal contour, no SRF, intraretinal hyper-reflective material, intraretinal fluid, vitreomacular adhesion (Interval improvement in IRF /cystic changes SN macula ).   Notes *Images captured and stored on drive  Diagnosis / Impression:  DME OU OD: Mild interval improvement in IRF SN macula and fovea OS: Interval improvement in IRF /cystic changes SN macula   Clinical management:  See below  Abbreviations: NFP - Normal foveal profile. CME - cystoid macular edema. PED - pigment epithelial detachment. IRF - intraretinal fluid. SRF - subretinal fluid. EZ - ellipsoid zone. ERM - epiretinal membrane. ORA - outer retinal atrophy. ORT - outer retinal tubulation. SRHM - subretinal hyper-reflective material. IRHM - intraretinal hyper-reflective material      Intravitreal Injection, Pharmacologic Agent - OD - Right Eye       Time Out 01/08/2022. 2:46 PM. Confirmed correct patient, procedure, site, and patient consented.   Anesthesia Topical anesthesia was used. Anesthetic medications included Lidocaine 2%, Proparacaine 0.5%.   Procedure Preparation included 5% betadine to ocular  surface, eyelid speculum. A (32g) needle was used.   Injection: 2 mg aflibercept 2 MG/0.05ML   Route: Intravitreal, Site: Right Eye   NDC: A3590391, Lot: [id number], Expiration date: 04/25/2023, Waste: 0 mL   Post-op Post injection exam found visual acuity of at least counting fingers. The patient tolerated the procedure well. There were no complications. The patient received written and verbal post procedure care education. Post injection medications were not given.      Intravitreal Injection, Pharmacologic Agent - OS - Left Eye       Time Out 01/08/2022. 2:47 PM. Confirmed correct patient, procedure, site, and patient consented.   Anesthesia Topical anesthesia was used. Anesthetic medications included Lidocaine 2%, Proparacaine 0.5%.   Procedure Preparation included 5% betadine to ocular surface, eyelid speculum. A (32g) needle was used.   Injection: 2 mg aflibercept 2 MG/0.05ML   Route: Intravitreal, Site: Left Eye   NDC: A3590391, Lot: [id number], Expiration date: 04/25/2023, Waste: 0 mL   Post-op Post injection exam found visual acuity of at least counting fingers. The patient tolerated the procedure well. There were no complications. The patient received written and verbal post procedure care education. Post injection medications were not given.            ASSESSMENT/PLAN:    ICD-10-CM   1. 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 Branch retinal vein occlusion of right eye with  macular edema  H34.8310     3. Essential hypertension  I10     4. Hypertensive retinopathy of both eyes  H35.033     5. Pseudophakia, both eyes  Z96.1        1. Moderate non-proliferative diabetic retinopathy, OU (OD>OS)  - last A1c was 8.7 on 08.03.23  - s/p IVA OD #1 (12.31.21), #2 (02.04.22), #3 (03.04.22), #4 (04.01.22), #5 (05.05.22), #6 (07.15.22), #7 (11.09.22), #8 (12.21.22), #9 (02.01.23), #10 (03.01.23), #11 (04.05.23), #12 (05.10.23), #13 (06.14.23)  - s/p IVA OS #1 (01.04.22)  - s/p IVA OU #2 (02.04.22), #3 (03.04.22), #4 (04.01.22),#5 (05.05.22), #6 (07.15.22), #7 (11.09.22), #8 (12.21.22), #9 (02.01.23), #10 (03.01.23), #11 (04.05.23), #12 (05.10.23)  **IVA resistance OU**  - s/p IVE OS #1 (06.14.23), #2 (07.12.23), #3 (08.09.23), #4 (09.12.23)  - s/p IVE OD #1 (07.12.23), #2 (08.09.23), #3 (09.12.23) - exam shows scattered MA/DBH OU, central edema OU improving - BCVA stable at 20/25 OU  - OCT shows OD: Mild interval improvement in IRF SN macula and fovea; OS: Interval improvement in IRF /cystic changes SN macula  - recommend IVE OD #4 and IVE OS #5 today, 10.17.23 w/ f/u in 5 wks - pt wishes to proceed w/ injections  - RBA of procedure discussed, questions answered  - Avastin informed consent obtained and re-signed on 05.10.23 (OU)  - Eylea informed consent obtained and signed on 06.16.23 (OU) - see procedure note - f/u in 5 weeks, DFE, OCT, possible injections  2. BRVO w/ CME OD  - s/p IVA OD x13 and IVE as above - BCVA 20/25 - OCT shows interval improvement in IRF/IRHM SN macula and fovea - recommend IVE OD today as above - f/u in 5 wks-- DFE/OCT/possible injection  3,4. Hypertensive retinopathy OU - discussed importance of tight BP control -monitor  5. Pseudophakia OU  - s/p CE/IOL OU (Dr. Herbert Deaner)  - IOLs in good position, doing well - monitor  Ophthalmic Meds Ordered this visit:  Meds ordered this encounter  Medications   aflibercept (EYLEA) SOLN 2 mg   aflibercept (EYLEA) SOLN 2 mg     Return in about 5 weeks (around 02/12/2022) for f/u NPDR OU, DFE, OCT.  There are no Patient Instructions on file for  this visit.  Explained the diagnoses, plan, and follow up with the patient and they expressed understanding.  Patient expressed understanding of the importance of proper follow up care.   This document serves as a record of services personally performed by Gardiner Sleeper, MD, PhD. It was created on their behalf by Renaldo Reel, [place] an ophthalmic technician. The creation of this record is the provider's dictation and/or activities during the visit.    Electronically signed by:  Renaldo Reel, COT  10.04.23 11:52 PM  This document serves as a record of services personally performed by Gardiner Sleeper, MD, PhD. It was created on their behalf by San Jetty. Owens Shark, OA an ophthalmic technician. The creation of this record is the provider's dictation and/or activities during the visit.    Electronically signed by: San Jetty. Owens Shark, [place] 10.17.2023 11:52 PM   Gardiner Sleeper, M.D., Ph.D. Diseases & Surgery of the Retina and Vitreous [facility]  I have reviewed the above documentation for accuracy and completeness, and I agree with the above. Gardiner Sleeper, M.D., Ph.D. 01/09/22 12:00 AM   Abbreviations: M myopia (nearsighted); A astigmatism; H hyperopia (farsighted); P presbyopia; Mrx spectacle prescription;  CTL contact lenses; OD  right eye; OS left eye; OU both eyes  XT exotropia; ET esotropia; PEK punctate epithelial keratitis; PEE punctate epithelial erosions; DES dry eye syndrome; MGD meibomian gland dysfunction; ATs artificial tears; PFAT's preservative free artificial tears; [place]illi[place] nuclear sclerotic cataract; PSC posterior subcapsular cataract; ERM epi-retinal membrane; PVD posterior vitreous detachment; RD retinal detachment; DM diabetes mellitus; DR diabetic retinopathy; NPDR non-proliferative diabetic retinopathy; PDR proliferative diabetic retinopathy; CSME clinically significant macular edema; DME diabetic macular edema; dbh dot blot hemorrhages; CWS cotton wool  spot; POAG primary open angle glaucoma; C/D cup-to-disc ratio; HVF humphrey visual field; GVF goldmann visual field; OCT optical coherence tomography; IOP intraocular pressure; BRVO Branch retinal vein occlusion; CRVO central retinal vein occlusion; CRAO central retinal artery occlusion; BRAO branch retinal artery occlusion; RT retinal tear; SB scleral buckle; PPV pars plana vitrectomy; VH Vitreous hemorrhage; PRP panretinal laser photocoagulation; IVK intravitreal kenalog; VMT vitreomacular traction; MH Macular hole;  NVD neovascularization of the disc; NVE neovascularization elsewhere; AREDS age related eye disease study; ARMD age related macular degeneration; POAG primary open angle glaucoma; EBMD epithelial/anterior basement membrane dystrophy; ACIOL anterior chamber intraocular lens; IOL intraocular lens; PCIOL posterior chamber intraocular lens; Phaco/IOL phacoemulsification with intraocular lens placement; [place] photorefractive keratectomy; LASIK laser assisted in situ keratomileusis; HTN hypertension; DM diabetes mellitus; COPD chronic obstructive pulmonary disease

## 2022-01-08 ENCOUNTER — Encounter (INDEPENDENT_AMBULATORY_CARE_PROVIDER_SITE_OTHER): Payer: Self-pay | Admitting: Ophthalmology

## 2022-01-08 ENCOUNTER — Ambulatory Visit (INDEPENDENT_AMBULATORY_CARE_PROVIDER_SITE_OTHER): Payer: Medicare Other | Admitting: Ophthalmology

## 2022-01-08 DIAGNOSIS — H35033 Hypertensive retinopathy, bilateral: Secondary | ICD-10-CM

## 2022-01-08 DIAGNOSIS — E113313 Type 2 diabetes mellitus with moderate nonproliferative diabetic retinopathy with macular edema, bilateral: Secondary | ICD-10-CM

## 2022-01-08 DIAGNOSIS — I10 Essential (primary) hypertension: Secondary | ICD-10-CM | POA: Diagnosis not present

## 2022-01-08 DIAGNOSIS — H34831 Tributary (branch) retinal vein occlusion, right eye, with macular edema: Secondary | ICD-10-CM | POA: Diagnosis not present

## 2022-01-08 DIAGNOSIS — Z961 Presence of intraocular lens: Secondary | ICD-10-CM

## 2022-01-08 MED ORDER — AFLIBERCEPT 2MG/0.05ML IZ SOLN FOR KALEIDOSCOPE
2.0000 mg | INTRAVITREAL | Status: AC | PRN
  Administered 2022-01-08: 2 mg via INTRAVITREAL

## 2022-01-29 NOTE — Progress Notes (Signed)
[facility] Note  02/12/2022     CHIEF COMPLAINT Patient presents for Retina Follow Up   HISTORY OF PRESENT ILLNESS: Thomas Finley is a 66 y.o. male who presents to the clinic today for:   HPI     Retina Follow Up   Patient presents with  Diabetic Retinopathy.  In both eyes.  Severity is moderate.  Duration of 5 weeks.  Since onset it is stable.  I, the attending physician,  performed the HPI with the patient and updated documentation appropriately.        Comments   Pt here for 5 wk ret f/u NPDR OU. Pt states VA is the same, no changes.       Last edited by Bernarda Caffey, MD on 02/12/2022  5:34 PM.    Pt states   Referring physician: Kennieth Rad, MD [address] [place] [address]  HISTORICAL INFORMATION:   Selected notes from the MEDICAL RECORD NUMBER Referred by Dr. Monna Fam for concern of CRVO   CURRENT MEDICATIONS: No current outpatient medications on file. (Ophthalmic Drugs)   No current facility-administered medications for this visit. (Ophthalmic Drugs)   Current Outpatient Medications (Other)  Medication Sig   atorvastatin (LIPITOR) 10 MG tablet Take 10 mg by mouth daily.   blood glucose meter kit and supplies KIT Dispense based on patient and insurance preference. Use up to four times daily as directed. (FOR ICD-10 E11.65).   Blood Glucose Monitoring Suppl (ACCU-CHEK GUIDE) w/Device KIT 1 Piece by Does not apply route as directed.   buPROPion (WELLBUTRIN XL) 300 MG 24 hr tablet Take 300 mg by mouth daily.   cephALEXin (KEFLEX) 500 MG capsule Take 500 mg by mouth 2 (two) times daily.   cyanocobalamin 1000 MCG tablet Take by mouth.   donepezil (ARICEPT) 5 MG tablet Take by mouth.   doxycycline (ADOXA) 100 MG tablet Take 100 mg by mouth 2 (two) times daily. for 10 days   Dulaglutide (TRULICITY) 1.5 LH/7.3SK SOPN Inject into the skin once a week.   escitalopram (LEXAPRO) 20 MG tablet Take by mouth.    fexofenadine (ALLEGRA) 180 MG tablet Take 180 mg by mouth daily.   FLUoxetine (PROZAC) 20 MG tablet daily.   FLUoxetine (PROZAC) 40 MG capsule 1 capsule DAILY (route: oral)   glipiZIDE (GLUCOTROL) 10 MG tablet Take 5 mg by mouth 2 (two) times daily with a meal.   glucose blood (ACCU-CHEK GUIDE) test strip Use as instructed   glucose blood test strip 1 each by Other route as needed. Use as instructed 4 x daily. E11.65. One touch ultra   hydrOXYzine (ATARAX/VISTARIL) 25 MG tablet Take 25 mg by mouth 2 (two) times daily.   insulin degludec (TRESIBA FLEXTOUCH) 100 UNIT/ML SOPN FlexTouch Pen Inject 0.2 mLs (20 Units total) into the skin daily.   insulin glargine, 2 Unit Dial, (TOUJEO MAX SOLOSTAR) 300 UNIT/ML Solostar Pen Inject into the skin.   Insulin Glargine-Lixisenatide 100-33 UNT-MCG/ML SOPN Inject into the skin.   insulin lispro (HUMALOG) 100 UNIT/ML KwikPen Inject into the skin.   Insulin Pen Needle (BD PEN NEEDLE NANO U/F) 32G X 4 MM MISC 1 each by Does not apply route at bedtime.   linagliptin (TRADJENTA) 5 MG TABS tablet Take 1 tablet (5 mg total) by mouth daily.   lisinopril (PRINIVIL,ZESTRIL) 2.5 MG tablet Take 2.5 mg by mouth daily.   metoCLOPramide (REGLAN) 10 MG tablet Take by mouth.   Misc. Devices  [facility] Bed Extension piece.   pantoprazole (PROTONIX) 40 MG tablet Take by mouth.   pioglitazone (ACTOS) 30 MG tablet Take by mouth.   lansoprazole (PREVACID) 30 MG capsule Take by mouth.   No current facility-administered medications for this visit. (Other)   REVIEW OF SYSTEMS: ROS   Positive for: Neurological, Genitourinary, Endocrine, Cardiovascular, Eyes Negative for: Constitutional, Gastrointestinal, Skin, Musculoskeletal, HENT, Respiratory, Psychiatric, Allergic/Imm, Heme/Lymph Last edited by Kingsley Spittle, COT on 02/12/2022  2:24 PM.     ALLERGIES No Known Allergies  PAST MEDICAL HISTORY Past Medical History:  Diagnosis Date   Diabetes ([place])    Diabetic  retinopathy ([place])    NPDR OU   Hypertension    Hypertensive retinopathy    OU   Past Surgical History:  Procedure Laterality Date   CATARACT EXTRACTION Bilateral    Dr. Herbert Deaner   EYE SURGERY Bilateral    Cat Sx - Dr. Herbert Deaner   FAMILY HISTORY Family History  Problem Relation Age of Onset   Diabetes Father    Diabetes Sister    Diabetes Brother    SOCIAL HISTORY Social History   Tobacco Use   Smoking status: Never   Smokeless tobacco: Never  Vaping Use   Vaping Use: Never used  Substance Use Topics   Alcohol use: Never   Drug use: Never       OPHTHALMIC EXAM: Base Eye Exam     Visual Acuity (Snellen - Linear)       Right Left   Dist [place] 20/30 20/20   Dist ph [place] 20/25 -2          Tonometry (Tonopen, 2:28 PM)       Right Left   Pressure 15 14         Pupils       Dark Light Shape React APD   Right 3 2 Round Brisk None   Left 3 2 Round Brisk None         Visual Fields (Counting fingers)       Left Right    Full Full         Extraocular Movement       Right Left    Full, Ortho Full, Ortho         Neuro/Psych     Oriented x3: Yes   Mood/Affect: Normal         Dilation     Both eyes: 1.0% Mydriacyl, 2.5% Phenylephrine @ 2:29 PM           Slit Lamp and Fundus Exam     Slit Lamp Exam       Right Left   Lids/Lashes Dermatochalasis - upper lid Dermatochalasis - upper lid   Conjunctiva/Sclera White and quiet White and quiet   Cornea 2+ Punctate epithelial erosions, well healed temporal cataract wounds trace Punctate epithelial erosions, mild arcus, well healed temporal cataract wounds   Anterior Chamber deep, clear, narrow temporal angle Deep and quiet   Iris Round and dilated, No NVI Round and dilated, No NVI   Lens PC IOL in good position PC IOL in good position   Anterior Vitreous Vitreous syneresis Vitreous syneresis         Fundus Exam       Right Left   Disc mild Pallor, Sharp rim, no NVD Pink and Sharp   C/D  Ratio 0.2 0.3   Macula good foveal reflex, scattered MA/DBH/CWS -- improved, +edema greatest SN - improved, punctate exudates -  improving, almost resolved good foveal reflex, scattered IRH -- improved, +cystic changes SN fovea and mac -- improved, +CWS, focal punctate exudate nasal mac   Vessels attenuated, Tortuous attenuated, Tortuous   Periphery Attached, scattered IRH/DBH/CWS greatest posteriorly - improved Attached, scattered IRH/DBH/CWS greatest nasal to disc           IMAGING AND PROCEDURES  Imaging and Procedures for 02/12/2022  OCT, Retina - OU - Both Eyes       Right Eye Quality was good. Central Foveal Thickness: 240. Progression has improved. Findings include normal foveal contour, no SRF, intraretinal hyper-reflective material, intraretinal fluid, vitreomacular adhesion (interval improvement in IRF/edema SN macula and fovea, no central cysts).   Left Eye Quality was good. Central Foveal Thickness: 257. Progression has improved. Findings include normal foveal contour, no SRF, intraretinal hyper-reflective material, intraretinal fluid, vitreomacular adhesion (Interval improvement in IRF /cystic changes SN macula ).   Notes *Images captured and stored on drive  Diagnosis / Impression:  DME OU OD: Mild interval improvement in IRF SN macula and fovea OS: Interval improvement in IRF /cystic changes SN macula   Clinical management:  See below  Abbreviations: NFP - Normal foveal profile. CME - cystoid macular edema. PED - pigment epithelial detachment. IRF - intraretinal fluid. SRF - subretinal fluid. EZ - ellipsoid zone. ERM - epiretinal membrane. ORA - outer retinal atrophy. ORT - outer retinal tubulation. SRHM - subretinal hyper-reflective material. IRHM - intraretinal hyper-reflective material      Intravitreal Injection, Pharmacologic Agent - OD - Right Eye       Time Out 02/12/2022. 3:29 PM. Confirmed correct patient, procedure, site, and patient consented.    Anesthesia Topical anesthesia was used. Anesthetic medications included Lidocaine 2%, Proparacaine 0.5%.   Procedure Preparation included 5% betadine to ocular surface, eyelid speculum. A (32g) needle was used.   Injection: 2 mg aflibercept 2 MG/0.05ML   Route: Intravitreal, Site: Right Eye   NDC: A3590391, Lot: [id number], Expiration date: 04/24/2023, Waste: 0 mL   Post-op Post injection exam found visual acuity of at least counting fingers. The patient tolerated the procedure well. There were no complications. The patient received written and verbal post procedure care education. Post injection medications were not given.      Intravitreal Injection, Pharmacologic Agent - OS - Left Eye       Time Out 02/12/2022. 3:30 PM. Confirmed correct patient, procedure, site, and patient consented.   Anesthesia Topical anesthesia was used. Anesthetic medications included Lidocaine 2%, Proparacaine 0.5%.   Procedure Preparation included 5% betadine to ocular surface, eyelid speculum. A (32g) needle was used.   Injection: 2 mg aflibercept 2 MG/0.05ML   Route: Intravitreal, Site: Left Eye   NDC: A3590391, Lot: [id number], Expiration date: 05/15/2023, Waste: 0 mL   Post-op Post injection exam found visual acuity of at least counting fingers. The patient tolerated the procedure well. There were no complications. The patient received written and verbal post procedure care education. Post injection medications were not given.            ASSESSMENT/PLAN:    ICD-10-CM   1. 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 Branch retinal vein occlusion of right eye  with macular edema  H34.8310     3. Essential hypertension  I10     4. Hypertensive retinopathy of both eyes  H35.033     5. Pseudophakia, both eyes  Z96.1      1. Moderate non-proliferative diabetic retinopathy, OU (OD>OS)  - last A1c was 8.7 on 08.03.23  - s/p IVA OD #1 (12.31.21), #2 (02.04.22), #3 (03.04.22), #4 (04.01.22), #5 (05.05.22), #6 (07.15.22), #7 (11.09.22), #8 (12.21.22), #9 (02.01.23), #10 (03.01.23), #11 (04.05.23), #12 (05.10.23), #13 (06.14.23)  - s/p IVA OS #1 (01.04.22)  - s/p IVA OU #2 (02.04.22), #3 (03.04.22), #4 (04.01.22),#5 (05.05.22), #6 (07.15.22), #7 (11.09.22), #8 (12.21.22), #9 (02.01.23), #10 (03.01.23), #11 (04.05.23), #12 (05.10.23)  **IVA resistance OU**  - s/p IVE OS #1 (06.14.23), #2 (07.12.23), #3 (08.09.23), #4 (09.12.23), #5 (10.17.23)  - s/p IVE OD #1 (07.12.23), #2 (08.09.23), #3 (09.12.23), #4 (10.17.23) - exam shows scattered MA/DBH OU, central edema OU improving - BCVA 20/25 OD, 20/20 OS -- improved OU  - OCT shows OD: Mild interval improvement in IRF SN macula and fovea; OS: Interval improvement in IRF /cystic changes SN macula at 5 weeks - recommend IVE OD #5 and IVE OS #6 today, 11.21.23 w/ f/u ext to 6 wks - pt wishes to proceed w/ injections  - RBA of procedure discussed, questions answered  - Avastin informed consent obtained and re-signed on 05.10.23 (OU)  - Eylea informed consent obtained and signed on 06.16.23 (OU) - see procedure note - f/u in 6 weeks, DFE, OCT, possible injections  2. BRVO w/ CME OD  - s/p IVA OD x13 and IVE as above - BCVA 20/25 - OCT shows interval improvement in IRF/IRHM SN macula and fovea - recommend IVE OD today as above - f/u in 5 wks-- DFE/OCT/possible injection  3,4. Hypertensive retinopathy OU - discussed importance of tight BP control -monitor  5. Pseudophakia OU  - s/p CE/IOL OU (Dr. Herbert Deaner)  - IOLs in good position, doing well - monitor  Ophthalmic Meds Ordered this visit:  Meds ordered  this encounter  Medications   aflibercept (EYLEA) SOLN 2 mg   aflibercept (EYLEA) SOLN 2 mg     Return in about 6 weeks (around 03/26/2022) for f/u NPDR OU, DFE, OCT.  There are no Patient Instructions on file for this visit.  Explained the diagnoses, plan, and follow up with the patient and they expressed understanding.  Patient expressed understanding of the importance of proper follow up care.   This document serves as a record of services personally performed by Gardiner Sleeper, MD, PhD. It was created on their behalf by Renaldo Reel, [place] an ophthalmic technician. The creation of this record is the provider's dictation and/or activities during the visit.    Electronically signed by:  Renaldo Reel, COT  11.07.23 5:37 PM  This document serves as a record of services personally performed by Gardiner Sleeper, MD, PhD. It was created on their behalf by San Jetty. Owens Shark, OA an ophthalmic technician. The creation of this record is the provider's dictation and/or activities during the visit.    Electronically signed by: San Jetty. Owens Shark, [place] 11.21.2023 5:37 PM  Gardiner Sleeper, M.D., Ph.D. Diseases & Surgery of the Retina and Vitreous [facility]  I have reviewed the above documentation for accuracy and completeness, and I agree with the above. Gardiner Sleeper, M.D., Ph.D. 02/12/22 5:37 PM  Abbreviations: Jerilynn Mages myopia (  nearsighted); A astigmatism; H hyperopia (farsighted); P presbyopia; Mrx spectacle prescription;  CTL contact lenses; OD right eye; OS left eye; OU both eyes  XT exotropia; ET esotropia; PEK punctate epithelial keratitis; PEE punctate epithelial erosions; DES dry eye syndrome; MGD meibomian gland dysfunction; ATs artificial tears; PFAT's preservative free artificial tears; [place] nuclear sclerotic cataract; PSC posterior subcapsular cataract; ERM epi-retinal membrane; PVD posterior vitreous detachment; RD retinal detachment; DM diabetes mellitus; DR  diabetic retinopathy; NPDR non-proliferative diabetic retinopathy; PDR proliferative diabetic retinopathy; CSME clinically significant macular edema; DME diabetic macular edema; dbh dot blot hemorrhages; CWS cotton wool spot; POAG primary open angle glaucoma; C/D cup-to-disc ratio; HVF humphrey visual field; GVF goldmann visual field; OCT optical coherence tomography; IOP intraocular pressure; BRVO Branch retinal vein occlusion; CRVO central retinal vein occlusion; CRAO central retinal artery occlusion; BRAO branch retinal artery occlusion; RT retinal tear; SB scleral buckle; PPV pars plana vitrectomy; VH Vitreous hemorrhage; PRP panretinal laser photocoagulation; IVK intravitreal kenalog; VMT vitreomacular traction; MH Macular hole;  NVD neovascularization of the disc; NVE neovascularization elsewhere; AREDS age related eye disease study; ARMD age related macular degeneration; POAG primary open angle glaucoma; EBMD epithelial/anterior basement membrane dystrophy; ACIOL anterior chamber intraocular lens; IOL intraocular lens; PCIOL posterior chamber intraocular lens; Phaco/IOL phacoemulsification with intraocular lens placement; [place] photorefractive keratectomy; LASIK laser assisted in situ keratomileusis; HTN hypertension; DM diabetes mellitus; COPD chronic obstructive pulmonary disease

## 2022-02-12 ENCOUNTER — Encounter (INDEPENDENT_AMBULATORY_CARE_PROVIDER_SITE_OTHER): Payer: Self-pay | Admitting: Ophthalmology

## 2022-02-12 ENCOUNTER — Ambulatory Visit (INDEPENDENT_AMBULATORY_CARE_PROVIDER_SITE_OTHER): Payer: Medicare Other | Admitting: Ophthalmology

## 2022-02-12 DIAGNOSIS — H34831 Tributary (branch) retinal vein occlusion, right eye, with macular edema: Secondary | ICD-10-CM

## 2022-02-12 DIAGNOSIS — H35033 Hypertensive retinopathy, bilateral: Secondary | ICD-10-CM | POA: Diagnosis not present

## 2022-02-12 DIAGNOSIS — E113313 Type 2 diabetes mellitus with moderate nonproliferative diabetic retinopathy with macular edema, bilateral: Secondary | ICD-10-CM | POA: Diagnosis not present

## 2022-02-12 DIAGNOSIS — Z961 Presence of intraocular lens: Secondary | ICD-10-CM

## 2022-02-12 DIAGNOSIS — I10 Essential (primary) hypertension: Secondary | ICD-10-CM | POA: Diagnosis not present

## 2022-02-12 MED ORDER — AFLIBERCEPT 2MG/0.05ML IZ SOLN FOR KALEIDOSCOPE
2.0000 mg | INTRAVITREAL | Status: AC | PRN
  Administered 2022-02-12: 2 mg via INTRAVITREAL

## 2022-03-13 NOTE — Progress Notes (Signed)
[facility] Note  03/26/2022     CHIEF COMPLAINT Patient presents for Retina Follow Up   HISTORY OF PRESENT ILLNESS: Thomas Finley is a 66 y.o. male who presents to the clinic today for:   HPI     Retina Follow Up   Patient presents with  Diabetic Retinopathy.  In both eyes.  Severity is moderate.  Duration of 6 weeks.  Since onset it is stable.  I, the attending physician,  performed the HPI with the patient and updated documentation appropriately.        Comments   Pt here for 6 wk ret f/u PDR OU. Pt states [facility] the same, no changes. Most recent A1C done a few weeks ago was 9.4, up from last.       Last edited by Bernarda Caffey, MD on 03/26/2022  4:04 PM.     Referring physician: Kennieth Rad, [place] [address] [place] [address]  HISTORICAL INFORMATION:   Selected notes from the MEDICAL RECORD NUMBER Referred by Dr. Monna Fam for concern of CRVO   CURRENT MEDICATIONS: No current outpatient medications on file. (Ophthalmic Drugs)   No current facility-administered medications for this visit. (Ophthalmic Drugs)   Current Outpatient Medications (Other)  Medication Sig   atorvastatin (LIPITOR) 10 MG tablet Take 10 mg by mouth daily.   blood glucose meter kit and supplies KIT Dispense based on patient and insurance preference. Use up to four times daily as directed. (FOR ICD-10 E11.65).   Blood Glucose Monitoring Suppl (ACCU-CHEK GUIDE) w/Device KIT 1 Piece by Does not apply route as directed.   buPROPion (WELLBUTRIN XL) 300 MG 24 hr tablet Take 300 mg by mouth daily.   cephALEXin (KEFLEX) 500 MG capsule Take 500 mg by mouth 2 (two) times daily.   cyanocobalamin 1000 MCG tablet Take by mouth.   donepezil (ARICEPT) 5 MG tablet Take by mouth.   doxycycline (ADOXA) 100 MG tablet Take 100 mg by mouth 2 (two) times daily. for 10 days   Dulaglutide (TRULICITY) 1.5 MB/8.4YK SOPN Inject into the skin once a week.   escitalopram  (LEXAPRO) 20 MG tablet Take by mouth.   fexofenadine (ALLEGRA) 180 MG tablet Take 180 mg by mouth daily.   FLUoxetine (PROZAC) 20 MG tablet daily.   FLUoxetine (PROZAC) 40 MG capsule 1 capsule DAILY (route: oral)   glipiZIDE (GLUCOTROL) 10 MG tablet Take 5 mg by mouth 2 (two) times daily with a meal.   glucose blood (ACCU-CHEK GUIDE) test strip Use as instructed   glucose blood test strip 1 each by Other route as needed. Use as instructed 4 x daily. E11.65. One touch ultra   hydrOXYzine (ATARAX/VISTARIL) 25 MG tablet Take 25 mg by mouth 2 (two) times daily.   insulin degludec (TRESIBA FLEXTOUCH) 100 UNIT/ML SOPN FlexTouch Pen Inject 0.2 mLs (20 Units total) into the skin daily.   insulin glargine, 2 Unit Dial, (TOUJEO MAX SOLOSTAR) 300 UNIT/ML Solostar Pen Inject into the skin.   Insulin Glargine-Lixisenatide 100-33 UNT-MCG/ML SOPN Inject into the skin.   insulin lispro (HUMALOG) 100 UNIT/ML KwikPen Inject into the skin.   Insulin Pen Needle (BD PEN NEEDLE NANO U/F) 32G X 4 MM MISC 1 each by Does not apply route at bedtime.   linagliptin (TRADJENTA) 5 MG TABS tablet Take 1 tablet (5 mg total) by mouth daily.   lisinopril (PRINIVIL,ZESTRIL) 2.5 MG tablet Take 2.5 mg by mouth daily.   metoCLOPramide (REGLAN) 10  MG tablet Take by mouth.   Misc. [facility] Bed Extension piece.   pantoprazole (PROTONIX) 40 MG tablet Take by mouth.   pioglitazone (ACTOS) 30 MG tablet Take by mouth.   lansoprazole (PREVACID) 30 MG capsule Take by mouth.   No current facility-administered medications for this visit. (Other)   REVIEW OF SYSTEMS: ROS   Positive for: Neurological, Genitourinary, Endocrine, Cardiovascular, Eyes Negative for: Constitutional, Gastrointestinal, Skin, Musculoskeletal, HENT, Respiratory, Psychiatric, Allergic/Imm, Heme/Lymph Last edited by Kingsley Spittle, COT on 03/26/2022  2:00 PM.     ALLERGIES No Known Allergies  PAST MEDICAL HISTORY Past Medical History:   Diagnosis Date   Diabetes ([place])    Diabetic retinopathy ([place])    NPDR OU   Hypertension    Hypertensive retinopathy    OU   Past Surgical History:  Procedure Laterality Date   CATARACT EXTRACTION Bilateral    Dr. Herbert Deaner   EYE SURGERY Bilateral    Cat Sx - Dr. Herbert Deaner   FAMILY HISTORY Family History  Problem Relation Age of Onset   Diabetes Father    Diabetes Sister    Diabetes Brother    SOCIAL HISTORY Social History   Tobacco Use   Smoking status: Never   Smokeless tobacco: Never  Vaping Use   Vaping Use: Never used  Substance Use Topics   Alcohol use: Never   Drug use: Never       OPHTHALMIC EXAM: Base Eye Exam     Visual Acuity (Snellen - Linear)       Right Left   Dist [place] 20/20 -2 20/20 -2         Tonometry (Tonopen, 2:06 PM)       Right Left   Pressure 15 17         Pupils       Pupils Dark Light Shape React APD   Right PERRL 3 2 Round Brisk None   Left PERRL 3 2 Round Brisk None         Visual Fields (Counting fingers)       Left Right    Full Full         Extraocular Movement       Right Left    Full, Ortho Full, Ortho         Neuro/Psych     Oriented x3: Yes   Mood/Affect: Normal         Dilation     Both eyes: 1.0% Mydriacyl, 2.5% Phenylephrine @ 2:07 PM           Slit Lamp and Fundus Exam     Slit Lamp Exam       Right Left   Lids/Lashes Dermatochalasis - upper lid Dermatochalasis - upper lid   Conjunctiva/Sclera White and quiet White and quiet   Cornea 2+ Punctate epithelial erosions, well healed temporal cataract wounds trace Punctate epithelial erosions, mild arcus, well healed temporal cataract wounds   Anterior Chamber deep, clear, narrow temporal angle Deep and quiet   Iris Round and dilated, No NVI Round and dilated, No NVI   Lens PC IOL in good position PC IOL in good position   Anterior Vitreous Vitreous syneresis Vitreous syneresis         Fundus Exam       Right Left   Disc mild  Pallor, Sharp rim, no NVD Pink and Sharp   C/D Ratio 0.2 0.3   Macula good foveal reflex, scattered MA/DBH/CWS -- improved, +edema greatest  SN - improved, punctate exudates - improving, almost resolved good foveal reflex, scattered IRH -- improved, +cystic changes SN mac -- improved, +CWS, focal punctate exudate nasal mac -- improved   Vessels attenuated, Tortuous attenuated, Tortuous   Periphery Attached, scattered IRH/DBH/CWS greatest posteriorly - improved Attached, scattered IRH/DBH/CWS greatest nasal to disc           IMAGING AND PROCEDURES  Imaging and Procedures for 03/26/2022  OCT, Retina - OU - Both Eyes       Right Eye Quality was good. Central Foveal Thickness: 238. Progression has improved. Findings include normal foveal contour, no SRF, intraretinal hyper-reflective material, intraretinal fluid, vitreomacular adhesion (interval improvement in IRF/edema SN macula and fovea, no central cysts).   Left Eye Quality was good. Central Foveal Thickness: 259. Progression has improved. Findings include normal foveal contour, no SRF, intraretinal hyper-reflective material, intraretinal fluid, vitreomacular adhesion (Persistent IRF SN macula -- slightly improved).   Notes *Images captured and stored on drive  Diagnosis / Impression:  DME OU OD: Mild interval improvement in IRF SN macula and fovea OS: Persistent IRF SN macula -- slightly improved  Clinical management:  See below  Abbreviations: NFP - Normal foveal profile. CME - cystoid macular edema. PED - pigment epithelial detachment. IRF - intraretinal fluid. SRF - subretinal fluid. EZ - ellipsoid zone. ERM - epiretinal membrane. ORA - outer retinal atrophy. ORT - outer retinal tubulation. SRHM - subretinal hyper-reflective material. IRHM - intraretinal hyper-reflective material      Intravitreal Injection, Pharmacologic Agent - OD - Right Eye       Time Out 03/26/2022. 2:49 PM. Confirmed correct patient, procedure, site,  and patient consented.   Anesthesia Topical anesthesia was used. Anesthetic medications included Lidocaine 2%, Proparacaine 0.5%.   Procedure Preparation included 5% betadine to ocular surface, eyelid speculum. A (32g) needle was used.   Injection: 2 mg aflibercept 2 MG/0.05ML   Route: Intravitreal, Site: Right Eye   NDC: A3590391, Lot: [id number], Expiration date: 05/23/2023, Waste: 0 mL   Post-op Post injection exam found visual acuity of at least counting fingers. The patient tolerated the procedure well. There were no complications. The patient received written and verbal post procedure care education. Post injection medications were not given.      Intravitreal Injection, Pharmacologic Agent - OS - Left Eye       Time Out 03/26/2022. 2:49 PM. Confirmed correct patient, procedure, site, and patient consented.   Anesthesia Topical anesthesia was used. Anesthetic medications included Lidocaine 2%, Proparacaine 0.5%.   Procedure Preparation included 5% betadine to ocular surface, eyelid speculum. A (32g) needle was used.   Injection: 2 mg aflibercept 2 MG/0.05ML   Route: Intravitreal, Site: Left Eye   NDC: A3590391, Lot: [id number], Expiration date: 05/23/2023, Waste: 0 mL   Post-op Post injection exam found visual acuity of at least counting fingers. The patient tolerated the procedure well. There were no complications. The patient received written and verbal post procedure care education. Post injection medications were not given.            ASSESSMENT/PLAN:    ICD-10-CM   1. 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 Branch retinal vein  occlusion of right eye with macular edema  H34.8310     3. Essential hypertension  I10     4. Hypertensive retinopathy of both eyes  H35.033     5. Pseudophakia, both eyes  Z96.1      1. Moderate non-proliferative diabetic retinopathy, OU (OD>OS)  - last A1c was 8.7 on 08.03.23  - s/p IVA OD #1 (12.31.21), #2 (02.04.22), #3 (03.04.22), #4 (04.01.22), #5 (05.05.22), #6 (07.15.22), #7 (11.09.22), #8 (12.21.22), #9 (02.01.23), #10 (03.01.23), #11 (04.05.23), #12 (05.10.23), #13 (06.14.23)  - s/p IVA OS #1 (01.04.22)  - s/p IVA OU #2 (02.04.22), #3 (03.04.22), #4 (04.01.22),#5 (05.05.22), #6 (07.15.22), #7 (11.09.22), #8 (12.21.22), #9 (02.01.23), #10 (03.01.23), #11 (04.05.23), #12 (05.10.23)  **IVA resistance OU**  - s/p IVE OS #1 (06.14.23), #2 (07.12.23), #3 (08.09.23), #4 (09.12.23), #5 (10.17.23), #6 (11.21.23)  - s/p IVE OD #1 (07.12.23), #2 (08.09.23), #3 (09.12.23), #4 (10.17.23), #5 (11.21.23) - exam shows scattered MA/DBH OU, central edema OU improving - BCVA 20/20 OU  - OCT shows OD: Mild interval improvement in IRF SN macula and fovea; OS: Persistent IRF SN macula at 6 weeks - recommend IVE OD #6 and IVE OS #7 today, 1.02.24 w/ f/u ext to 6-7 wks - pt wishes to proceed w/ injections  - RBA of procedure discussed, questions answered  - Avastin informed consent obtained and re-signed on 05.10.23 (OU)  - Eylea informed consent obtained and signed on 06.16.23 (OU) - see procedure note - f/u in 6-7 weeks, DFE, OCT, possible injections  2. BRVO w/ CME OD  - s/p IVA OD x13 and IVE as above - BCVA 20/25 - OCT shows interval improvement in IRF/IRHM SN macula and fovea - recommend IVE OD today as above - f/u in 5 wks-- DFE/OCT/possible injection  3,4. Hypertensive retinopathy OU - discussed importance of tight BP control -monitor  5. Pseudophakia OU  - s/p CE/IOL OU (Dr. Herbert Deaner)  - IOLs in good position, doing well - monitor  Ophthalmic Meds Ordered this visit:  Meds  ordered this encounter  Medications   aflibercept (EYLEA) SOLN 2 mg   aflibercept (EYLEA) SOLN 2 mg     Return for f/u 6-7 weeks, NPDR OU, DFE, OCT.  There are no Patient Instructions on file for this visit.  Explained the diagnoses, plan, and follow up with the patient and they expressed understanding.  Patient expressed understanding of the importance of proper follow up care.   This document serves as a record of services personally performed by Gardiner Sleeper, MD, PhD. It was created on their behalf by Renaldo Reel, [place] an ophthalmic technician. The creation of this record is the provider's dictation and/or activities during the visit.    Electronically signed by:  Renaldo Reel, COT  12.20.23 4:06 PM  This document serves as a record of services personally performed by Gardiner Sleeper, MD, PhD. It was created on their behalf by San Jetty. Owens Shark, OA an ophthalmic technician. The creation of this record is the provider's dictation and/or activities during the visit.    Electronically signed by: San Jetty. Owens Shark, [place] 01.02.2023 4:06 PM   Gardiner Sleeper, M.D., Ph.D. Diseases & Surgery of the Retina and Vitreous [facility]  I have reviewed the above documentation for accuracy and completeness, and I agree with the above. Gardiner Sleeper, M.D., Ph.D. 03/26/22 4:08 PM   Abbreviations: M myopia (nearsighted); A astigmatism; H hyperopia (farsighted); P  presbyopia; Mrx spectacle prescription;  CTL contact lenses; OD right eye; OS left eye; OU both eyes  XT exotropia; ET esotropia; PEK punctate epithelial keratitis; PEE punctate epithelial erosions; DES dry eye syndrome; MGD meibomian gland dysfunction; ATs artificial tears; PFAT's preservative free artificial tears; [place] nuclear sclerotic cataract; PSC posterior subcapsular cataract; ERM epi-retinal membrane; PVD posterior vitreous detachment; RD retinal detachment; DM diabetes mellitus; DR diabetic  retinopathy; NPDR non-proliferative diabetic retinopathy; PDR proliferative diabetic retinopathy; CSME clinically significant macular edema; DME diabetic macular edema; dbh dot blot hemorrhages; CWS cotton wool spot; POAG primary open angle glaucoma; C/D cup-to-disc ratio; HVF humphrey visual field; GVF goldmann visual field; OCT optical coherence tomography; IOP intraocular pressure; BRVO Branch retinal vein occlusion; CRVO central retinal vein occlusion; CRAO central retinal artery occlusion; BRAO branch retinal artery occlusion; RT retinal tear; SB scleral buckle; PPV pars plana vitrectomy; VH Vitreous hemorrhage; PRP panretinal laser photocoagulation; IVK intravitreal kenalog; VMT vitreomacular traction; MH Macular hole;  NVD neovascularization of the disc; NVE neovascularization elsewhere; AREDS age related eye disease study; ARMD age related macular degeneration; POAG primary open angle glaucoma; EBMD epithelial/anterior basement membrane dystrophy; ACIOL anterior chamber intraocular lens; IOL intraocular lens; PCIOL posterior chamber intraocular lens; Phaco/IOL phacoemulsification with intraocular lens placement; [place] photorefractive keratectomy; LASIK laser assisted in situ keratomileusis; HTN hypertension; DM diabetes mellitus; COPD chronic obstructive pulmonary disease

## 2022-03-26 ENCOUNTER — Encounter (INDEPENDENT_AMBULATORY_CARE_PROVIDER_SITE_OTHER): Payer: Self-pay | Admitting: Ophthalmology

## 2022-03-26 ENCOUNTER — Ambulatory Visit (INDEPENDENT_AMBULATORY_CARE_PROVIDER_SITE_OTHER): Payer: Medicare Other | Admitting: Ophthalmology

## 2022-03-26 DIAGNOSIS — Z961 Presence of intraocular lens: Secondary | ICD-10-CM

## 2022-03-26 DIAGNOSIS — H34831 Tributary (branch) retinal vein occlusion, right eye, with macular edema: Secondary | ICD-10-CM | POA: Diagnosis not present

## 2022-03-26 DIAGNOSIS — E113313 Type 2 diabetes mellitus with moderate nonproliferative diabetic retinopathy with macular edema, bilateral: Secondary | ICD-10-CM

## 2022-03-26 DIAGNOSIS — H35033 Hypertensive retinopathy, bilateral: Secondary | ICD-10-CM

## 2022-03-26 DIAGNOSIS — I10 Essential (primary) hypertension: Secondary | ICD-10-CM

## 2022-03-26 MED ORDER — AFLIBERCEPT 2MG/0.05ML IZ SOLN FOR KALEIDOSCOPE
2.0000 mg | INTRAVITREAL | Status: AC | PRN
  Administered 2022-03-26: 2 mg via INTRAVITREAL

## 2022-04-30 NOTE — Progress Notes (Signed)
[facility] Note  05/14/2022     CHIEF COMPLAINT Patient presents for Retina Follow Up   HISTORY OF PRESENT ILLNESS: Thomas Finley is a 67 y.o. male who presents to the clinic today for:   HPI     Retina Follow Up   Patient presents with  Diabetic Retinopathy.  In both eyes.  This started 7 weeks ago.  I, the attending physician,  performed the HPI with the patient and updated documentation appropriately.        Comments   Patient here for 7 weeks retina follow up for NPDR OU. Patient states vision doing fine. No eye pain.       Last edited by Bernarda Caffey, MD on 05/14/2022  3:56 PM.    Pts friend states his blood sugar has been spiking at night - sometimes around 400  Referring physician: Kennieth Rad, MD [address] [place] [address]  HISTORICAL INFORMATION:   Selected notes from the MEDICAL RECORD NUMBER Referred by Dr. Monna Fam for concern of CRVO   CURRENT MEDICATIONS: No current outpatient medications on file. (Ophthalmic Drugs)   No current facility-administered medications for this visit. (Ophthalmic Drugs)   Current Outpatient Medications (Other)  Medication Sig   atorvastatin (LIPITOR) 10 MG tablet Take 10 mg by mouth daily.   blood glucose meter kit and supplies KIT Dispense based on patient and insurance preference. Use up to four times daily as directed. (FOR ICD-10 E11.65).   Blood Glucose Monitoring Suppl (ACCU-CHEK GUIDE) w/Device KIT 1 Piece by Does not apply route as directed.   buPROPion (WELLBUTRIN XL) 300 MG 24 hr tablet Take 300 mg by mouth daily.   cephALEXin (KEFLEX) 500 MG capsule Take 500 mg by mouth 2 (two) times daily.   cyanocobalamin 1000 MCG tablet Take by mouth.   donepezil (ARICEPT) 5 MG tablet Take by mouth.   doxycycline (ADOXA) 100 MG tablet Take 100 mg by mouth 2 (two) times daily. for 10 days   Dulaglutide (TRULICITY) 1.5 0000000 SOPN Inject into the skin once a week.    escitalopram (LEXAPRO) 20 MG tablet Take by mouth.   fexofenadine (ALLEGRA) 180 MG tablet Take 180 mg by mouth daily.   FLUoxetine (PROZAC) 20 MG tablet daily.   FLUoxetine (PROZAC) 40 MG capsule 1 capsule DAILY (route: oral)   glipiZIDE (GLUCOTROL) 10 MG tablet Take 5 mg by mouth 2 (two) times daily with a meal.   glucose blood (ACCU-CHEK GUIDE) test strip Use as instructed   glucose blood test strip 1 each by Other route as needed. Use as instructed 4 x daily. E11.65. One touch ultra   hydrOXYzine (ATARAX/VISTARIL) 25 MG tablet Take 25 mg by mouth 2 (two) times daily.   insulin degludec (TRESIBA FLEXTOUCH) 100 UNIT/ML SOPN FlexTouch Pen Inject 0.2 mLs (20 Units total) into the skin daily.   insulin glargine, 2 Unit Dial, (TOUJEO MAX SOLOSTAR) 300 UNIT/ML Solostar Pen Inject into the skin.   Insulin Glargine-Lixisenatide 100-33 UNT-MCG/ML SOPN Inject into the skin.   insulin lispro (HUMALOG) 100 UNIT/ML KwikPen Inject into the skin.   Insulin Pen Needle (BD PEN NEEDLE NANO U/F) 32G X 4 MM MISC 1 each by Does not apply route at bedtime.   linagliptin (TRADJENTA) 5 MG TABS tablet Take 1 tablet (5 mg total) by mouth daily.   lisinopril (PRINIVIL,ZESTRIL) 2.5 MG tablet Take 2.5 mg by mouth daily.   metoCLOPramide (REGLAN) 10 MG tablet Take by  mouth.   Misc. [facility] Bed Extension piece.   pantoprazole (PROTONIX) 40 MG tablet Take by mouth.   pioglitazone (ACTOS) 30 MG tablet Take by mouth.   lansoprazole (PREVACID) 30 MG capsule Take by mouth.   No current facility-administered medications for this visit. (Other)   REVIEW OF SYSTEMS: ROS   Positive for: Neurological, Genitourinary, Endocrine, Cardiovascular, Eyes Negative for: Constitutional, Gastrointestinal, Skin, Musculoskeletal, HENT, Respiratory, Psychiatric, Allergic/Imm, Heme/Lymph Last edited by Theodore Demark, COA on 05/14/2022  2:02 PM.     ALLERGIES No Known Allergies  PAST MEDICAL HISTORY Past Medical  History:  Diagnosis Date   Diabetes ([place])    Diabetic retinopathy ([place])    NPDR OU   Hypertension    Hypertensive retinopathy    OU   Past Surgical History:  Procedure Laterality Date   CATARACT EXTRACTION Bilateral    Dr. Herbert Deaner   EYE SURGERY Bilateral    Cat Sx - Dr. Herbert Deaner   FAMILY HISTORY Family History  Problem Relation Age of Onset   Diabetes Father    Diabetes Sister    Diabetes Brother    SOCIAL HISTORY Social History   Tobacco Use   Smoking status: Never   Smokeless tobacco: Never  Vaping Use   Vaping Use: Never used  Substance Use Topics   Alcohol use: Never   Drug use: Never       OPHTHALMIC EXAM: Base Eye Exam     Visual Acuity (Snellen - Linear)       Right Left   Dist [place] 20/25 +1 20/20   Dist ph [place] NI          Tonometry (Tonopen, 2:00 PM)       Right Left   Pressure 17 21         Pupils       Dark Light Shape React APD   Right 3 2 Round Brisk None   Left 3 2 Round Brisk None         Visual Fields (Counting fingers)       Left Right    Full Full         Extraocular Movement       Right Left    Full, Ortho Full, Ortho         Neuro/Psych     Oriented x3: Yes   Mood/Affect: Normal         Dilation     Both eyes: 1.0% Mydriacyl, 2.5% Phenylephrine @ 2:00 PM           Slit Lamp and Fundus Exam     Slit Lamp Exam       Right Left   Lids/Lashes Dermatochalasis - upper lid Dermatochalasis - upper lid   Conjunctiva/Sclera White and quiet White and quiet   Cornea 2+ Punctate epithelial erosions, well healed temporal cataract wounds trace Punctate epithelial erosions, mild arcus, well healed temporal cataract wounds   Anterior Chamber deep, clear, narrow temporal angle Deep and quiet   Iris Round and dilated, No NVI Round and dilated, No NVI   Lens PC IOL in good position PC IOL in good position   Anterior Vitreous Vitreous syneresis Vitreous syneresis         Fundus Exam       Right Left   Disc  mild Pallor, Sharp rim, no NVD Pink and Sharp   C/D Ratio 0.2 0.3   Macula good foveal reflex, scattered MA/DBH/CWS -- improved, +edema greatest SN -  improved, no exudate good foveal reflex, scattered IRH -- improved, +cystic changes SN mac -- improved, +CWS, no exudate   Vessels attenuated, Tortuous attenuated, Tortuous   Periphery Attached, scattered IRH/DBH/CWS greatest posteriorly - improved Attached, scattered IRH/DBH/CWS greatest nasal to disc           IMAGING AND PROCEDURES  Imaging and Procedures for 05/14/2022  OCT, Retina - OU - Both Eyes       Right Eye Quality was good. Central Foveal Thickness: 239. Progression has improved. Findings include normal foveal contour, no SRF, intraretinal hyper-reflective material, intraretinal fluid, outer retinal atrophy, vitreomacular adhesion (interval improvement in IRF/edema nasal and temporal macula, no central cysts, mildly decreased ellipsoid signal centrally ).   Left Eye Quality was good. Central Foveal Thickness: 259. Progression has improved. Findings include normal foveal contour, no SRF, intraretinal hyper-reflective material, intraretinal fluid, vitreomacular adhesion (Interval improvement in IRF/IRHM nasal macula -- just trace cystic changes remain).   Notes *Images captured and stored on drive  Diagnosis / Impression:  DME OU OD: interval improvement in IRF/edema nasal and temporal macula, no central cysts, mildly decreased ellipsoid signal centrally OS: Persistent IRF SN macula -- slightly improved  Clinical management:  See below  Abbreviations: NFP - Normal foveal profile. CME - cystoid macular edema. PED - pigment epithelial detachment. IRF - intraretinal fluid. SRF - subretinal fluid. EZ - ellipsoid zone. ERM - epiretinal membrane. ORA - outer retinal atrophy. ORT - outer retinal tubulation. SRHM - subretinal hyper-reflective material. IRHM - intraretinal hyper-reflective material      Intravitreal Injection,  Pharmacologic Agent - OD - Right Eye       Time Out 05/14/2022. 2:48 PM. Confirmed correct patient, procedure, site, and patient consented.   Anesthesia Topical anesthesia was used. Anesthetic medications included Lidocaine 2%, Proparacaine 0.5%.   Procedure Preparation included 5% betadine to ocular surface, eyelid speculum. A (32g) needle was used.   Injection: 2 mg aflibercept 2 MG/0.05ML   Route: Intravitreal, Site: Right Eye   NDC: O5083423, Lot: [id number], Expiration date: 02/22/2023, Waste: 0 mL   Post-op Post injection exam found visual acuity of at least counting fingers. The patient tolerated the procedure well. There were no complications. The patient received written and verbal post procedure care education. Post injection medications were not given.      Intravitreal Injection, Pharmacologic Agent - OS - Left Eye       Time Out 05/14/2022. 2:48 PM. Confirmed correct patient, procedure, site, and patient consented.   Anesthesia Topical anesthesia was used. Anesthetic medications included Lidocaine 2%, Proparacaine 0.5%.   Procedure Preparation included 5% betadine to ocular surface, eyelid speculum. A (32g) needle was used.   Injection: 2 mg aflibercept 2 MG/0.05ML   Route: Intravitreal, Site: Left Eye   NDC: O5083423, Lot: [id number], Expiration date: 05/23/2023, Waste: 0 mL   Post-op Post injection exam found visual acuity of at least counting fingers. The patient tolerated the procedure well. There were no complications. The patient received written and verbal post procedure care education. Post injection medications were not given.            ASSESSMENT/PLAN:    ICD-10-CM   1. 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 Branch retinal vein occlusion of right eye with macular edema  H34.8310     3. Essential hypertension  I10     4. Hypertensive retinopathy of both eyes  H35.033     5. Pseudophakia, both eyes  Z96.1      1. Moderate non-proliferative diabetic retinopathy, OU (OD>OS)  - last A1c was 8.7 on 08.03.23  - s/p IVA OD #1 (12.31.21), #2 (02.04.22), #3 (03.04.22), #4 (04.01.22), #5 (05.05.22), #6 (07.15.22), #7 (11.09.22), #8 (12.21.22), #9 (02.01.23), #10 (03.01.23), #11 (04.05.23), #12 (05.10.23), #13 (06.14.23)  - s/p IVA OS #1 (01.04.22)  - s/p IVA OU #2 (02.04.22), #3 (03.04.22), #4 (04.01.22),#5 (05.05.22), #6 (07.15.22), #7 (11.09.22), #8 (12.21.22), #9 (02.01.23), #10 (03.01.23), #11 (04.05.23), #12 (05.10.23)  **IVA resistance OU**  - s/p IVE OS #1 (06.14.23), #2 (07.12.23), #3 (08.09.23), #4 (09.12.23), #5 (10.17.23), #6 (11.21.23), #7 (01.02.24)  - s/p IVE OD #1 (07.12.23), #2 (08.09.23), #3 (09.12.23), #4 (10.17.23), #5 (11.21.23), #6 (01.02.24) - exam shows scattered MA/DBH OU, central edema OU improving - BCVA 20/20 OU  - OCT shows OD: interval improvement in IRF/edema nasal and temporal macula, no central cysts, mildly decreased ellipsoid signal centrally; OS: Persistent IRF SN macula -- slightly improved at 7 weeks - recommend IVE OD #7 and IVE OS #8 today, 02.20.24 w/ f/u at 7 wks - pt wishes to proceed w/ injections  - RBA of procedure discussed, questions answered  - Avastin informed consent obtained and re-signed on 05.10.23 (OU)  - Eylea informed consent obtained and signed on 06.16.23 (OU) - see procedure note - f/u in 7 weeks, DFE, OCT, possible injections  2. BRVO w/ CME OD  - s/p IVA OD x13 and IVE as above - BCVA 20/25 - OCT shows interval improvement in IRF/IRHM SN macula and fovea - recommend IVE OD today as above - f/u in 7 wks-- DFE/OCT/possible injection  3,4.  Hypertensive retinopathy OU - discussed importance of tight BP control -monitor  5. Pseudophakia OU  - s/p CE/IOL OU (Dr. Herbert Deaner)  - IOLs in good position, doing well - monitor  Ophthalmic Meds Ordered this visit:  Meds ordered this encounter  Medications   aflibercept (EYLEA) SOLN 2 mg   aflibercept (EYLEA) SOLN 2 mg     Return in about 7 weeks (around 07/02/2022) for f/u NPDR OU, DFE, OCT.  There are no Patient Instructions on file for this visit.  Explained the diagnoses, plan, and follow up with the patient and they expressed understanding.  Patient expressed understanding of the importance of proper follow up care.   This document serves as a record of services personally performed by Gardiner Sleeper, MD, PhD. It was created on their behalf by Renaldo Reel, [place] an ophthalmic technician. The creation of this record is the provider's dictation and/or activities during the visit.    Electronically signed by:  Renaldo Reel, COT  02.06.24 3:58 PM  This document serves as a record of services personally performed by Gardiner Sleeper, MD, PhD. It was created on their behalf by San Jetty. Owens Shark, OA an ophthalmic technician. The creation of this record is the provider's dictation and/or activities during the visit.    Electronically signed by: San Jetty. Owens Shark, [place] 02.20.2024 3:58 PM  Gardiner Sleeper, M.D., Ph.D. Diseases & Surgery of the Retina and Vitreous [facility]  I have reviewed the above documentation for  accuracy and completeness, and I agree with the above. Gardiner Sleeper, M.D., Ph.D. 05/14/22 4:03 PM   Abbreviations: M myopia (nearsighted); A astigmatism; H hyperopia (farsighted); P presbyopia; Mrx spectacle prescription;  CTL contact lenses; OD right eye; OS left eye; OU both eyes  XT exotropia; ET esotropia; PEK punctate epithelial keratitis; PEE punctate epithelial erosions; DES dry eye syndrome; MGD meibomian gland dysfunction; ATs  artificial tears; PFAT's preservative free artificial tears; [place] nuclear sclerotic cataract; PSC posterior subcapsular cataract; ERM epi-retinal membrane; PVD posterior vitreous detachment; RD retinal detachment; DM diabetes mellitus; DR diabetic retinopathy; NPDR non-proliferative diabetic retinopathy; PDR proliferative diabetic retinopathy; CSME clinically significant macular edema; DME diabetic macular edema; dbh dot blot hemorrhages; CWS cotton wool spot; POAG primary open angle glaucoma; C/D cup-to-disc ratio; HVF humphrey visual field; GVF goldmann visual field; OCT optical coherence tomography; IOP intraocular pressure; BRVO Branch retinal vein occlusion; CRVO central retinal vein occlusion; CRAO central retinal artery occlusion; BRAO branch retinal artery occlusion; RT retinal tear; SB scleral buckle; PPV pars plana vitrectomy; VH Vitreous hemorrhage; PRP panretinal laser photocoagulation; IVK intravitreal kenalog; VMT vitreomacular traction; MH Macular hole;  NVD neovascularization of the disc; NVE neovascularization elsewhere; AREDS age related eye disease study; ARMD age related macular degeneration; POAG primary open angle glaucoma; EBMD epithelial/anterior basement membrane dystrophy; ACIOL anterior chamber intraocular lens; IOL intraocular lens; PCIOL posterior chamber intraocular lens; Phaco/IOL phacoemulsification with intraocular lens placement; [place] photorefractive keratectomy; LASIK laser assisted in situ keratomileusis; HTN hypertension; DM diabetes mellitus; COPD chronic obstructive pulmonary disease

## 2022-05-14 ENCOUNTER — Ambulatory Visit (INDEPENDENT_AMBULATORY_CARE_PROVIDER_SITE_OTHER): Payer: Medicare Other | Admitting: Ophthalmology

## 2022-05-14 ENCOUNTER — Encounter (INDEPENDENT_AMBULATORY_CARE_PROVIDER_SITE_OTHER): Payer: Self-pay | Admitting: Ophthalmology

## 2022-05-14 DIAGNOSIS — H34831 Tributary (branch) retinal vein occlusion, right eye, with macular edema: Secondary | ICD-10-CM | POA: Diagnosis not present

## 2022-05-14 DIAGNOSIS — H35033 Hypertensive retinopathy, bilateral: Secondary | ICD-10-CM

## 2022-05-14 DIAGNOSIS — I10 Essential (primary) hypertension: Secondary | ICD-10-CM | POA: Diagnosis not present

## 2022-05-14 DIAGNOSIS — E113313 Type 2 diabetes mellitus with moderate nonproliferative diabetic retinopathy with macular edema, bilateral: Secondary | ICD-10-CM | POA: Diagnosis not present

## 2022-05-14 DIAGNOSIS — Z961 Presence of intraocular lens: Secondary | ICD-10-CM

## 2022-05-14 MED ORDER — AFLIBERCEPT 2MG/0.05ML IZ SOLN FOR KALEIDOSCOPE
2.0000 mg | INTRAVITREAL | Status: AC | PRN
  Administered 2022-05-14: 2 mg via INTRAVITREAL

## 2022-06-18 NOTE — Progress Notes (Signed)
[facility] - Clinic Note  07/02/2022     CHIEF COMPLAINT Patient presents for Retina Follow Up   HISTORY OF PRESENT ILLNESS: Thomas Finley is a 67 y.o. male who presents to the clinic today for:   HPI     Retina Follow Up   Patient presents with  Diabetic Retinopathy.  In both eyes.  Severity is moderate.  Duration of 7 weeks.  Since onset it is stable.  I, the attending physician,  performed the HPI with the patient and updated documentation appropriately.        Comments   Pt here for 7 wk ret f/u NPDR OU. Pt states no [facility] changes. Due next week for new A1C      Last edited by Rennis ChrisZamora, Carrie Schoonmaker, MD on 07/02/2022  4:36 PM.    Pts blood sugars are still running high (around 200-300), pt has an appt with his endocrinologist next week   Referring physician: Aggie Cosieresai, Balaji, MD [address] [place],  [place] [phone]  HISTORICAL INFORMATION:   Selected notes from the MEDICAL RECORD NUMBER Referred by Dr. Mateo FlowKathryn Hecker for concern of CRVO   CURRENT MEDICATIONS: No current outpatient medications on file. (Ophthalmic Drugs)   No current facility-administered medications for this visit. (Ophthalmic Drugs)   Current Outpatient Medications (Other)  Medication Sig   atorvastatin (LIPITOR) 10 MG tablet Take 10 mg by mouth daily.   blood glucose meter kit and supplies KIT Dispense based on patient and insurance preference. Use up to four times daily as directed. (FOR ICD-10 E11.65).   Blood Glucose Monitoring Suppl (ACCU-CHEK GUIDE) w/Device KIT 1 Piece by Does not apply route as directed.   buPROPion (WELLBUTRIN XL) 300 MG 24 hr tablet Take 300 mg by mouth daily.   cephALEXin (KEFLEX) 500 MG capsule Take 500 mg by mouth 2 (two) times daily.   cyanocobalamin 1000 MCG tablet Take by mouth.   donepezil (ARICEPT) 5 MG tablet Take by mouth.   doxycycline (ADOXA) 100 MG tablet Take 100 mg by mouth 2 (two) times daily. for 10 days   Dulaglutide (TRULICITY) 1.5  MG/0.5ML SOPN Inject into the skin once a week.   escitalopram (LEXAPRO) 20 MG tablet Take by mouth.   fexofenadine (ALLEGRA) 180 MG tablet Take 180 mg by mouth daily.   FLUoxetine (PROZAC) 20 MG tablet daily.   FLUoxetine (PROZAC) 40 MG capsule 1 capsule DAILY (route: oral)   glipiZIDE (GLUCOTROL) 10 MG tablet Take 5 mg by mouth 2 (two) times daily with a meal.   glucose blood (ACCU-CHEK GUIDE) test strip Use as instructed   glucose blood test strip 1 each by Other route as needed. Use as instructed 4 x daily. E11.65. One touch ultra   hydrOXYzine (ATARAX/VISTARIL) 25 MG tablet Take 25 mg by mouth 2 (two) times daily.   insulin degludec (TRESIBA FLEXTOUCH) 100 UNIT/ML SOPN FlexTouch Pen Inject 0.2 mLs (20 Units total) into the skin daily.   insulin glargine, 2 Unit Dial, (TOUJEO MAX SOLOSTAR) 300 UNIT/ML Solostar Pen Inject into the skin.   Insulin Glargine-Lixisenatide 100-33 UNT-MCG/ML SOPN Inject into the skin.   insulin lispro (HUMALOG) 100 UNIT/ML KwikPen Inject into the skin.   Insulin Pen Needle (BD PEN NEEDLE NANO U/F) 32G X 4 MM MISC 1 each by Does not apply route at bedtime.   linagliptin (TRADJENTA) 5 MG TABS tablet Take 1 tablet (5 mg total) by mouth daily.   lisinopril (PRINIVIL,ZESTRIL) 2.5 MG tablet Take 2.5  mg by mouth daily.   metoCLOPramide (REGLAN) 10 MG tablet Take by mouth.   Misc. Devices [facility] Bed Extension piece.   pantoprazole (PROTONIX) 40 MG tablet Take by mouth.   pioglitazone (ACTOS) 30 MG tablet Take by mouth.   lansoprazole (PREVACID) 30 MG capsule Take by mouth.   No current facility-administered medications for this visit. (Other)   REVIEW OF SYSTEMS: ROS   Positive for: Neurological, Genitourinary, Endocrine, Cardiovascular, Eyes Negative for: Constitutional, Gastrointestinal, Skin, Musculoskeletal, HENT, Respiratory, Psychiatric, Allergic/Imm, Heme/Lymph Last edited by Thompson Grayer, COT on 07/02/2022  1:39 PM.      ALLERGIES No Known  Allergies  PAST MEDICAL HISTORY Past Medical History:  Diagnosis Date   Diabetes    Diabetic retinopathy    NPDR OU   Hypertension    Hypertensive retinopathy    OU   Past Surgical History:  Procedure Laterality Date   CATARACT EXTRACTION Bilateral    Dr. Elmer Picker   EYE SURGERY Bilateral    Cat Sx - Dr. Elmer Picker   FAMILY HISTORY Family History  Problem Relation Age of Onset   Diabetes Father    Diabetes Sister    Diabetes Brother    SOCIAL HISTORY Social History   Tobacco Use   Smoking status: Never   Smokeless tobacco: Never  Vaping Use   Vaping Use: Never used  Substance Use Topics   Alcohol use: Never   Drug use: Never       OPHTHALMIC EXAM: Base Eye Exam     Visual Acuity (Snellen - Linear)       Right Left   Dist [place] 20/30 20/25   Dist ph [place] 20/20 -2 NI         Tonometry (Tonopen, 1:44 PM)       Right Left   Pressure 18 20         Pupils       Pupils Dark Light Shape React APD   Right PERRL 3 2 Round Brisk None   Left PERRL 3 2 Round Brisk None         Visual Fields (Counting fingers)       Left Right    Full Full         Extraocular Movement       Right Left    Full, Ortho Full, Ortho         Neuro/Psych     Oriented x3: Yes   Mood/Affect: Normal         Dilation     Both eyes: 1.0% Mydriacyl, 2.5% Phenylephrine @ 1:45 PM           Slit Lamp and Fundus Exam     Slit Lamp Exam       Right Left   Lids/Lashes Dermatochalasis - upper lid Dermatochalasis - upper lid   Conjunctiva/Sclera White and quiet White and quiet   Cornea 2+ Punctate epithelial erosions, well healed temporal cataract wounds trace Punctate epithelial erosions, mild arcus, well healed temporal cataract wounds   Anterior Chamber deep, clear, narrow temporal angle Deep and quiet   Iris Round and dilated, No NVI Round and dilated, No NVI   Lens PC IOL in good position PC IOL in good position   Anterior Vitreous Vitreous syneresis Vitreous  syneresis         Fundus Exam       Right Left   Disc mild Pallor, Sharp rim, no NVD Pink and Sharp   C/D Ratio  0.2 0.3   Macula good foveal reflex, scattered MA/DBH/CWS -- improved, +edema greatest SN - improved, no exudate good foveal reflex, scattered IRH -- improved, +cystic changes SN mac -- improved, +CWS, no exudate   Vessels attenuated, Tortuous attenuated, Tortuous   Periphery Attached, scattered IRH/DBH/CWS greatest posteriorly - improved Attached, scattered IRH/DBH/CWS           IMAGING AND PROCEDURES  Imaging and Procedures for 07/02/2022  OCT, Retina - OU - Both Eyes       Right Eye Quality was good. Central Foveal Thickness: 236. Progression has improved. Findings include normal foveal contour, no SRF, intraretinal hyper-reflective material, intraretinal fluid, outer retinal atrophy, vitreomacular adhesion (Mild interval improvement in IRF/edema nasal and temporal macula, no central cysts, mildly decreased ellipsoid signal centrally ).   Left Eye Quality was good. Central Foveal Thickness: 256. Progression has improved. Findings include normal foveal contour, no SRF, intraretinal hyper-reflective material, intraretinal fluid, vitreomacular adhesion (Interval improvement in IRF/IRHM nasal macula -- just trace cystic changes remain).   Notes *Images captured and stored on drive  Diagnosis / Impression:  DME OU OD: mild interval improvement in IRF/edema nasal and temporal macula, no central cysts, mildly decreased ellipsoid signal centrally OS: Interval improvement in IRF/IRHM nasal macula -- just trace cystic changes remain  Clinical management:  See below  Abbreviations: NFP - Normal foveal profile. CME - cystoid macular edema. PED - pigment epithelial detachment. IRF - intraretinal fluid. SRF - subretinal fluid. EZ - ellipsoid zone. ERM - epiretinal membrane. ORA - outer retinal atrophy. ORT - outer retinal tubulation. SRHM - subretinal hyper-reflective material.  IRHM - intraretinal hyper-reflective material      Intravitreal Injection, Pharmacologic Agent - OD - Right Eye       Time Out 07/02/2022. 2:26 PM. Confirmed correct patient, procedure, site, and patient consented.   Anesthesia Topical anesthesia was used. Anesthetic medications included Lidocaine 2%, Proparacaine 0.5%.   Procedure Preparation included 5% betadine to ocular surface, eyelid speculum. A (32g) needle was used.   Injection: 2 mg aflibercept 2 MG/0.05ML   Route: Intravitreal, Site: Right Eye   NDC: L6038910, Lot: [id number], Expiration date: 06/23/2023, Waste: 0 mL   Post-op Post injection exam found visual acuity of at least counting fingers. The patient tolerated the procedure well. There were no complications. The patient received written and verbal post procedure care education. Post injection medications were not given.      Intravitreal Injection, Pharmacologic Agent - OS - Left Eye       Time Out 07/02/2022. 2:27 PM. Confirmed correct patient, procedure, site, and patient consented.   Anesthesia Topical anesthesia was used. Anesthetic medications included Lidocaine 2%, Proparacaine 0.5%.   Procedure Preparation included 5% betadine to ocular surface, eyelid speculum. A (32g) needle was used.   Injection: 2 mg aflibercept 2 MG/0.05ML   Route: Intravitreal, Site: Left Eye   NDC: L6038910, Lot: [id number], Expiration date: 01/23/2023, Waste: 0 mL   Post-op Post injection exam found visual acuity of at least counting fingers. The patient tolerated the procedure well. There were no complications. The patient received written and verbal post procedure care education. Post injection medications were not given.             ASSESSMENT/PLAN:    ICD-10-CM   1. Moderate nonproliferative diabetic retinopathy of both eyes with macular edema associated with type 2 diabetes mellitus  E11.3313 OCT, Retina - OU - Both Eyes    Intravitreal Injection,  Pharmacologic Agent - OD -  Right Eye    Intravitreal Injection, Pharmacologic Agent - OS - Left Eye    aflibercept (EYLEA) SOLN 2 mg    aflibercept (EYLEA) SOLN 2 mg    2. Branch retinal vein occlusion of right eye with macular edema  H34.8310     3. Essential hypertension  I10     4. Hypertensive retinopathy of both eyes  H35.033     5. Pseudophakia, both eyes  Z96.1       1. Moderate non-proliferative diabetic retinopathy, OU (OD>OS)  - last A1c was 8.7 on 08.03.23  - s/p IVA OD #1 (12.31.21), #2 (02.04.22), #3 (03.04.22), #4 (04.01.22), #5 (05.05.22), #6 (07.15.22), #7 (11.09.22), #8 (12.21.22), #9 (02.01.23), #10 (03.01.23), #11 (04.05.23), #12 (05.10.23), #13 (06.14.23)  - s/p IVA OS #1 (01.04.22)  - s/p IVA OU #2 (02.04.22), #3 (03.04.22), #4 (04.01.22),#5 (05.05.22), #6 (07.15.22), #7 (11.09.22), #8 (12.21.22), #9 (02.01.23), #10 (03.01.23), #11 (04.05.23), #12 (05.10.23)  **IVA resistance OU**  - s/p IVE OS #1 (06.14.23), #2 (07.12.23), #3 (08.09.23), #4 (09.12.23), #5 (10.17.23), #6 (11.21.23), #7 (01.02.24), #8 (02.20.24)  - s/p IVE OD #1 (07.12.23), #2 (08.09.23), #3 (09.12.23), #4 (10.17.23), #5 (11.21.23), #6 (01.02.24), #7 (02.20.24) - exam shows scattered MA/DBH OU, central edema OU improving - BCVA 20/20 OD, 20/25 OS -- stable OU  - OCT shows OD: mild interval improvement in IRF/edema nasal and temporal macula, no central cysts, mildly decreased ellipsoid signal centrally; OS: Interval improvement in IRF/IRHM nasal macula -- just trace cystic changes remain at 7 weeks - recommend IVE OD #8 and IVE OS #9 today, 04.09.24 w/ f/u ext to 8 wks - pt wishes to proceed w/ injections  - RBA of procedure discussed, questions answered  - Avastin informed consent obtained and re-signed on 05.10.23 (OU)  - Eylea informed consent obtained and signed on 06.16.23 (OU) - see procedure note - f/u in 8 weeks, DFE, OCT, possible injections  2. BRVO w/ CME OD  - s/p IVA OD x13 and IVE  as above - BCVA 20/25 - OCT shows interval improvement in IRF/IRHM SN macula and fovea - recommend IVE OD today as above - f/u in 8 wks-- DFE/OCT/possible injection  3,4. Hypertensive retinopathy OU - discussed importance of tight BP control -monitor  5. Pseudophakia OU  - s/p CE/IOL OU (Dr. Elmer PickerHecker)  - IOLs in good position, doing well - monitor  Ophthalmic Meds Ordered this visit:  Meds ordered this encounter  Medications   aflibercept (EYLEA) SOLN 2 mg   aflibercept (EYLEA) SOLN 2 mg     Return in about 8 weeks (around 08/27/2022) for f/u NPDR OU, DFE, OCT.  There are no Patient Instructions on file for this visit.  Explained the diagnoses, plan, and follow up with the patient and they expressed understanding.  Patient expressed understanding of the importance of proper follow up care.   This document serves as a record of services personally performed by Karie ChimeraBrian G. Parley Pidcock, MD, PhD. It was created on their behalf by Gerilyn Nestlehristine Shamlian, COT an ophthalmic technician. The creation of this record is the provider's dictation and/or activities during the visit.    Electronically signed by:  Gerilyn Nestlehristine Shamlian, COT  03.26.24 4:37 PM  This document serves as a record of services personally performed by Karie ChimeraBrian G. Haddon Fyfe, MD, PhD. It was created on their behalf by Glee ArvinAmanda J. Manson PasseyBrown, OA an ophthalmic technician. The creation of this record is the provider's dictation and/or activities during the visit.    Electronically signed  by: Glee Arvin Kristopher Oppenheim 04.09.2024 4:37 PM  Karie Chimera, M.D., Ph.D. Diseases & Surgery of the Retina and Vitreous [facility]  I have reviewed the above documentation for accuracy and completeness, and I agree with the above. Karie Chimera, M.D., Ph.D. 07/02/22 4:38 PM   Abbreviations: M myopia (nearsighted); A astigmatism; H hyperopia (farsighted); P presbyopia; Mrx spectacle prescription;  CTL contact lenses; OD right eye; OS left  eye; OU both eyes  XT exotropia; ET esotropia; PEK punctate epithelial keratitis; PEE punctate epithelial erosions; DES dry eye syndrome; MGD meibomian gland dysfunction; ATs artificial tears; PFAT's preservative free artificial tears; NSC nuclear sclerotic cataract; PSC posterior subcapsular cataract; ERM epi-retinal membrane; PVD posterior vitreous detachment; RD retinal detachment; DM diabetes mellitus; DR diabetic retinopathy; NPDR non-proliferative diabetic retinopathy; PDR proliferative diabetic retinopathy; CSME clinically significant macular edema; DME diabetic macular edema; dbh dot blot hemorrhages; CWS cotton wool spot; POAG primary open angle glaucoma; C/D cup-to-disc ratio; HVF humphrey visual field; GVF goldmann visual field; OCT optical coherence tomography; IOP intraocular pressure; BRVO Branch retinal vein occlusion; CRVO central retinal vein occlusion; CRAO central retinal artery occlusion; BRAO branch retinal artery occlusion; RT retinal tear; SB scleral buckle; PPV pars plana vitrectomy; VH Vitreous hemorrhage; PRP panretinal laser photocoagulation; IVK intravitreal kenalog; VMT vitreomacular traction; MH Macular hole;  NVD neovascularization of the disc; NVE neovascularization elsewhere; AREDS age related eye disease study; ARMD age related macular degeneration; POAG primary open angle glaucoma; EBMD epithelial/anterior basement membrane dystrophy; ACIOL anterior chamber intraocular lens; IOL intraocular lens; PCIOL posterior chamber intraocular lens; Phaco/IOL phacoemulsification with intraocular lens placement; PRK photorefractive keratectomy; LASIK laser assisted in situ keratomileusis; HTN hypertension; DM diabetes mellitus; COPD chronic obstructive pulmonary disease

## 2022-07-02 ENCOUNTER — Encounter (INDEPENDENT_AMBULATORY_CARE_PROVIDER_SITE_OTHER): Payer: Self-pay | Admitting: Ophthalmology

## 2022-07-02 ENCOUNTER — Ambulatory Visit (INDEPENDENT_AMBULATORY_CARE_PROVIDER_SITE_OTHER): Payer: Medicare Other | Admitting: Ophthalmology

## 2022-07-02 DIAGNOSIS — I10 Essential (primary) hypertension: Secondary | ICD-10-CM

## 2022-07-02 DIAGNOSIS — H34831 Tributary (branch) retinal vein occlusion, right eye, with macular edema: Secondary | ICD-10-CM

## 2022-07-02 DIAGNOSIS — H35033 Hypertensive retinopathy, bilateral: Secondary | ICD-10-CM

## 2022-07-02 DIAGNOSIS — Z961 Presence of intraocular lens: Secondary | ICD-10-CM

## 2022-07-02 DIAGNOSIS — E113313 Type 2 diabetes mellitus with moderate nonproliferative diabetic retinopathy with macular edema, bilateral: Secondary | ICD-10-CM | POA: Diagnosis not present

## 2022-07-02 MED ORDER — AFLIBERCEPT 2MG/0.05ML IZ SOLN FOR KALEIDOSCOPE
2.0000 mg | INTRAVITREAL | Status: AC | PRN
  Administered 2022-07-02: 2 mg via INTRAVITREAL

## 2022-08-13 NOTE — Progress Notes (Signed)
[facility] - Clinic Note  08/27/2022     CHIEF COMPLAINT Patient presents for Retina Follow Up   HISTORY OF PRESENT ILLNESS: Thomas Finley is a 67 y.o. male who presents to the clinic today for:   HPI     Retina Follow Up   Patient presents with  Diabetic Retinopathy.  In both eyes.  This started 8 weeks ago.  Duration of 8 weeks.  Since onset it is stable.  I, the attending physician,  performed the HPI with the patient and updated documentation appropriately.        Comments   8 week retina follow up NPDR OU and I'VE OU pt is reporting no vision changes noticed his last reading 124       Last edited by Rennis Chris, MD on 08/27/2022  4:34 PM.     Referring physician: Aggie Cosier, MD [address] [place],  [place] [address]  HISTORICAL INFORMATION:   Selected notes from the MEDICAL RECORD NUMBER Referred by Dr. Mateo Flow for concern of CRVO   CURRENT MEDICATIONS: No current outpatient medications on file. (Ophthalmic Drugs)   No current facility-administered medications for this visit. (Ophthalmic Drugs)   Current Outpatient Medications (Other)  Medication Sig   atorvastatin (LIPITOR) 10 MG tablet Take 10 mg by mouth daily.   blood glucose meter kit and supplies KIT Dispense based on patient and insurance preference. Use up to four times daily as directed. (FOR ICD-10 E11.65).   Blood Glucose Monitoring Suppl (ACCU-CHEK GUIDE) w/Device KIT 1 Piece by Does not apply route as directed.   buPROPion (WELLBUTRIN XL) 300 MG 24 hr tablet Take 300 mg by mouth daily.   cephALEXin (KEFLEX) 500 MG capsule Take 500 mg by mouth 2 (two) times daily.   cyanocobalamin 1000 MCG tablet Take by mouth.   donepezil (ARICEPT) 5 MG tablet Take by mouth.   doxycycline (ADOXA) 100 MG tablet Take 100 mg by mouth 2 (two) times daily. for 10 days   Dulaglutide (TRULICITY) 1.5 MG/0.5ML SOPN Inject into the skin once a week.   escitalopram (LEXAPRO) 20 MG  tablet Take by mouth.   fexofenadine (ALLEGRA) 180 MG tablet Take 180 mg by mouth daily.   FLUoxetine (PROZAC) 20 MG tablet daily.   FLUoxetine (PROZAC) 40 MG capsule 1 capsule DAILY (route: oral)   glipiZIDE (GLUCOTROL) 10 MG tablet Take 5 mg by mouth 2 (two) times daily with a meal.   glucose blood (ACCU-CHEK GUIDE) test strip Use as instructed   glucose blood test strip 1 each by Other route as needed. Use as instructed 4 x daily. E11.65. One touch ultra   hydrOXYzine (ATARAX/VISTARIL) 25 MG tablet Take 25 mg by mouth 2 (two) times daily.   insulin degludec (TRESIBA FLEXTOUCH) 100 UNIT/ML SOPN FlexTouch Pen Inject 0.2 mLs (20 Units total) into the skin daily.   insulin glargine, 2 Unit Dial, (TOUJEO MAX SOLOSTAR) 300 UNIT/ML Solostar Pen Inject into the skin.   Insulin Glargine-Lixisenatide 100-33 UNT-MCG/ML SOPN Inject into the skin.   insulin lispro (HUMALOG) 100 UNIT/ML KwikPen Inject into the skin.   Insulin Pen Needle (BD PEN NEEDLE NANO U/F) 32G X 4 MM MISC 1 each by Does not apply route at bedtime.   lansoprazole (PREVACID) 30 MG capsule Take by mouth.   linagliptin (TRADJENTA) 5 MG TABS tablet Take 1 tablet (5 mg total) by mouth daily.   lisinopril (PRINIVIL,ZESTRIL) 2.5 MG tablet Take 2.5 mg by mouth daily.  metoCLOPramide (REGLAN) 10 MG tablet Take by mouth.   Misc. Devices [facility] Bed Extension piece.   pantoprazole (PROTONIX) 40 MG tablet Take by mouth.   pioglitazone (ACTOS) 30 MG tablet Take by mouth.   No current facility-administered medications for this visit. (Other)   REVIEW OF SYSTEMS: ROS   Positive for: Neurological, Genitourinary, Endocrine, Cardiovascular, Eyes Negative for: Constitutional, Gastrointestinal, Skin, Musculoskeletal, HENT, Respiratory, Psychiatric, Allergic/Imm, Heme/Lymph Last edited by Etheleen Mayhew, COT on 08/27/2022  2:06 PM.     ALLERGIES No Known Allergies  PAST MEDICAL HISTORY Past Medical History:  Diagnosis Date    Diabetes (HCC)    Diabetic retinopathy (HCC)    NPDR OU   Hypertension    Hypertensive retinopathy    OU   Past Surgical History:  Procedure Laterality Date   CATARACT EXTRACTION Bilateral    Dr. Elmer Picker   EYE SURGERY Bilateral    Cat Sx - Dr. Elmer Picker   FAMILY HISTORY Family History  Problem Relation Age of Onset   Diabetes Father    Diabetes Sister    Diabetes Brother    SOCIAL HISTORY Social History   Tobacco Use   Smoking status: Never   Smokeless tobacco: Never  Vaping Use   Vaping Use: Never used  Substance Use Topics   Alcohol use: Never   Drug use: Never       OPHTHALMIC EXAM: Base Eye Exam     Visual Acuity (Snellen - Linear)       Right Left   Dist [place] 20/25 20/20 -1   Dist ph [place] NI          Tonometry (Tonopen, 2:09 PM)       Right Left   Pressure 18 20         Pupils       Pupils Dark Light Shape React APD   Right PERRL 3 2 Round Brisk None   Left PERRL 3 2 Round Brisk None         Visual Fields       Left Right    Full Full         Extraocular Movement       Right Left    Full, Ortho Full, Ortho         Neuro/Psych     Oriented x3: Yes   Mood/Affect: Normal         Dilation     Both eyes: 2.5% Phenylephrine @ 2:09 PM           Slit Lamp and Fundus Exam     Slit Lamp Exam       Right Left   Lids/Lashes Dermatochalasis - upper lid Dermatochalasis - upper lid   Conjunctiva/Sclera White and quiet White and quiet   Cornea 2+ Punctate epithelial erosions, well healed temporal cataract wounds trace Punctate epithelial erosions, mild arcus, well healed temporal cataract wounds   Anterior Chamber deep, clear, narrow temporal angle Deep and quiet   Iris Round and dilated, No NVI Round and dilated, No NVI   Lens PC IOL in good position PC IOL in good position   Anterior Vitreous Vitreous syneresis Vitreous syneresis         Fundus Exam       Right Left   Disc mild Pallor, Sharp rim, no NVD Pink and Sharp    C/D Ratio 0.2 0.3   Macula good foveal reflex, scattered MA/DBH/CWS -- scattered cystic changes - improved, no exudate good  foveal reflex, scattered IRH -- improved, +cystic changes SN mac -- improved, +CWS, no exudate   Vessels attenuated, Tortuous attenuated, Tortuous   Periphery Attached, scattered IRH/DBH/CWS greatest posteriorly - improved Attached, scattered IRH/DBH/CWS           IMAGING AND PROCEDURES  Imaging and Procedures for 08/27/2022  OCT, Retina - OU - Both Eyes       Right Eye Quality was good. Central Foveal Thickness: 231. Progression has improved. Findings include normal foveal contour, no SRF, intraretinal hyper-reflective material, intraretinal fluid, outer retinal atrophy, vitreomacular adhesion (Mild interval improvement in IRF/edema nasal macula, no central cysts, mildly decreased ellipsoid signal centrally ).   Left Eye Quality was good. Central Foveal Thickness: 257. Progression has improved. Findings include normal foveal contour, no SRF, intraretinal hyper-reflective material, intraretinal fluid, vitreomacular adhesion (Mild interval improvement in IRF/IRHM nasal macula -- just trace cystic changes remain).   Notes *Images captured and stored on drive  Diagnosis / Impression:  DME OU OD: mild interval improvement in IRF/edema nasal macula, no central cysts, mildly decreased ellipsoid signal centrally OS: mild interval improvement in IRF/IRHM nasal macula -- just trace cystic changes remain  Clinical management:  See below  Abbreviations: NFP - Normal foveal profile. CME - cystoid macular edema. PED - pigment epithelial detachment. IRF - intraretinal fluid. SRF - subretinal fluid. EZ - ellipsoid zone. ERM - epiretinal membrane. ORA - outer retinal atrophy. ORT - outer retinal tubulation. SRHM - subretinal hyper-reflective material. IRHM - intraretinal hyper-reflective material      Intravitreal Injection, Pharmacologic Agent - OD - Right Eye        Time Out 08/27/2022. 2:56 PM. Confirmed correct patient, procedure, site, and patient consented.   Anesthesia Topical anesthesia was used. Anesthetic medications included Lidocaine 2%, Proparacaine 0.5%.   Procedure Preparation included 5% betadine to ocular surface, eyelid speculum. A (32g) needle was used.   Injection: 2 mg aflibercept 2 MG/0.05ML   Route: Intravitreal, Site: Right Eye   NDC: L6038910, Lot: [id number], Expiration date: 07/23/2023, Waste: 0 mL   Post-op Post injection exam found visual acuity of at least counting fingers. The patient tolerated the procedure well. There were no complications. The patient received written and verbal post procedure care education. Post injection medications were not given.      Intravitreal Injection, Pharmacologic Agent - OS - Left Eye       Time Out 08/27/2022. 2:57 PM. Confirmed correct patient, procedure, site, and patient consented.   Anesthesia Topical anesthesia was used. Anesthetic medications included Lidocaine 2%, Proparacaine 0.5%.   Procedure Preparation included 5% betadine to ocular surface, eyelid speculum. A (32g) needle was used.   Injection: 2 mg aflibercept 2 MG/0.05ML   Route: Intravitreal, Site: Left Eye   NDC: L6038910, Lot: [id number], Expiration date: 07/23/2023, Waste: 0 mL   Post-op Post injection exam found visual acuity of at least counting fingers. The patient tolerated the procedure well. There were no complications. The patient received written and verbal post procedure care education. Post injection medications were not given.            ASSESSMENT/PLAN:    ICD-10-CM   1. Moderate nonproliferative diabetic retinopathy of both eyes with macular edema associated with type 2 diabetes mellitus (HCC)  E11.3313 OCT, Retina - OU - Both Eyes    Intravitreal Injection, Pharmacologic Agent - OD - Right Eye    Intravitreal Injection, Pharmacologic Agent - OS - Left Eye    aflibercept (EYLEA)  SOLN 2 mg  aflibercept (EYLEA) SOLN 2 mg    2. Current use of insulin (HCC)  Z79.4     3. Long term (current) use of oral hypoglycemic drugs  Z79.84     4. Long-term (current) use of injectable non-insulin antidiabetic drugs  Z79.85     5. Branch retinal vein occlusion of right eye with macular edema  H34.8310     6. Essential hypertension  I10     7. Hypertensive retinopathy of both eyes  H35.033     8. Pseudophakia, both eyes  Z96.1        1-4. Moderate non-proliferative diabetic retinopathy, OU (OD>OS)  - last A1c was 8.8 on 04.15.24, 8.7 on 08.03.23  - s/p IVA OD #1 (12.31.21), #2 (02.04.22), #3 (03.04.22), #4 (04.01.22), #5 (05.05.22), #6 (07.15.22), #7 (11.09.22), #8 (12.21.22), #9 (02.01.23), #10 (03.01.23), #11 (04.05.23), #12 (05.10.23), #13 (06.14.23)  - s/p IVA OS #1 (01.04.22)  - s/p IVA OU #2 (02.04.22), #3 (03.04.22), #4 (04.01.22),#5 (05.05.22), #6 (07.15.22), #7 (11.09.22), #8 (12.21.22), #9 (02.01.23), #10 (03.01.23), #11 (04.05.23), #12 (05.10.23)  **IVA resistance OU**  - s/p IVE OS #1 (06.14.23), #2 (07.12.23), #3 (08.09.23), #4 (09.12.23), #5 (10.17.23), #6 (11.21.23), #7 (01.02.24), #8 (02.20.24), #9 (04.09.24)  - s/p IVE OD #1 (07.12.23), #2 (08.09.23), #3 (09.12.23), #4 (10.17.23), #5 (11.21.23), #6 (01.02.24), #7 (02.20.24), #8 (04.09.24) - exam shows scattered MA/DBH OU, central edema OU improving - BCVA 20/25 OD, 20/20 O  - OCT shows OD: mild interval improvement in IRF/edema nasal and temporal macula, no central cysts, mildly decreased ellipsoid signal centrally; OS: Interval improvement in IRF/IRHM nasal macula -- just trace cystic changes remain at 8 weeks - recommend IVE OD #9 and IVE OS #10 today, 06.04.24 w/ f/u in 8 wks - pt wishes to proceed w/ injections  - RBA of procedure discussed, questions answered  - Avastin informed consent obtained and re-signed on 05.10.23 (OU)  - Eylea informed consent obtained and signed on 06.16.23 (OU) - see  procedure note - f/u in 8 weeks, DFE, OCT, possible injections  5. BRVO w/ CME OD  - s/p IVA OD x13 and IVE as above - BCVA 20/25 - OCT shows interval improvement in IRF/IRHM SN macula and fovea - recommend IVE OD today as above - f/u in 8 wks -- DFE/OCT/possible injection  6,7. Hypertensive retinopathy OU - discussed importance of tight BP control -monitor  8. Pseudophakia OU  - s/p CE/IOL OU (Dr. Elmer Picker)  - IOLs in good position, doing well - monitor  Ophthalmic Meds Ordered this visit:  Meds ordered this encounter  Medications   aflibercept (EYLEA) SOLN 2 mg   aflibercept (EYLEA) SOLN 2 mg     Return in about 8 weeks (around 10/22/2022) for f/u NPDR, DFE, OCT.  There are no Patient Instructions on file for this visit.  Explained the diagnoses, plan, and follow up with the patient and they expressed understanding.  Patient expressed understanding of the importance of proper follow up care.   This document serves as a record of services personally performed by Karie Chimera, MD, PhD. It was created on their behalf by Gerilyn Nestle, COT an ophthalmic technician. The creation of this record is the provider's dictation and/or activities during the visit.    Electronically signed by:  Gerilyn Nestle, COT  05.21.24 9:26 PM  This document serves as a record of services personally performed by Karie Chimera, MD, PhD. It was created on their behalf by Glee Arvin. Manson Passey, OA an  ophthalmic technician. The creation of this record is the provider's dictation and/or activities during the visit.    Electronically signed by: Glee Arvin. Manson Passey, [place] 06.04.2024 9:26 PM   Karie Chimera, M.D., Ph.D. Diseases & Surgery of the Retina and Vitreous [facility]  I have reviewed the above documentation for accuracy and completeness, and I agree with the above. Karie Chimera, M.D., Ph.D. 08/27/22 9:27 PM   Abbreviations: M myopia (nearsighted); A astigmatism; H  hyperopia (farsighted); P presbyopia; Mrx spectacle prescription;  CTL contact lenses; OD right eye; OS left eye; OU both eyes  XT exotropia; ET esotropia; PEK punctate epithelial keratitis; PEE punctate epithelial erosions; DES dry eye syndrome; MGD meibomian gland dysfunction; ATs artificial tears; PFAT's preservative free artificial tears; NSC nuclear sclerotic cataract; PSC posterior subcapsular cataract; ERM epi-retinal membrane; PVD posterior vitreous detachment; RD retinal detachment; DM diabetes mellitus; DR diabetic retinopathy; NPDR non-proliferative diabetic retinopathy; PDR proliferative diabetic retinopathy; CSME clinically significant macular edema; DME diabetic macular edema; dbh dot blot hemorrhages; CWS cotton wool spot; POAG primary open angle glaucoma; C/D cup-to-disc ratio; HVF humphrey visual field; GVF goldmann visual field; OCT optical coherence tomography; IOP intraocular pressure; BRVO Branch retinal vein occlusion; CRVO central retinal vein occlusion; CRAO central retinal artery occlusion; BRAO branch retinal artery occlusion; RT retinal tear; SB scleral buckle; PPV pars plana vitrectomy; VH Vitreous hemorrhage; PRP panretinal laser photocoagulation; IVK intravitreal kenalog; VMT vitreomacular traction; MH Macular hole;  NVD neovascularization of the disc; NVE neovascularization elsewhere; AREDS age related eye disease study; ARMD age related macular degeneration; POAG primary open angle glaucoma; EBMD epithelial/anterior basement membrane dystrophy; ACIOL anterior chamber intraocular lens; IOL intraocular lens; PCIOL posterior chamber intraocular lens; Phaco/IOL phacoemulsification with intraocular lens placement; PRK photorefractive keratectomy; LASIK laser assisted in situ keratomileusis; HTN hypertension; DM diabetes mellitus; COPD chronic obstructive pulmonary disease

## 2022-08-27 ENCOUNTER — Ambulatory Visit (INDEPENDENT_AMBULATORY_CARE_PROVIDER_SITE_OTHER): Payer: Medicare Other | Admitting: Ophthalmology

## 2022-08-27 ENCOUNTER — Encounter (INDEPENDENT_AMBULATORY_CARE_PROVIDER_SITE_OTHER): Payer: Self-pay | Admitting: Ophthalmology

## 2022-08-27 DIAGNOSIS — H35033 Hypertensive retinopathy, bilateral: Secondary | ICD-10-CM

## 2022-08-27 DIAGNOSIS — E113313 Type 2 diabetes mellitus with moderate nonproliferative diabetic retinopathy with macular edema, bilateral: Secondary | ICD-10-CM

## 2022-08-27 DIAGNOSIS — H34831 Tributary (branch) retinal vein occlusion, right eye, with macular edema: Secondary | ICD-10-CM

## 2022-08-27 DIAGNOSIS — Z961 Presence of intraocular lens: Secondary | ICD-10-CM

## 2022-08-27 DIAGNOSIS — Z7985 Long-term (current) use of injectable non-insulin antidiabetic drugs: Secondary | ICD-10-CM

## 2022-08-27 DIAGNOSIS — Z7984 Long term (current) use of oral hypoglycemic drugs: Secondary | ICD-10-CM | POA: Diagnosis not present

## 2022-08-27 DIAGNOSIS — Z794 Long term (current) use of insulin: Secondary | ICD-10-CM

## 2022-08-27 DIAGNOSIS — I10 Essential (primary) hypertension: Secondary | ICD-10-CM

## 2022-08-27 MED ORDER — AFLIBERCEPT 2MG/0.05ML IZ SOLN FOR KALEIDOSCOPE
2.0000 mg | INTRAVITREAL | Status: AC | PRN
Start: 2022-08-27 — End: 2022-08-27
  Administered 2022-08-27: 2 mg via INTRAVITREAL

## 2022-10-10 NOTE — Progress Notes (Signed)
[facility] - Clinic Note  10/22/2022     CHIEF COMPLAINT Patient presents for Retina Follow Up   HISTORY OF PRESENT ILLNESS: Thomas Finley is a 67 y.o. male who presents to the clinic today for:   HPI     Retina Follow Up   Patient presents with  Diabetic Retinopathy.  In both eyes.  This started 8 weeks ago.  Duration of 8 weeks.  Since onset it is stable.  I, the attending physician,  performed the HPI with the patient and updated documentation appropriately.        Comments   8 week retina follow up NPDR and I'VE OU pt has been having episodes of nausea  for the past few months with no cause pt is reporting no vision changes noticed last reading 100 this am pt is moving to a long term care facility next week       Last edited by Rennis Chris, MD on 10/22/2022  4:15 PM.    Pt has been having recurrent episodes of nausea and vomiting -- moving to a facility soon.  Referring physician: Aggie Cosier, MD [address] [place],  [place] [address]  HISTORICAL INFORMATION:   Selected notes from the MEDICAL RECORD NUMBER Referred by Dr. Mateo Flow for concern of CRVO   CURRENT MEDICATIONS: No current outpatient medications on file. (Ophthalmic Drugs)   No current facility-administered medications for this visit. (Ophthalmic Drugs)   Current Outpatient Medications (Other)  Medication Sig   atorvastatin (LIPITOR) 10 MG tablet Take 10 mg by mouth daily.   blood glucose meter kit and supplies KIT Dispense based on patient and insurance preference. Use up to four times daily as directed. (FOR ICD-10 E11.65).   Blood Glucose Monitoring Suppl (ACCU-CHEK GUIDE) w/Device KIT 1 Piece by Does not apply route as directed.   buPROPion (WELLBUTRIN XL) 300 MG 24 hr tablet Take 300 mg by mouth daily.   cephALEXin (KEFLEX) 500 MG capsule Take 500 mg by mouth 2 (two) times daily.   cyanocobalamin 1000 MCG tablet Take by mouth.   donepezil (ARICEPT) 5 MG  tablet Take by mouth.   doxycycline (ADOXA) 100 MG tablet Take 100 mg by mouth 2 (two) times daily. for 10 days   Dulaglutide (TRULICITY) 1.5 MG/0.5ML SOPN Inject into the skin once a week.   escitalopram (LEXAPRO) 20 MG tablet Take by mouth.   fexofenadine (ALLEGRA) 180 MG tablet Take 180 mg by mouth daily.   FLUoxetine (PROZAC) 20 MG tablet daily.   FLUoxetine (PROZAC) 40 MG capsule 1 capsule DAILY (route: oral)   glipiZIDE (GLUCOTROL) 10 MG tablet Take 5 mg by mouth 2 (two) times daily with a meal.   glucose blood (ACCU-CHEK GUIDE) test strip Use as instructed   glucose blood test strip 1 each by Other route as needed. Use as instructed 4 x daily. E11.65. One touch ultra   hydrOXYzine (ATARAX/VISTARIL) 25 MG tablet Take 25 mg by mouth 2 (two) times daily.   insulin degludec (TRESIBA FLEXTOUCH) 100 UNIT/ML SOPN FlexTouch Pen Inject 0.2 mLs (20 Units total) into the skin daily.   insulin glargine, 2 Unit Dial, (TOUJEO MAX SOLOSTAR) 300 UNIT/ML Solostar Pen Inject into the skin.   Insulin Glargine-Lixisenatide 100-33 UNT-MCG/ML SOPN Inject into the skin.   insulin lispro (HUMALOG) 100 UNIT/ML KwikPen Inject into the skin.   Insulin Pen Needle (BD PEN NEEDLE NANO U/F) 32G X 4 MM MISC 1 each by Does not apply route  at bedtime.   lansoprazole (PREVACID) 30 MG capsule Take by mouth.   linagliptin (TRADJENTA) 5 MG TABS tablet Take 1 tablet (5 mg total) by mouth daily.   lisinopril (PRINIVIL,ZESTRIL) 2.5 MG tablet Take 2.5 mg by mouth daily.   metoCLOPramide (REGLAN) 10 MG tablet Take by mouth.   Misc. Devices [facility] Bed Extension piece.   pantoprazole (PROTONIX) 40 MG tablet Take by mouth.   pioglitazone (ACTOS) 30 MG tablet Take by mouth.   No current facility-administered medications for this visit. (Other)   REVIEW OF SYSTEMS: ROS   Positive for: Neurological, Genitourinary, Endocrine, Cardiovascular, Eyes Negative for: Constitutional, Gastrointestinal, Skin, Musculoskeletal, HENT,  Respiratory, Psychiatric, Allergic/Imm, Heme/Lymph Last edited by Etheleen Mayhew, COT on 10/22/2022  2:11 PM.      ALLERGIES No Known Allergies  PAST MEDICAL HISTORY Past Medical History:  Diagnosis Date   Diabetes (HCC)    Diabetic retinopathy (HCC)    NPDR OU   Hypertension    Hypertensive retinopathy    OU   Past Surgical History:  Procedure Laterality Date   CATARACT EXTRACTION Bilateral    Dr. Elmer Picker   EYE SURGERY Bilateral    Cat Sx - Dr. Elmer Picker   FAMILY HISTORY Family History  Problem Relation Age of Onset   Diabetes Father    Diabetes Sister    Diabetes Brother    SOCIAL HISTORY Social History   Tobacco Use   Smoking status: Never   Smokeless tobacco: Never  Vaping Use   Vaping status: Never Used  Substance Use Topics   Alcohol use: Never   Drug use: Never       OPHTHALMIC EXAM: Base Eye Exam     Visual Acuity (Snellen - Linear)       Right Left   Dist Effort 20/30 -2 20/25 -1   Dist ph [place] NI NI         Tonometry (Tonopen, 2:18 PM)       Right Left   Pressure 18 18         Pupils       Pupils Dark Light Shape React APD   Right PERRL 3 2 Round Brisk None   Left PERRL 3 2 Round Brisk None         Visual Fields       Left Right    Full Full         Extraocular Movement       Right Left    Full, Ortho Full, Ortho         Neuro/Psych     Oriented x3: Yes   Mood/Affect: Normal         Dilation     Both eyes: 2.5% Phenylephrine @ 2:17 PM           Slit Lamp and Fundus Exam     Slit Lamp Exam       Right Left   Lids/Lashes Dermatochalasis - upper lid Dermatochalasis - upper lid   Conjunctiva/Sclera White and quiet White and quiet   Cornea 2+ Punctate epithelial erosions, well healed temporal cataract wounds trace Punctate epithelial erosions, mild arcus, well healed temporal cataract wounds   Anterior Chamber deep, clear, narrow temporal angle Deep and quiet   Iris Round and dilated, No NVI Round  and dilated, No NVI   Lens PC IOL in good position PC IOL in good position   Anterior Vitreous Vitreous syneresis Vitreous syneresis  Fundus Exam       Right Left   Disc mild Pallor, Sharp rim, no NVD Pink and Sharp   C/D Ratio 0.2 0.3   Macula good foveal reflex, scattered MA; scattered cystic changes - improved, no exudates good foveal reflex, scattered IRH -- improved, +cystic changes SN mac -- improved, +CWS, no exudate   Vessels attenuated, Tortuous attenuated, Tortuous   Periphery Attached, scattered IRH/DBH/CWS greatest nasal to disc Attached, scattered IRH/DBH/CWS greatest posteriorly           IMAGING AND PROCEDURES  Imaging and Procedures for 10/22/2022  OCT, Retina - OU - Both Eyes       Right Eye Quality was good. Central Foveal Thickness: 233. Progression has improved. Findings include normal foveal contour, no SRF, intraretinal hyper-reflective material, intraretinal fluid, outer retinal atrophy, vitreomacular adhesion (Mild interval improvement in IRF/edema nasal macula, no central cysts, mildly decreased ellipsoid signal centrally ).   Left Eye Quality was good. Central Foveal Thickness: 254. Progression has improved. Findings include normal foveal contour, no SRF, intraretinal hyper-reflective material, intraretinal fluid, vitreomacular adhesion (Mild interval improvement in IRF/IRHM nasal macula -- just trace cystic changes remain).   Notes *Images captured and stored on drive  Diagnosis / Impression:  DME OU OD: mild interval improvement in IRF/edema nasal macula, no central cysts, mildly decreased ellipsoid signal centrally OS: mild interval improvement in IRF/IRHM nasal macula -- just trace cystic changes remain  Clinical management:  See below  Abbreviations: NFP - Normal foveal profile. CME - cystoid macular edema. PED - pigment epithelial detachment. IRF - intraretinal fluid. SRF - subretinal fluid. EZ - ellipsoid zone. ERM - epiretinal  membrane. ORA - outer retinal atrophy. ORT - outer retinal tubulation. SRHM - subretinal hyper-reflective material. IRHM - intraretinal hyper-reflective material      Intravitreal Injection, Pharmacologic Agent - OD - Right Eye       Time Out 10/22/2022. 2:34 PM. Confirmed correct patient, procedure, site, and patient consented.   Anesthesia Topical anesthesia was used. Anesthetic medications included Lidocaine 2%, Proparacaine 0.5%.   Procedure Preparation included 5% betadine to ocular surface, eyelid speculum. A (32g) needle was used.   Injection: 2 mg aflibercept 2 MG/0.05ML   Route: Intravitreal, Site: Right Eye   NDC: L6038910, Lot: [id number], Expiration date: 11/23/2023, Waste: 0 mL   Post-op Post injection exam found visual acuity of at least counting fingers. The patient tolerated the procedure well. There were no complications. The patient received written and verbal post procedure care education. Post injection medications were not given.      Intravitreal Injection, Pharmacologic Agent - OS - Left Eye       Time Out 10/22/2022. 2:34 PM. Confirmed correct patient, procedure, site, and patient consented.   Anesthesia Topical anesthesia was used. Anesthetic medications included Lidocaine 2%, Proparacaine 0.5%.   Procedure Preparation included 5% betadine to ocular surface, eyelid speculum. A (32g) needle was used.   Injection: 2 mg aflibercept 2 MG/0.05ML   Route: Intravitreal, Site: Left Eye   NDC: L6038910, Lot: [id number], Expiration date: 11/23/2023, Waste: 0 mL   Post-op Post injection exam found visual acuity of at least counting fingers. The patient tolerated the procedure well. There were no complications. The patient received written and verbal post procedure care education. Post injection medications were not given.            ASSESSMENT/PLAN:    ICD-10-CM   1. Moderate nonproliferative diabetic retinopathy of both eyes with macular edema  associated with  type 2 diabetes mellitus (HCC)  E11.3313 OCT, Retina - OU - Both Eyes    Intravitreal Injection, Pharmacologic Agent - OD - Right Eye    Intravitreal Injection, Pharmacologic Agent - OS - Left Eye    aflibercept (EYLEA) SOLN 2 mg    aflibercept (EYLEA) SOLN 2 mg    2. Current use of insulin (HCC)  Z79.4     3. Long term (current) use of oral hypoglycemic drugs  Z79.84     4. Branch retinal vein occlusion of right eye with macular edema  H34.8310     5. Long-term (current) use of injectable non-insulin antidiabetic drugs  Z79.85     6. Essential hypertension  I10     7. Hypertensive retinopathy of both eyes  H35.033     8. Pseudophakia, both eyes  Z96.1      1-4. Moderate non-proliferative diabetic retinopathy, OU (OD>OS)  - last A1c was 8.8 on 04.15.24, 8.7 on 08.03.23 - s/p IVA OD #1 (12.31.21), #2 (02.04.22), #3 (03.04.22), #4 (04.01.22), #5 (05.05.22), #6 (07.15.22), #7 (11.09.22), #8 (12.21.22), #9 (02.01.23), #10 (03.01.23), #11 (04.05.23), #12 (05.10.23), #13 (06.14.23)  - s/p IVA OS #1 (01.04.22) - s/p IVA OU #2 (02.04.22), #3 (03.04.22), #4 (04.01.22),#5 (05.05.22), #6 (07.15.22), #7 (11.09.22), #8 (12.21.22), #9 (02.01.23), #10 (03.01.23), #11 (04.05.23), #12 (05.10.23)  **IVA resistance OU** - s/p IVE OS #1 (06.14.23), #2 (07.12.23), #3 (08.09.23), #4 (09.12.23), #5 (10.17.23), #6 (11.21.23), #7 (01.02.24), #8 (02.20.24), #9 (04.09.24), #10 (06.04.24) - s/p IVE OD #1 (07.12.23), #2 (08.09.23), #3 (09.12.23), #4 (10.17.23), #5 (12.21.23), #6 (01.02.24), #7 (02.20.24), #8 (04.09.24), #9 (06.04.24) - exam shows scattered MA/DBH OU, central edema OU improving - BCVA 20/30 OD, 20/25 OS -- dec OU - OCT shows OD: mild interval improvement in IRF/edema nasal and temporal macula, no central cysts, mildly decreased ellipsoid signal centrally; OS: Interval improvement in IRF/IRHM nasal macula -- just trace cystic changes remain at 8 weeks - recommend IVE OD #10 and IVE  OS #11 today, 07.30.24 w/ f/u in 8 wks - pt wishes to proceed w/ injections  - RBA of procedure discussed, questions answered  - Avastin informed consent obtained and re-signed on 05.10.23 (OU)  - Eylea informed consent obtained and signed on 06.16.23 (OU)  - Eylea informed consent obtained and signed on 07.29.24 (OU) - see procedure note - f/u in 8 weeks, DFE, OCT, possible injections  5. BRVO w/ CME OD  - s/p IVA OD x13 and IVE as above - BCVA 20/25 - OCT shows interval improvement in IRF/IRHM SN macula and fovea - recommend IVE OD today as above - f/u in 8 wks -- DFE/OCT/possible injection  6,7. Hypertensive retinopathy OU - discussed importance of tight BP control -monitor  8. Pseudophakia OU  - s/p CE/IOL OU (Dr. Elmer Picker)  - IOLs in good position, doing well - monitor  Ophthalmic Meds Ordered this visit:  Meds ordered this encounter  Medications   aflibercept (EYLEA) SOLN 2 mg   aflibercept (EYLEA) SOLN 2 mg     Return in about 8 weeks (around 12/17/2022) for f/u Mod NPDR OU, DFE, OCT, Possible, IVE, OU.  There are no Patient Instructions on file for this visit.  Explained the diagnoses, plan, and follow up with the patient and they expressed understanding.  Patient expressed understanding of the importance of proper follow up care.   This document serves as a record of services personally performed by Karie Chimera, MD, PhD. It was created on their  behalf by Gerilyn Nestle, COT an ophthalmic technician. The creation of this record is the provider's dictation and/or activities during the visit.    Electronically signed by:  Charlette Caffey, COT  10/22/22 4:16 PM  Karie Chimera, M.D., Ph.D. Diseases & Surgery of the Retina and Vitreous [facility]  I have reviewed the above documentation for accuracy and completeness, and I agree with the above. Karie Chimera, M.D., Ph.D. 10/22/22 4:19 PM   Abbreviations: M myopia (nearsighted);  A astigmatism; H hyperopia (farsighted); P presbyopia; Mrx spectacle prescription;  CTL contact lenses; OD right eye; OS left eye; OU both eyes  XT exotropia; ET esotropia; PEK punctate epithelial keratitis; PEE punctate epithelial erosions; DES dry eye syndrome; MGD meibomian gland dysfunction; ATs artificial tears; PFAT's preservative free artificial tears; NSC nuclear sclerotic cataract; PSC posterior subcapsular cataract; ERM epi-retinal membrane; PVD posterior vitreous detachment; RD retinal detachment; DM diabetes mellitus; DR diabetic retinopathy; NPDR non-proliferative diabetic retinopathy; PDR proliferative diabetic retinopathy; CSME clinically significant macular edema; DME diabetic macular edema; dbh dot blot hemorrhages; CWS cotton wool spot; POAG primary open angle glaucoma; C/D cup-to-disc ratio; HVF humphrey visual field; GVF goldmann visual field; OCT optical coherence tomography; IOP intraocular pressure; BRVO Branch retinal vein occlusion; CRVO central retinal vein occlusion; CRAO central retinal artery occlusion; BRAO branch retinal artery occlusion; RT retinal tear; SB scleral buckle; PPV pars plana vitrectomy; VH Vitreous hemorrhage; PRP panretinal laser photocoagulation; IVK intravitreal kenalog; VMT vitreomacular traction; MH Macular hole;  NVD neovascularization of the disc; NVE neovascularization elsewhere; AREDS age related eye disease study; ARMD age related macular degeneration; POAG primary open angle glaucoma; EBMD epithelial/anterior basement membrane dystrophy; ACIOL anterior chamber intraocular lens; IOL intraocular lens; PCIOL posterior chamber intraocular lens; Phaco/IOL phacoemulsification with intraocular lens placement; PRK photorefractive keratectomy; LASIK laser assisted in situ keratomileusis; HTN hypertension; DM diabetes mellitus; COPD chronic obstructive pulmonary disease

## 2022-10-22 ENCOUNTER — Encounter (INDEPENDENT_AMBULATORY_CARE_PROVIDER_SITE_OTHER): Payer: Self-pay | Admitting: Ophthalmology

## 2022-10-22 ENCOUNTER — Ambulatory Visit (INDEPENDENT_AMBULATORY_CARE_PROVIDER_SITE_OTHER): Payer: Medicare Other | Admitting: Ophthalmology

## 2022-10-22 DIAGNOSIS — Z794 Long term (current) use of insulin: Secondary | ICD-10-CM

## 2022-10-22 DIAGNOSIS — E113313 Type 2 diabetes mellitus with moderate nonproliferative diabetic retinopathy with macular edema, bilateral: Secondary | ICD-10-CM | POA: Diagnosis not present

## 2022-10-22 DIAGNOSIS — Z7984 Long term (current) use of oral hypoglycemic drugs: Secondary | ICD-10-CM

## 2022-10-22 DIAGNOSIS — H34831 Tributary (branch) retinal vein occlusion, right eye, with macular edema: Secondary | ICD-10-CM

## 2022-10-22 DIAGNOSIS — H35033 Hypertensive retinopathy, bilateral: Secondary | ICD-10-CM

## 2022-10-22 DIAGNOSIS — Z7985 Long-term (current) use of injectable non-insulin antidiabetic drugs: Secondary | ICD-10-CM

## 2022-10-22 DIAGNOSIS — Z961 Presence of intraocular lens: Secondary | ICD-10-CM

## 2022-10-22 DIAGNOSIS — I10 Essential (primary) hypertension: Secondary | ICD-10-CM

## 2022-10-22 MED ORDER — AFLIBERCEPT 2MG/0.05ML IZ SOLN FOR KALEIDOSCOPE
2.0000 mg | INTRAVITREAL | Status: AC | PRN
Start: 2022-10-22 — End: 2022-10-22
  Administered 2022-10-22: 2 mg via INTRAVITREAL

## 2022-11-14 ENCOUNTER — Encounter (HOSPITAL_COMMUNITY): Payer: Self-pay

## 2022-11-14 ENCOUNTER — Emergency Department (HOSPITAL_COMMUNITY): Payer: Medicare Other

## 2022-11-14 ENCOUNTER — Emergency Department (HOSPITAL_COMMUNITY)
Admission: EM | Admit: 2022-11-14 | Discharge: 2022-11-14 | Disposition: A | Discharge status: Home / Self Care | Payer: Medicare Other | Attending: Emergency Medicine | Admitting: Emergency Medicine

## 2022-11-14 ENCOUNTER — Other Ambulatory Visit: Payer: Self-pay

## 2022-11-14 DIAGNOSIS — Z7984 Long term (current) use of oral hypoglycemic drugs: Secondary | ICD-10-CM | POA: Diagnosis not present

## 2022-11-14 DIAGNOSIS — F0390 Unspecified dementia without behavioral disturbance: Secondary | ICD-10-CM | POA: Insufficient documentation

## 2022-11-14 DIAGNOSIS — E11649 Type 2 diabetes mellitus with hypoglycemia without coma: Secondary | ICD-10-CM | POA: Insufficient documentation

## 2022-11-14 DIAGNOSIS — D72829 Elevated white blood cell count, unspecified: Secondary | ICD-10-CM | POA: Insufficient documentation

## 2022-11-14 DIAGNOSIS — Z79899 Other long term (current) drug therapy: Secondary | ICD-10-CM | POA: Diagnosis not present

## 2022-11-14 DIAGNOSIS — L97519 Non-pressure chronic ulcer of other part of right foot with unspecified severity: Secondary | ICD-10-CM | POA: Insufficient documentation

## 2022-11-14 DIAGNOSIS — E11621 Type 2 diabetes mellitus with foot ulcer: Secondary | ICD-10-CM | POA: Insufficient documentation

## 2022-11-14 DIAGNOSIS — R251 Tremor, unspecified: Secondary | ICD-10-CM | POA: Diagnosis present

## 2022-11-14 DIAGNOSIS — Z794 Long term (current) use of insulin: Secondary | ICD-10-CM | POA: Insufficient documentation

## 2022-11-14 LAB — URINALYSIS, W/ REFLEX TO CULTURE (INFECTION SUSPECTED)
Bacteria, UA: NONE SEEN
Bilirubin Urine: NEGATIVE
Glucose, UA: 50 mg/dL — AB
Hgb urine dipstick: NEGATIVE
Ketones, ur: NEGATIVE mg/dL
Leukocytes,Ua: NEGATIVE
Nitrite: NEGATIVE
Protein, ur: 100 mg/dL — AB
Specific Gravity, Urine: 1.016 (ref 1.005–1.030)
pH: 5 (ref 5.0–8.0)

## 2022-11-14 LAB — CBC WITH DIFFERENTIAL/PLATELET
Abs Immature Granulocytes: 0.06 10*3/uL (ref 0.00–0.07)
Basophils Absolute: 0.1 10*3/uL (ref 0.0–0.1)
Basophils Relative: 1 %
Eosinophils Absolute: 0.2 10*3/uL (ref 0.0–0.5)
Eosinophils Relative: 2 %
HCT: 39.4 % (ref 39.0–52.0)
Hemoglobin: 12.4 g/dL — ABNORMAL LOW (ref 13.0–17.0)
Immature Granulocytes: 0 %
Lymphocytes Relative: 10 %
Lymphs Abs: 1.4 10*3/uL (ref 0.7–4.0)
MCH: 28.4 pg (ref 26.0–34.0)
MCHC: 31.5 g/dL (ref 30.0–36.0)
MCV: 90.2 fL (ref 80.0–100.0)
Monocytes Absolute: 0.9 10*3/uL (ref 0.1–1.0)
Monocytes Relative: 6 %
Neutro Abs: 11.1 10*3/uL — ABNORMAL HIGH (ref 1.7–7.7)
Neutrophils Relative %: 81 %
Platelets: 220 10*3/uL (ref 150–400)
RBC: 4.37 MIL/uL (ref 4.22–5.81)
RDW: 15.4 % (ref 11.5–15.5)
WBC: 13.7 10*3/uL — ABNORMAL HIGH (ref 4.0–10.5)
nRBC: 0 % (ref 0.0–0.2)

## 2022-11-14 LAB — COMPREHENSIVE METABOLIC PANEL
ALT: 27 U/L (ref 0–44)
AST: 34 U/L (ref 15–41)
Albumin: 3.8 g/dL (ref 3.5–5.0)
Alkaline Phosphatase: 78 U/L (ref 38–126)
Anion gap: 12 (ref 5–15)
BUN: 45 mg/dL — ABNORMAL HIGH (ref 8–23)
CO2: 22 mmol/L (ref 22–32)
Calcium: 9.2 mg/dL (ref 8.9–10.3)
Chloride: 101 mmol/L (ref 98–111)
Creatinine, Ser: 2.39 mg/dL — ABNORMAL HIGH (ref 0.61–1.24)
GFR, Estimated: 29 mL/min — ABNORMAL LOW (ref >60)
Glucose, Bld: 98 mg/dL (ref 70–99)
Potassium: 3.8 mmol/L (ref 3.5–5.1)
Sodium: 135 mmol/L (ref 135–145)
Total Bilirubin: 0.6 mg/dL (ref 0.3–1.2)
Total Protein: 7.4 g/dL (ref 6.5–8.1)

## 2022-11-14 LAB — CBG MONITORING, ED
Glucose-Capillary: 111 mg/dL — ABNORMAL HIGH (ref 70–99)
Glucose-Capillary: 95 mg/dL (ref 70–99)
Glucose-Capillary: 164 mg/dL — ABNORMAL HIGH (ref 70–99)
Glucose-Capillary: 215 mg/dL — ABNORMAL HIGH (ref 70–99)

## 2022-11-14 NOTE — ED Provider Notes (Signed)
[place] EMERGENCY DEPARTMENT AT [facility] Provider Note   CSN: [id number] Arrival date & time: 11/14/22  0124     History  Chief Complaint  Patient presents with   Hypoglycemia    Thomas Finley is a 67 y.o. male.  The history is provided by the patient, the EMS personnel and medical records.  Hypoglycemia Thomas Finley is a 67 y.o. male who presents to the Emergency Department complaining of hypoglycemia.  He presents to the emergency department by EMS from [facility] for evaluation of hypoglycemic episode.  He was found to be diaphoretic, weak when getting assisted to bed and he was found to be hypoglycemic to 36.  He received 25 g of D10 by EMS.  He is on insulin.  No reported recent injuries or illnesses.  Denies headache, chest pain, abdominal pain, nausea, vomiting.  No dysuria.  He states that he did eat this evening.   Last ate at 1730, gave 36u long acting insulin at 2100 for CBG 212. Found diaphoretic, shaky, and drowsy by staff. No recent illness or other complains   Home Medications Prior to Admission medications   Medication Sig Start Date End Date Taking? Authorizing Provider  atorvastatin (LIPITOR) 10 MG tablet Take 10 mg by mouth daily.    [provider]  blood glucose meter kit and supplies KIT Dispense based on patient and insurance preference. Use up to four times daily as directed. (FOR ICD-10 E11.65). 12/30/17   Roma Kayser, MD  Blood Glucose Monitoring Suppl (ACCU-CHEK GUIDE) w/Device KIT 1 Piece by Does not apply route as directed. 10/20/17   Roma Kayser, MD  buPROPion (WELLBUTRIN XL) 300 MG 24 hr tablet Take 300 mg by mouth daily. 04/27/20   [provider]  cephALEXin (KEFLEX) 500 MG capsule Take 500 mg by mouth 2 (two) times daily. 03/10/20   [provider]  cyanocobalamin 1000 MCG tablet Take by mouth.    [provider]  donepezil (ARICEPT) 5 MG tablet Take by mouth. 04/12/20   [provider]  doxycycline (ADOXA) 100 MG tablet Take 100 mg by mouth 2 (two) times daily. for 10 days 12/31/17   [provider]  Dulaglutide (TRULICITY) 1.5 MG/0.5ML SOPN Inject into the skin once a week. 09/30/19   [provider]  escitalopram (LEXAPRO) 20 MG tablet Take by mouth. 05/18/20   [provider]  fexofenadine (ALLEGRA) 180 MG tablet Take 180 mg by mouth daily.    [provider]  FLUoxetine (PROZAC) 20 MG tablet daily. 01/12/18   [provider]  FLUoxetine (PROZAC) 40 MG capsule 1 capsule DAILY (route: oral) 06/30/20   [provider]  glipiZIDE (GLUCOTROL) 10 MG tablet Take 5 mg by mouth 2 (two) times daily with a meal. 10/02/17   [provider]  glucose blood (ACCU-CHEK GUIDE) test strip Use as instructed 10/20/17   Roma Kayser, MD  glucose blood test strip 1 each by Other route as needed. Use as instructed 4 x daily. E11.65. One touch ultra 01/22/18   Roma Kayser, MD  hydrOXYzine (ATARAX/VISTARIL) 25 MG tablet Take 25 mg by mouth 2 (two) times daily. 02/23/20   [provider]  insulin degludec (TRESIBA FLEXTOUCH) 100 UNIT/ML SOPN FlexTouch Pen Inject 0.2 mLs (20 Units total) into the skin daily. 01/22/18   Roma Kayser, MD  insulin glargine, 2 Unit Dial, (TOUJEO MAX SOLOSTAR) 300 UNIT/ML Solostar Pen Inject into the skin. 09/01/20   [provider]  Insulin Glargine-Lixisenatide 100-33 UNT-MCG/ML SOPN Inject into the skin. 05/17/20   [provider]  insulin lispro (HUMALOG) 100 UNIT/ML KwikPen Inject into the skin. 09/03/20   [provider]  Insulin Pen Needle (BD PEN NEEDLE NANO U/F) 32G X 4 MM MISC 1 each by Does not apply route at bedtime. 01/22/18   Roma Kayser, MD  lansoprazole (PREVACID) 30 MG capsule Take by mouth. 06/21/20 06/21/21  [provider]  linagliptin (TRADJENTA) 5 MG TABS tablet Take 1 tablet (5 mg total) by mouth daily.  10/28/17   Roma Kayser, MD  lisinopril (PRINIVIL,ZESTRIL) 2.5 MG tablet Take 2.5 mg by mouth daily. 10/02/17   [provider]  metoCLOPramide (REGLAN) 10 MG tablet Take by mouth. 07/25/20   [provider]  Misc. Devices [facility] Bed Extension piece. 09/22/20   [provider]  pantoprazole (PROTONIX) 40 MG tablet Take by mouth.    [provider]  pioglitazone (ACTOS) 30 MG tablet Take by mouth. 05/17/20   [provider]      Allergies    Patient has no known allergies.    Review of Systems   Review of Systems  All other systems reviewed and are negative.   Physical Exam Updated Vital Signs BP 139/87   Pulse 84   Temp 97.6 F (36.4 C)   Resp 13   SpO2 99%  Physical Exam Vitals and nursing note reviewed.  Constitutional:      Appearance: He is well-developed.  HENT:     Head: Normocephalic and atraumatic.  Cardiovascular:     Rate and Rhythm: Normal rate and regular rhythm.     Heart sounds: No murmur heard. Pulmonary:     Effort: Pulmonary effort is normal. No respiratory distress.     Breath sounds: Normal breath sounds.  Abdominal:     Palpations: Abdomen is soft.     Tenderness: There is no abdominal tenderness. There is no guarding or rebound.  Musculoskeletal:        General: No tenderness.     Comments: There is a wound to the plantar surface of the right foot without significant drainage or surrounding erythema.  This had a dressing in place that was clean and dry.  Skin:    General: Skin is warm and dry.  Neurological:     Mental Status: He is alert.     Comments: 5 out of 5 strength in all 4 extremities.  He is slow to answer questions.  He is oriented to person and year.  Disoriented to location.  He is aware of recent hypoglycemia.  Psychiatric:        Behavior: Behavior normal.     ED Results / Procedures / Treatments   Labs (all labs ordered are listed, but only abnormal results are  displayed) Labs Reviewed  COMPREHENSIVE METABOLIC PANEL - Abnormal; Notable for the following components:      Result Value   BUN 45 (*)    Creatinine, Ser 2.39 (*)    GFR, Estimated 29 (*)    All other components within normal limits  CBC WITH DIFFERENTIAL/PLATELET - Abnormal; Notable for the following components:   WBC 13.7 (*)    Hemoglobin 12.4 (*)    Neutro Abs 11.1 (*)    All other components within normal limits  URINALYSIS, W/ REFLEX TO CULTURE (INFECTION SUSPECTED) - Abnormal; Notable for the following components:   Glucose, UA 50 (*)    Protein, ur  100 (*)    All other components within normal limits  CBG MONITORING, ED - Abnormal; Notable for the following components:   Glucose-Capillary 111 (*)    All other components within normal limits  CBG MONITORING, ED - Abnormal; Notable for the following components:   Glucose-Capillary 164 (*)    All other components within normal limits  CBG MONITORING, ED - Abnormal; Notable for the following components:   Glucose-Capillary 215 (*)    All other components within normal limits  CBG MONITORING, ED    EKG None  Radiology CT Head Wo Contrast  Result Date: 11/14/2022 CLINICAL DATA:  Weekend diaphoretic.  Mental status change. EXAM: CT HEAD WITHOUT CONTRAST TECHNIQUE: Contiguous axial images were obtained from the base of the skull through the vertex without intravenous contrast. RADIATION DOSE REDUCTION: This exam was performed according to the departmental dose-optimization program which includes automated exposure control, adjustment of the mA and/or kV according to patient size and/or use of iterative reconstruction technique. COMPARISON:  None Available. FINDINGS: Brain: Motion degrades detail. No intracranial hemorrhage, mass effect, or evidence of acute infarct. No hydrocephalus. No extra-axial fluid collection. Age-commensurate cerebral atrophy and ill-defined hypoattenuation within the cerebral white matter consistent with  chronic small vessel ischemic disease. Vascular: No hyperdense vessel. Intracranial arterial calcification. Skull: No definite fracture though evaluation is compromised by motion. Possible left forehead contusion. Sinuses/Orbits: No acute finding. Paranasal sinuses and mastoid air cells are well aerated. Other: None. IMPRESSION: Motion degraded exam despite repeat scanning. No definite acute intracranial abnormality. Possible left forehead contusion. Electronically Signed   By: Minerva Fester M.D.   On: 11/14/2022 04:01   DG Chest Port 1 View  Result Date: 11/14/2022 CLINICAL DATA:  Weakness, hypoglycemia, and diabetic foot wound with ulcer at the base of the big toe plantar surface. EXAM: PORTABLE CHEST 1 VIEW RIGHT FOOT, AP AND LATERAL VIEWS COMPARISON:  None. FINDINGS: Chest AP portable, 2:12 a.m.: There is mild cardiomegaly without evidence of CHF. The mediastinum is normally outlined. The lungs are clear. The sulci are sharp. There is osteopenia. Thoracic spondylosis. Multiple overlying monitor wires. Right foot, AP and lateral only: There is mild osteopenia without evidence of fractures or acute osteomyelitis. There is mild hallux valgus. Mild nonerosive arthrosis of the toe joints, first MTP joint. There is a small plantar calcaneal spur. There is dystrophic calcification in the most distal Achilles tendon. There are mild features of midfoot arthrosis. There are vascular calcifications in the distal foreleg and foot. On the lateral view, a subcutaneous subcentimeter air droplet is noted underlying the hallux sesamoid bones. This could possibly be the location of the ulcer. There is mild thickening of the surrounding skin. IMPRESSION: 1. No evidence of acute chest disease.  Mild cardiomegaly. 2. Osteopenia without evidence of right foot fractures or acute osteomyelitis. 3. Mild hallux valgus with midfoot and forefoot arthrosis. 4. Peripheral vascular disease. 5. Dystrophic calcification in the most distal  Achilles tendon. 6. Subcentimeter subcutaneous air droplet underlying the hallux sesamoid bones on the lateral view. This could possibly be the location of the ulcer. Electronically Signed   By: Almira Bar M.D.   On: 11/14/2022 02:38   DG Foot 2 Views Right  Result Date: 11/14/2022 CLINICAL DATA:  Weakness, hypoglycemia, and diabetic foot wound with ulcer at the base of the big toe plantar surface. EXAM: PORTABLE CHEST 1 VIEW RIGHT FOOT, AP AND LATERAL VIEWS COMPARISON:  None. FINDINGS: Chest AP portable, 2:12 a.m.: There is mild cardiomegaly without evidence  of CHF. The mediastinum is normally outlined. The lungs are clear. The sulci are sharp. There is osteopenia. Thoracic spondylosis. Multiple overlying monitor wires. Right foot, AP and lateral only: There is mild osteopenia without evidence of fractures or acute osteomyelitis. There is mild hallux valgus. Mild nonerosive arthrosis of the toe joints, first MTP joint. There is a small plantar calcaneal spur. There is dystrophic calcification in the most distal Achilles tendon. There are mild features of midfoot arthrosis. There are vascular calcifications in the distal foreleg and foot. On the lateral view, a subcutaneous subcentimeter air droplet is noted underlying the hallux sesamoid bones. This could possibly be the location of the ulcer. There is mild thickening of the surrounding skin. IMPRESSION: 1. No evidence of acute chest disease.  Mild cardiomegaly. 2. Osteopenia without evidence of right foot fractures or acute osteomyelitis. 3. Mild hallux valgus with midfoot and forefoot arthrosis. 4. Peripheral vascular disease. 5. Dystrophic calcification in the most distal Achilles tendon. 6. Subcentimeter subcutaneous air droplet underlying the hallux sesamoid bones on the lateral view. This could possibly be the location of the ulcer. Electronically Signed   By: Almira Bar M.D.   On: 11/14/2022 02:38    Procedures Procedures    Medications  Ordered in ED Medications - No data to display  ED Course/ Medical Decision Making/ A&P                                 Medical Decision Making Amount and/or Complexity of Data Reviewed Labs: ordered. Radiology: ordered.   Patient with history of diabetes, dementia here for evaluation of hypoglycemic episode.  He last ate at 530.  He had a blood sugar of 36 prior to ED arrival and received D10 by EMS.  He has eaten in the emergency department.  He has no complaints during his ED stay.  He is somewhat drowsy at times but it is the middle the night.  CBC with mild leukocytosis.  Blood sugars have remained stable to slightly elevated.  He does have a wound to the plantar surface of his right foot that does not appear infected.  CT head without significant abnormality.  Chest x-ray and plain film of the foot without concerning findings.  BMP with slightly worsened renal function when compared to prior in care everywhere.  Discussed these labs findings with patient's POA over the phone [facility] and that he will need them rechecked soon.  Thomas Finley is scheduled to follow-up with endocrinology later today.  Discussed importance of oral fluid hydration.  Patient without complaints during his entire ED stay.  Patient more awake and alert on repeat evaluation.  Feel he is stable for discharge home with outpatient follow-up and return precautions.       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

## 2022-11-14 NOTE — ED Notes (Signed)
8oz Orange juice consumed by patient.

## 2022-11-14 NOTE — Discharge Instructions (Signed)
Thomas Finley has a wound on his right foot that will need to be followed very closely to look for signs of infection.  He should be rechecked by his doctor in the next several days to a week.  Have him rechecked sooner if he has any new or concerning symptoms.

## 2022-11-14 NOTE — ED Triage Notes (Signed)
Arrives [facility] from [facility] after staff was assisting pt to bed. Pt was weak and diaphoretic.   Staff checked cbg and it  was 36. Paramedics administer 25g D10 with improvement to 144.   Pt alert, oriented. Says he takes insulin but unsure which kind.

## 2022-11-14 NOTE — ED Notes (Signed)
Given sandwich tray. 100% of sandwich eaten

## 2022-11-28 ENCOUNTER — Encounter (HOSPITAL_COMMUNITY): Payer: Self-pay

## 2022-11-28 ENCOUNTER — Emergency Department (HOSPITAL_COMMUNITY): Payer: Medicare Other

## 2022-11-28 ENCOUNTER — Other Ambulatory Visit: Payer: Self-pay

## 2022-11-28 ENCOUNTER — Emergency Department (HOSPITAL_COMMUNITY)
Admission: EM | Admit: 2022-11-28 | Discharge: 2022-11-28 | Disposition: A | Discharge status: Home / Self Care | Payer: Medicare Other | Source: Home / Self Care | Attending: Emergency Medicine | Admitting: Emergency Medicine

## 2022-11-28 DIAGNOSIS — N179 Acute kidney failure, unspecified: Secondary | ICD-10-CM | POA: Insufficient documentation

## 2022-11-28 DIAGNOSIS — Z794 Long term (current) use of insulin: Secondary | ICD-10-CM | POA: Insufficient documentation

## 2022-11-28 DIAGNOSIS — F0390 Unspecified dementia without behavioral disturbance: Secondary | ICD-10-CM | POA: Insufficient documentation

## 2022-11-28 DIAGNOSIS — R7881 Bacteremia: Secondary | ICD-10-CM | POA: Diagnosis not present

## 2022-11-28 DIAGNOSIS — E119 Type 2 diabetes mellitus without complications: Secondary | ICD-10-CM | POA: Insufficient documentation

## 2022-11-28 DIAGNOSIS — Z7984 Long term (current) use of oral hypoglycemic drugs: Secondary | ICD-10-CM | POA: Insufficient documentation

## 2022-11-28 DIAGNOSIS — I959 Hypotension, unspecified: Secondary | ICD-10-CM | POA: Insufficient documentation

## 2022-11-28 DIAGNOSIS — Z79899 Other long term (current) drug therapy: Secondary | ICD-10-CM | POA: Insufficient documentation

## 2022-11-28 DIAGNOSIS — A4151 Sepsis due to Escherichia coli [E. coli]: Secondary | ICD-10-CM | POA: Diagnosis not present

## 2022-11-28 DIAGNOSIS — I10 Essential (primary) hypertension: Secondary | ICD-10-CM | POA: Insufficient documentation

## 2022-11-28 LAB — COMPREHENSIVE METABOLIC PANEL
ALT: 30 U/L (ref 0–44)
AST: 28 U/L (ref 15–41)
Albumin: 3.8 g/dL (ref 3.5–5.0)
Alkaline Phosphatase: 76 U/L (ref 38–126)
Anion gap: 11 (ref 5–15)
BUN: 48 mg/dL — ABNORMAL HIGH (ref 8–23)
CO2: 25 mmol/L (ref 22–32)
Calcium: 9.5 mg/dL (ref 8.9–10.3)
Chloride: 97 mmol/L — ABNORMAL LOW (ref 98–111)
Creatinine, Ser: 3.03 mg/dL — ABNORMAL HIGH (ref 0.61–1.24)
GFR, Estimated: 22 mL/min — ABNORMAL LOW (ref >60)
Glucose, Bld: 142 mg/dL — ABNORMAL HIGH (ref 70–99)
Potassium: 4.5 mmol/L (ref 3.5–5.1)
Sodium: 133 mmol/L — ABNORMAL LOW (ref 135–145)
Total Bilirubin: 0.5 mg/dL (ref 0.3–1.2)
Total Protein: 8.3 g/dL — ABNORMAL HIGH (ref 6.5–8.1)

## 2022-11-28 LAB — CBC WITH DIFFERENTIAL/PLATELET
Abs Immature Granulocytes: 0.17 10*3/uL — ABNORMAL HIGH (ref 0.00–0.07)
Basophils Absolute: 0.1 10*3/uL (ref 0.0–0.1)
Basophils Relative: 0 %
Eosinophils Absolute: 0.1 10*3/uL (ref 0.0–0.5)
Eosinophils Relative: 1 %
HCT: 37.8 % — ABNORMAL LOW (ref 39.0–52.0)
Hemoglobin: 12.1 g/dL — ABNORMAL LOW (ref 13.0–17.0)
Immature Granulocytes: 1 %
Lymphocytes Relative: 10 %
Lymphs Abs: 1.6 10*3/uL (ref 0.7–4.0)
MCH: 27.6 pg (ref 26.0–34.0)
MCHC: 32 g/dL (ref 30.0–36.0)
MCV: 86.3 fL (ref 80.0–100.0)
Monocytes Absolute: 0.9 10*3/uL (ref 0.1–1.0)
Monocytes Relative: 6 %
Neutro Abs: 13.2 10*3/uL — ABNORMAL HIGH (ref 1.7–7.7)
Neutrophils Relative %: 82 %
Platelets: 342 10*3/uL (ref 150–400)
RBC: 4.38 MIL/uL (ref 4.22–5.81)
RDW: 14.9 % (ref 11.5–15.5)
WBC: 16 10*3/uL — ABNORMAL HIGH (ref 4.0–10.5)
nRBC: 0 % (ref 0.0–0.2)

## 2022-11-28 LAB — URINALYSIS, ROUTINE W REFLEX MICROSCOPIC
Bilirubin Urine: NEGATIVE
Glucose, UA: NEGATIVE mg/dL
Hgb urine dipstick: NEGATIVE
Ketones, ur: 5 mg/dL — AB
Nitrite: NEGATIVE
Protein, ur: 100 mg/dL — AB
Specific Gravity, Urine: 1.015 (ref 1.005–1.030)
pH: 5 (ref 5.0–8.0)

## 2022-11-28 LAB — TROPONIN I (HIGH SENSITIVITY)
Troponin I (High Sensitivity): 10 ng/L (ref <18)
Troponin I (High Sensitivity): 12 ng/L (ref <18)

## 2022-11-28 MED ORDER — CEPHALEXIN 500 MG PO CAPS
500.0000 mg | ORAL_CAPSULE | Freq: Once | ORAL | Status: AC
  Administered 2022-11-28: 500 mg via ORAL
  Filled 2022-11-28: qty 1

## 2022-11-28 MED ORDER — LACTATED RINGERS IV BOLUS
1000.0000 mL | Freq: Once | INTRAVENOUS | Status: AC
  Administered 2022-11-28: 1000 mL via INTRAVENOUS

## 2022-11-28 MED ORDER — CEPHALEXIN 500 MG PO CAPS: 500.0000 mg | ORAL_CAPSULE | Freq: Four times a day (QID) | ORAL | 0 refills | Status: DC

## 2022-11-28 MED ORDER — SODIUM CHLORIDE 0.9 % IV BOLUS
1000.0000 mL | Freq: Once | INTRAVENOUS | Status: AC
  Administered 2022-11-28: 1000 mL via INTRAVENOUS

## 2022-11-28 MED ORDER — ACETAMINOPHEN 325 MG PO TABS
650.0000 mg | ORAL_TABLET | Freq: Once | ORAL | Status: AC
  Administered 2022-11-28: 650 mg via ORAL
  Filled 2022-11-28: qty 2

## 2022-11-28 NOTE — ED Notes (Signed)
[facility] called  

## 2022-11-28 NOTE — Discharge Instructions (Signed)
Drink plenty of fluids.  Follow up with your md next week °

## 2022-11-28 NOTE — ED Provider Notes (Signed)
[place] EMERGENCY DEPARTMENT AT [facility] Provider Note   CSN: [id number] Arrival date & time: 11/28/22  1110     History  Chief Complaint  Patient presents with   Hypotension    Jonh Poffenberger is a 67 y.o. male w/ dementia, T2DM, HTN, who presents from facility w/ hypotension. Reportedly had HoTN to 80s systolic at facility. No fevers or associated symptoms. Patient has h/o dementia and is poor historian but is A&Ox3 (not to situation), does not know why he is here but states he feels normal now and has no pain. Per facility is at his mental status baseline. Only antihypertensive listed in patient's chart is lisinopril 2.5 mg daily. No reported recent changes to his meds. Initial BP here is 140s systolic.    Past Medical History:  Diagnosis Date   Diabetes (HCC)    Diabetic retinopathy (HCC)    NPDR OU   Hypertension    Hypertensive retinopathy    OU       Home Medications Prior to Admission medications   Medication Sig Start Date End Date Taking? Authorizing Provider  atorvastatin (LIPITOR) 10 MG tablet Take 10 mg by mouth daily.    [provider]  blood glucose meter kit and supplies KIT Dispense based on patient and insurance preference. Use up to four times daily as directed. (FOR ICD-10 E11.65). 12/30/17   Roma Kayser, MD  Blood Glucose Monitoring Suppl (ACCU-CHEK GUIDE) w/Device KIT 1 Piece by Does not apply route as directed. 10/20/17   Roma Kayser, MD  buPROPion (WELLBUTRIN XL) 300 MG 24 hr tablet Take 300 mg by mouth daily. 04/27/20   [provider]  cephALEXin (KEFLEX) 500 MG capsule Take 500 mg by mouth 2 (two) times daily. 03/10/20   [provider]  cyanocobalamin 1000 MCG tablet Take by mouth.    [provider]  donepezil (ARICEPT) 5 MG tablet Take by mouth. 04/12/20   [provider]  doxycycline (ADOXA) 100 MG tablet Take 100 mg by mouth 2 (two) times daily. for 10 days 12/31/17    [provider]  Dulaglutide (TRULICITY) 1.5 MG/0.5ML SOPN Inject into the skin once a week. 09/30/19   [provider]  escitalopram (LEXAPRO) 20 MG tablet Take by mouth. 05/18/20   [provider]  fexofenadine (ALLEGRA) 180 MG tablet Take 180 mg by mouth daily.    [provider]  FLUoxetine (PROZAC) 20 MG tablet daily. 01/12/18   [provider]  FLUoxetine (PROZAC) 40 MG capsule 1 capsule DAILY (route: oral) 06/30/20   [provider]  glipiZIDE (GLUCOTROL) 10 MG tablet Take 5 mg by mouth 2 (two) times daily with a meal. 10/02/17   [provider]  glucose blood (ACCU-CHEK GUIDE) test strip Use as instructed 10/20/17   Roma Kayser, MD  glucose blood test strip 1 each by Other route as needed. Use as instructed 4 x daily. E11.65. One touch ultra 01/22/18   Roma Kayser, MD  hydrOXYzine (ATARAX/VISTARIL) 25 MG tablet Take 25 mg by mouth 2 (two) times daily. 02/23/20   [provider]  insulin degludec (TRESIBA FLEXTOUCH) 100 UNIT/ML SOPN FlexTouch Pen Inject 0.2 mLs (20 Units total) into the skin daily. 01/22/18   Roma Kayser, MD  insulin glargine, 2 Unit Dial, (TOUJEO MAX SOLOSTAR) 300 UNIT/ML Solostar Pen Inject into the skin. 09/01/20   [provider]  Insulin Glargine-Lixisenatide 100-33 UNT-MCG/ML SOPN Inject into the skin. 05/17/20   [provider]  insulin lispro (HUMALOG) 100 UNIT/ML KwikPen Inject into the skin. 09/03/20   [provider]  Insulin Pen Needle (BD PEN NEEDLE NANO U/F) 32G X 4 MM MISC 1 each by Does not apply route at bedtime. 01/22/18   Roma Kayser, MD  lansoprazole (PREVACID) 30 MG capsule Take by mouth. 06/21/20 06/21/21  [provider]  linagliptin (TRADJENTA) 5 MG TABS tablet Take 1 tablet (5 mg total) by mouth daily. 10/28/17   Roma Kayser, MD  lisinopril (PRINIVIL,ZESTRIL) 2.5 MG tablet Take 2.5 mg by mouth daily. 10/02/17    [provider]  metoCLOPramide (REGLAN) 10 MG tablet Take by mouth. 07/25/20   [provider]  Misc. Devices [facility] Bed Extension piece. 09/22/20   [provider]  pantoprazole (PROTONIX) 40 MG tablet Take by mouth.    [provider]  pioglitazone (ACTOS) 30 MG tablet Take by mouth. 05/17/20   [provider]      Allergies    Patient has no known allergies.    Review of Systems   Review of Systems A 10 point review of systems was performed and is negative unless otherwise reported in HPI.  Physical Exam Updated Vital Signs BP 122/63   Pulse 96   Temp 98.5 F (36.9 C) (Oral)   Resp (!) 24   SpO2 100%  Physical Exam General: Normal appearing elderly male, lying in bed.  HEENT: PERRLA, Sclera anicteric, MMM, trachea midline.  Cardiology: RRR, no murmurs/rubs/gallops. BL radial and DP pulses equal bilaterally.  Resp: Normal respiratory rate and effort. CTAB, no wheezes, rhonchi, crackles.  Abd: Soft, non-tender, non-distended. No rebound tenderness or guarding.  GU: Deferred. MSK: No peripheral edema or signs of trauma.  Skin: warm, dry.  Back: No CVA tenderness Neuro: A&Ox3 (not to situation), CNs II-XII grossly intact. MAEs. Sensation grossly intact.  Psych: Normal mood and affect.   ED Results / Procedures / Treatments   Labs (all labs ordered are listed, but only abnormal results are displayed) Labs Reviewed  CBC WITH DIFFERENTIAL/PLATELET - Abnormal; Notable for the following components:      Result Value   WBC 16.0 (*)    Hemoglobin 12.1 (*)    HCT 37.8 (*)    Neutro Abs 13.2 (*)    Abs Immature Granulocytes 0.17 (*)    All other components within normal limits  COMPREHENSIVE METABOLIC PANEL - Abnormal; Notable for the following components:   Sodium 133 (*)    Chloride 97 (*)    Glucose, Bld 142 (*)    BUN 48 (*)    Creatinine, Ser 3.03 (*)    Total Protein 8.3 (*)    GFR, Estimated 22 (*)    All other  components within normal limits  CULTURE, BLOOD (ROUTINE X 2)  CULTURE, BLOOD (ROUTINE X 2)  URINE CULTURE  URINALYSIS, ROUTINE W REFLEX MICROSCOPIC  TROPONIN I (HIGH SENSITIVITY)  TROPONIN I (HIGH SENSITIVITY)    EKG EKG Interpretation Date/Time:  Thursday November 28 2022 11:40:44 EDT Ventricular Rate:  92 PR Interval:  178 QRS Duration:  87 QT Interval:  361 QTC Calculation: 447 R Axis:   68  Text Interpretation: Sinus rhythm Confirmed by Vivi Barrack [phone]) on 11/28/2022 2:15:19 PM  Radiology DG Chest 2 View  Result Date: 11/28/2022 CLINICAL DATA:  Dementia.  Hypertension EXAM: CHEST - 2 VIEW COMPARISON:  X-ray 11/13/2022 FINDINGS: Stable cardiopericardial silhouette. No consolidation, pneumothorax or effusion. No edema. Overlapping cardiac leads. Film is  under penetrated. IMPRESSION: No acute cardiopulmonary disease Electronically Signed   By: Karen Kays M.D.   On: 11/28/2022 13:52    Procedures Procedures    Medications Ordered in ED Medications  lactated ringers bolus 1,000 mL (1,000 mLs Intravenous New Bag/Given 11/28/22 1443)    ED Course/ Medical Decision Making/ A&P                          Medical Decision Making Amount and/or Complexity of Data Reviewed Labs: ordered. Decision-making details documented in ED Course. Radiology: ordered.    This patient presents to the ED for concern of hypotension, this involves an extensive number of treatment options, and is a complaint that carries with it a high risk of complications and morbidity.  I considered the following differential and admission for this acute, potentially life threatening condition.   MDM:    Patient's BP is normal on arrival but had reported hypotension at facility. For hypotension, consider possible hypovolemia though no tachycardia and no beta blockade. If becomes hypotensive again will start with fluid resuscitation and reevaluate. Also consider septic shock - he is not febrile or  tachycardic but will assess for source of infection including UTI or PNA. No source of infection obvious on skin exam. Patient denies abd pain, urinary sxs, cough, SOB, but he is poor historian w/ h/o dementia. Also consider cardiogenic shock though he denies CP, will get troponin. EKG w/o signs of ischemia. Must consider obstructive shock as well or anaphylactic, but he has no hypoxia or recurrent hypotension, heart on CXR is of normal size/contour.    Clinical Course as of 11/28/22 1444  Thu Nov 28, 2022  1236 WBC(!): 16.0 Increased white count from 2 weeks ago 13 [HN]  1414 Troponin I (High Sensitivity): 10 neg [HN]  1416 Creatinine(!): 3.03 +AKI up from 2.39 two weeks ago. Given that patient has dementia, cannot communicate necessarily his symptoms, will get CT abd pelvis for further evaluation. Consider pyelonephritis, urolithiasis, renal abscess, obstructive uropathy.  [HN]  1435 Awaiting urine, patient bladder scanned and has 0cc in bladder. Will give IVF.  [HN]    Clinical Course User Index [HN] Loetta Rough, MD    Labs: I Ordered, and personally interpreted labs.  The pertinent results include:  those listed above  Imaging Studies ordered: I ordered imaging studies including CT abd pelvis wo contrast I independently visualized and interpreted imaging. I agree with the radiologist interpretation  Additional history obtained from chart review.    Cardiac Monitoring: The patient was maintained on a cardiac monitor.  I personally viewed and interpreted the cardiac monitored which showed an underlying rhythm of: NSR  Reevaluation: After the interventions noted above, I reevaluated the patient and found that they have :improved  Social Determinants of Health: Lives at SNF  Disposition:  Patient is signed out to the oncoming ED physician Dr. Estell Harpin who is made aware of his history, presentation, exam, workup, and plan. Plan is to give fluids, get CT abd pelvis, obtain urine,  likely admit.    Co morbidities that complicate the patient evaluation  Past Medical History:  Diagnosis Date   Diabetes (HCC)    Diabetic retinopathy (HCC)    NPDR OU   Hypertension    Hypertensive retinopathy    OU     Medicines Meds ordered this encounter  Medications   lactated ringers bolus 1,000 mL    I have reviewed the patients home medicines and  have made adjustments as needed  Problem List / ED Course: Problem List Items Addressed This Visit   None Visit Diagnoses     Hypotension, unspecified hypotension type    -  Primary   AKI (acute kidney injury) (HCC)                       This note was created using dictation software, which may contain spelling or grammatical errors.    Loetta Rough, MD 11/28/22 [phone]

## 2022-11-28 NOTE — ED Triage Notes (Signed)
Pt coming from [place] at [place], memory care unit. Pt found to have a systolic pressure of this morning while taking vitals. Hx of dementia, at baseline.

## 2022-11-28 NOTE — ED Provider Notes (Signed)
Patient with dehydration and mild AKI.  He has been given 2 L of fluids and is no longer hypotensive.  Patient urinalysis shows 11-20 white cells.  He patient will be cultured and he will empirically be started on Keflex and will follow-up with his physician next week   Bethann Berkshire, MD 11/28/22 2028

## 2022-11-29 ENCOUNTER — Inpatient Hospital Stay (HOSPITAL_COMMUNITY)
Admission: EM | Admit: 2022-11-29 | Discharge: 2022-12-04 | DRG: 871 | Disposition: A | Discharge status: Hospice | Payer: Medicare Other | Source: Skilled Nursing Facility | Attending: Family Medicine | Admitting: Family Medicine

## 2022-11-29 ENCOUNTER — Emergency Department (HOSPITAL_COMMUNITY): Payer: Medicare Other

## 2022-11-29 ENCOUNTER — Other Ambulatory Visit: Payer: Self-pay

## 2022-11-29 ENCOUNTER — Encounter (HOSPITAL_COMMUNITY): Payer: Self-pay

## 2022-11-29 ENCOUNTER — Inpatient Hospital Stay (HOSPITAL_COMMUNITY): Payer: Medicare Other

## 2022-11-29 DIAGNOSIS — E871 Hypo-osmolality and hyponatremia: Secondary | ICD-10-CM | POA: Diagnosis present

## 2022-11-29 DIAGNOSIS — K219 Gastro-esophageal reflux disease without esophagitis: Secondary | ICD-10-CM | POA: Diagnosis present

## 2022-11-29 DIAGNOSIS — A4181 Sepsis due to Enterococcus: Secondary | ICD-10-CM | POA: Diagnosis present

## 2022-11-29 DIAGNOSIS — E785 Hyperlipidemia, unspecified: Secondary | ICD-10-CM | POA: Diagnosis present

## 2022-11-29 DIAGNOSIS — Z794 Long term (current) use of insulin: Secondary | ICD-10-CM

## 2022-11-29 DIAGNOSIS — E113293 Type 2 diabetes mellitus with mild nonproliferative diabetic retinopathy without macular edema, bilateral: Secondary | ICD-10-CM | POA: Diagnosis present

## 2022-11-29 DIAGNOSIS — I129 Hypertensive chronic kidney disease with stage 1 through stage 4 chronic kidney disease, or unspecified chronic kidney disease: Secondary | ICD-10-CM | POA: Diagnosis present

## 2022-11-29 DIAGNOSIS — E1122 Type 2 diabetes mellitus with diabetic chronic kidney disease: Secondary | ICD-10-CM | POA: Diagnosis present

## 2022-11-29 DIAGNOSIS — G300 Alzheimer's disease with early onset: Secondary | ICD-10-CM | POA: Diagnosis not present

## 2022-11-29 DIAGNOSIS — F05 Delirium due to known physiological condition: Secondary | ICD-10-CM | POA: Diagnosis not present

## 2022-11-29 DIAGNOSIS — I959 Hypotension, unspecified: Secondary | ICD-10-CM | POA: Diagnosis present

## 2022-11-29 DIAGNOSIS — R7881 Bacteremia: Secondary | ICD-10-CM | POA: Diagnosis present

## 2022-11-29 DIAGNOSIS — R6520 Severe sepsis without septic shock: Secondary | ICD-10-CM | POA: Diagnosis present

## 2022-11-29 DIAGNOSIS — I10 Essential (primary) hypertension: Secondary | ICD-10-CM | POA: Diagnosis present

## 2022-11-29 DIAGNOSIS — Z888 Allergy status to other drugs, medicaments and biological substances status: Secondary | ICD-10-CM

## 2022-11-29 DIAGNOSIS — L97519 Non-pressure chronic ulcer of other part of right foot with unspecified severity: Secondary | ICD-10-CM | POA: Diagnosis present

## 2022-11-29 DIAGNOSIS — R410 Disorientation, unspecified: Secondary | ICD-10-CM | POA: Insufficient documentation

## 2022-11-29 DIAGNOSIS — N179 Acute kidney failure, unspecified: Secondary | ICD-10-CM | POA: Diagnosis present

## 2022-11-29 DIAGNOSIS — N390 Urinary tract infection, site not specified: Secondary | ICD-10-CM | POA: Diagnosis present

## 2022-11-29 DIAGNOSIS — Z515 Encounter for palliative care: Secondary | ICD-10-CM | POA: Diagnosis not present

## 2022-11-29 DIAGNOSIS — F03911 Unspecified dementia, unspecified severity, with agitation: Secondary | ICD-10-CM | POA: Diagnosis present

## 2022-11-29 DIAGNOSIS — Z7984 Long term (current) use of oral hypoglycemic drugs: Secondary | ICD-10-CM | POA: Diagnosis not present

## 2022-11-29 DIAGNOSIS — N1832 Chronic kidney disease, stage 3b: Secondary | ICD-10-CM | POA: Diagnosis present

## 2022-11-29 DIAGNOSIS — F0280 Dementia in other diseases classified elsewhere without behavioral disturbance: Secondary | ICD-10-CM | POA: Diagnosis not present

## 2022-11-29 DIAGNOSIS — Z66 Do not resuscitate: Secondary | ICD-10-CM | POA: Diagnosis present

## 2022-11-29 DIAGNOSIS — G9341 Metabolic encephalopathy: Secondary | ICD-10-CM | POA: Diagnosis present

## 2022-11-29 DIAGNOSIS — E860 Dehydration: Secondary | ICD-10-CM | POA: Diagnosis present

## 2022-11-29 DIAGNOSIS — Z79899 Other long term (current) drug therapy: Secondary | ICD-10-CM

## 2022-11-29 DIAGNOSIS — F419 Anxiety disorder, unspecified: Secondary | ICD-10-CM | POA: Diagnosis present

## 2022-11-29 DIAGNOSIS — A419 Sepsis, unspecified organism: Secondary | ICD-10-CM | POA: Diagnosis not present

## 2022-11-29 DIAGNOSIS — B952 Enterococcus as the cause of diseases classified elsewhere: Secondary | ICD-10-CM | POA: Diagnosis not present

## 2022-11-29 DIAGNOSIS — A4151 Sepsis due to Escherichia coli [E. coli]: Principal | ICD-10-CM | POA: Diagnosis present

## 2022-11-29 DIAGNOSIS — Z1152 Encounter for screening for COVID-19: Secondary | ICD-10-CM

## 2022-11-29 DIAGNOSIS — Z7985 Long-term (current) use of injectable non-insulin antidiabetic drugs: Secondary | ICD-10-CM

## 2022-11-29 DIAGNOSIS — N1831 Chronic kidney disease, stage 3a: Secondary | ICD-10-CM | POA: Diagnosis not present

## 2022-11-29 DIAGNOSIS — E11621 Type 2 diabetes mellitus with foot ulcer: Secondary | ICD-10-CM | POA: Diagnosis present

## 2022-11-29 DIAGNOSIS — Z833 Family history of diabetes mellitus: Secondary | ICD-10-CM

## 2022-11-29 LAB — COMPREHENSIVE METABOLIC PANEL
ALT: 25 U/L (ref 0–44)
AST: 27 U/L (ref 15–41)
Albumin: 3.5 g/dL (ref 3.5–5.0)
Alkaline Phosphatase: 67 U/L (ref 38–126)
Anion gap: 12 (ref 5–15)
BUN: 37 mg/dL — ABNORMAL HIGH (ref 8–23)
CO2: 25 mmol/L (ref 22–32)
Calcium: 9.1 mg/dL (ref 8.9–10.3)
Chloride: 98 mmol/L (ref 98–111)
Creatinine, Ser: 2.48 mg/dL — ABNORMAL HIGH (ref 0.61–1.24)
GFR, Estimated: 28 mL/min — ABNORMAL LOW (ref >60)
Glucose, Bld: 127 mg/dL — ABNORMAL HIGH (ref 70–99)
Potassium: 4.6 mmol/L (ref 3.5–5.1)
Sodium: 135 mmol/L (ref 135–145)
Total Bilirubin: 0.4 mg/dL (ref 0.3–1.2)
Total Protein: 7.7 g/dL (ref 6.5–8.1)

## 2022-11-29 LAB — RESP PANEL BY RT-PCR (RSV, FLU A&B, COVID)  RVPGX2
Influenza A by PCR: NEGATIVE
Influenza B by PCR: NEGATIVE
Resp Syncytial Virus by PCR: NEGATIVE
SARS Coronavirus 2 by RT PCR: NEGATIVE

## 2022-11-29 LAB — I-STAT CHEM 8, ED
BUN: 33 mg/dL — ABNORMAL HIGH (ref 8–23)
Calcium, Ion: 1.11 mmol/L — ABNORMAL LOW (ref 1.15–1.40)
Chloride: 101 mmol/L (ref 98–111)
Creatinine, Ser: 2.6 mg/dL — ABNORMAL HIGH (ref 0.61–1.24)
Glucose, Bld: 128 mg/dL — ABNORMAL HIGH (ref 70–99)
HCT: 36 % — ABNORMAL LOW (ref 39.0–52.0)
Hemoglobin: 12.2 g/dL — ABNORMAL LOW (ref 13.0–17.0)
Potassium: 4.5 mmol/L (ref 3.5–5.1)
Sodium: 137 mmol/L (ref 135–145)
TCO2: 22 mmol/L (ref 22–32)

## 2022-11-29 LAB — URINALYSIS, W/ REFLEX TO CULTURE (INFECTION SUSPECTED)
Bilirubin Urine: NEGATIVE
Glucose, UA: NEGATIVE mg/dL
Hgb urine dipstick: NEGATIVE
Ketones, ur: NEGATIVE mg/dL
Nitrite: NEGATIVE
Protein, ur: 100 mg/dL — AB
Specific Gravity, Urine: 1.011 (ref 1.005–1.030)
pH: 6 (ref 5.0–8.0)

## 2022-11-29 LAB — BLOOD CULTURE ID PANEL (REFLEXED) - BCID2

## 2022-11-29 LAB — LACTIC ACID, PLASMA: Lactic Acid, Venous: 0.7 mmol/L (ref 0.5–1.9)

## 2022-11-29 LAB — CBC WITH DIFFERENTIAL/PLATELET
Abs Immature Granulocytes: 0.17 10*3/uL — ABNORMAL HIGH (ref 0.00–0.07)
Basophils Absolute: 0.1 10*3/uL (ref 0.0–0.1)
Basophils Relative: 1 %
Eosinophils Absolute: 0.4 10*3/uL (ref 0.0–0.5)
Eosinophils Relative: 2 %
HCT: 34.9 % — ABNORMAL LOW (ref 39.0–52.0)
Hemoglobin: 11.3 g/dL — ABNORMAL LOW (ref 13.0–17.0)
Immature Granulocytes: 1 %
Lymphocytes Relative: 14 %
Lymphs Abs: 2.3 10*3/uL (ref 0.7–4.0)
MCH: 28.3 pg (ref 26.0–34.0)
MCHC: 32.4 g/dL (ref 30.0–36.0)
MCV: 87.5 fL (ref 80.0–100.0)
Monocytes Absolute: 1.2 10*3/uL — ABNORMAL HIGH (ref 0.1–1.0)
Monocytes Relative: 7 %
Neutro Abs: 12.9 10*3/uL — ABNORMAL HIGH (ref 1.7–7.7)
Neutrophils Relative %: 75 %
Platelets: 312 10*3/uL (ref 150–400)
RBC: 3.99 MIL/uL — ABNORMAL LOW (ref 4.22–5.81)
RDW: 15 % (ref 11.5–15.5)
WBC: 17 10*3/uL — ABNORMAL HIGH (ref 4.0–10.5)
nRBC: 0 % (ref 0.0–0.2)

## 2022-11-29 LAB — HIV ANTIBODY (ROUTINE TESTING W REFLEX): HIV Screen 4th Generation wRfx: NONREACTIVE

## 2022-11-29 LAB — BLOOD GAS, VENOUS
Acid-Base Excess: 0.6 mmol/L (ref 0.0–2.0)
Bicarbonate: 26 mmol/L (ref 20.0–28.0)
O2 Saturation: 62.4 %
Patient temperature: 37
pCO2, Ven: 44 mmHg (ref 44–60)
pH, Ven: 7.38 (ref 7.25–7.43)
pO2, Ven: 34 mmHg (ref 32–45)

## 2022-11-29 LAB — LIPASE, BLOOD: Lipase: 39 U/L (ref 11–51)

## 2022-11-29 LAB — APTT: aPTT: 27 s (ref 24–36)

## 2022-11-29 LAB — TROPONIN I (HIGH SENSITIVITY)
Troponin I (High Sensitivity): 17 ng/L (ref <18)
Troponin I (High Sensitivity): 12 ng/L (ref <18)

## 2022-11-29 LAB — CBG MONITORING, ED: Glucose-Capillary: 118 mg/dL — ABNORMAL HIGH (ref 70–99)

## 2022-11-29 LAB — GLUCOSE, CAPILLARY: Glucose-Capillary: 73 mg/dL (ref 70–99)

## 2022-11-29 LAB — PROTIME-INR
INR: 1.1 (ref 0.8–1.2)
Prothrombin Time: 14.7 s (ref 11.4–15.2)

## 2022-11-29 LAB — HEMOGLOBIN A1C
Hgb A1c MFr Bld: 6.9 % — ABNORMAL HIGH (ref 4.8–5.6)
Mean Plasma Glucose: 151.33 mg/dL

## 2022-11-29 LAB — I-STAT CG4 LACTIC ACID, ED: Lactic Acid, Venous: 2.4 mmol/L (ref 0.5–1.9)

## 2022-11-29 MED ORDER — DONEPEZIL HCL 10 MG PO TABS
5.0000 mg | ORAL_TABLET | Freq: Every day | ORAL | Status: DC
  Administered 2022-11-30 – 2022-12-03 (×4): 5 mg via ORAL
  Filled 2022-11-29 (×5): qty 1

## 2022-11-29 MED ORDER — METRONIDAZOLE 500 MG/100ML IV SOLN
500.0000 mg | Freq: Once | INTRAVENOUS | Status: AC
  Administered 2022-11-29: 500 mg via INTRAVENOUS
  Filled 2022-11-29: qty 100

## 2022-11-29 MED ORDER — ATORVASTATIN CALCIUM 10 MG PO TABS
10.0000 mg | ORAL_TABLET | Freq: Every day | ORAL | Status: DC
  Administered 2022-11-30 – 2022-12-03 (×4): 10 mg via ORAL
  Filled 2022-11-29 (×4): qty 1

## 2022-11-29 MED ORDER — ESCITALOPRAM OXALATE 20 MG PO TABS
20.0000 mg | ORAL_TABLET | Freq: Every day | ORAL | Status: DC
  Administered 2022-11-30 – 2022-12-04 (×5): 20 mg via ORAL
  Filled 2022-11-29: qty 1
  Filled 2022-11-29: qty 2
  Filled 2022-11-29: qty 1
  Filled 2022-11-29: qty 2
  Filled 2022-11-29: qty 1

## 2022-11-29 MED ORDER — SODIUM CHLORIDE 0.9 % IV SOLN
2.0000 g | Freq: Once | INTRAVENOUS | Status: AC
  Administered 2022-11-29: 2 g via INTRAVENOUS
  Filled 2022-11-29: qty 12.5

## 2022-11-29 MED ORDER — ENSURE ENLIVE PO LIQD
237.0000 mL | Freq: Two times a day (BID) | ORAL | Status: DC
  Administered 2022-11-30 – 2022-12-04 (×6): 237 mL via ORAL
  Filled 2022-11-29: qty 237

## 2022-11-29 MED ORDER — INSULIN ASPART 100 UNIT/ML IJ SOLN
0.0000 [IU] | Freq: Every day | INTRAMUSCULAR | Status: DC
  Filled 2022-11-29: qty 0.05

## 2022-11-29 MED ORDER — BUPROPION HCL ER (XL) 150 MG PO TB24: 300.0000 mg | ORAL_TABLET | Freq: Every day | ORAL | Status: DC

## 2022-11-29 MED ORDER — LACTATED RINGERS IV SOLN: INTRAVENOUS | Status: AC

## 2022-11-29 MED ORDER — ACETAMINOPHEN 650 MG RE SUPP: 650.0000 mg | Freq: Four times a day (QID) | RECTAL | Status: DC | PRN

## 2022-11-29 MED ORDER — ALBUTEROL SULFATE (2.5 MG/3ML) 0.083% IN NEBU: 2.5000 mg | INHALATION_SOLUTION | RESPIRATORY_TRACT | Status: DC | PRN

## 2022-11-29 MED ORDER — PANTOPRAZOLE SODIUM 40 MG PO TBEC
40.0000 mg | DELAYED_RELEASE_TABLET | Freq: Every day | ORAL | Status: DC
  Administered 2022-11-30 – 2022-12-04 (×5): 40 mg via ORAL
  Filled 2022-11-29 (×5): qty 1

## 2022-11-29 MED ORDER — ACETAMINOPHEN 325 MG PO TABS
650.0000 mg | ORAL_TABLET | Freq: Four times a day (QID) | ORAL | Status: DC | PRN
  Administered 2022-11-30 – 2022-12-03 (×4): 650 mg via ORAL
  Filled 2022-11-29 (×4): qty 2

## 2022-11-29 MED ORDER — TRAZODONE HCL 50 MG PO TABS
25.0000 mg | ORAL_TABLET | Freq: Every evening | ORAL | Status: DC | PRN
  Administered 2022-11-30: 25 mg via ORAL
  Filled 2022-11-29: qty 1

## 2022-11-29 MED ORDER — INSULIN ASPART 100 UNIT/ML IJ SOLN
0.0000 [IU] | Freq: Three times a day (TID) | INTRAMUSCULAR | Status: DC
  Administered 2022-12-01 – 2022-12-02 (×3): 2 [IU] via SUBCUTANEOUS
  Filled 2022-11-29: qty 0.15

## 2022-11-29 MED ORDER — ONDANSETRON HCL 4 MG/2ML IJ SOLN: 4.0000 mg | Freq: Four times a day (QID) | INTRAMUSCULAR | Status: DC | PRN

## 2022-11-29 MED ORDER — VANCOMYCIN HCL IN DEXTROSE 1-5 GM/200ML-% IV SOLN
1000.0000 mg | Freq: Once | INTRAVENOUS | Status: AC
  Administered 2022-11-29: 1000 mg via INTRAVENOUS
  Filled 2022-11-29: qty 200

## 2022-11-29 MED ORDER — SODIUM CHLORIDE 0.9 % IV SOLN
2.0000 g | Freq: Four times a day (QID) | INTRAVENOUS | Status: DC
  Administered 2022-11-29 – 2022-11-30 (×3): 2 g via INTRAVENOUS
  Filled 2022-11-29 (×3): qty 2000

## 2022-11-29 MED ORDER — ONDANSETRON HCL 4 MG PO TABS: 4.0000 mg | ORAL_TABLET | Freq: Four times a day (QID) | ORAL | Status: DC | PRN

## 2022-11-29 MED ORDER — INFLUENZA VAC A&B SURF ANT ADJ 0.5 ML IM SUSY
0.5000 mL | PREFILLED_SYRINGE | INTRAMUSCULAR | Status: DC
  Filled 2022-11-29: qty 0.5

## 2022-11-29 MED ORDER — SODIUM CHLORIDE 0.9 % IV SOLN: INTRAVENOUS | Status: DC

## 2022-11-29 MED ORDER — HEPARIN SODIUM (PORCINE) 5000 UNIT/ML IJ SOLN
5000.0000 [IU] | Freq: Three times a day (TID) | INTRAMUSCULAR | Status: DC
  Administered 2022-11-29 – 2022-12-04 (×13): 5000 [IU] via SUBCUTANEOUS
  Filled 2022-11-29 (×14): qty 1

## 2022-11-29 MED ORDER — ENOXAPARIN SODIUM 40 MG/0.4ML IJ SOSY: 40.0000 mg | PREFILLED_SYRINGE | INTRAMUSCULAR | Status: DC

## 2022-11-29 MED ORDER — LACTATED RINGERS IV BOLUS (SEPSIS)
1000.0000 mL | Freq: Once | INTRAVENOUS | Status: AC
  Administered 2022-11-29: 1000 mL via INTRAVENOUS

## 2022-11-29 NOTE — Progress Notes (Signed)
[facility] Liaison Note  This is a pending hospice [facility] patient. Please call prior to discharge.   Please call with any questions or concerns. Thank you  Dionicio Stall, LCSW [facility] hospital liaison [phone]

## 2022-11-29 NOTE — Progress Notes (Signed)
A consult was received from an ED physician for vancomycin and cefepime per pharmacy dosing (for an indication other than meningitis). The patient's profile has been reviewed for ht/wt/allergies/indication/available labs. A one time order has been placed for the above antibiotics.  Further antibiotics/pharmacy consults should be ordered by admitting physician if indicated.                       Bernadene Person, PharmD, BCPS [phone] 11/29/2022, 2:08 PM

## 2022-11-29 NOTE — ED Provider Notes (Signed)
[place] EMERGENCY DEPARTMENT AT [facility] Provider Note   CSN: [id number] Arrival date & time: 11/29/22  1255     History  Chief Complaint  Patient presents with   Fatigue         Thomas Finley is a 67 y.o. male.  HPI 67 year old male history of dementia, type 2 diabetes, hypertension presenting for altered mental status.  Patient was seen here yesterday for hypotension from his facility.  At that time he had a CT scan of his abdomen which was unremarkable, he also had a urinalysis concerning for UTI.  He had mild AKI as well.  He was discharged home on antibiotics.  Patient's partner states he was called today due to positive blood culture in 1 of 2 bottles so he brought patient back.  On the way here patient acutely became confused.  Patient been complaining of pain all over but no focal pain.  On arrival here he is oriented to name but otherwise confused.  Partner states he is not as aware as he normally is.  Has not had any fevers as far as he knows, no head injury or trauma.     Home Medications Prior to Admission medications   Medication Sig Start Date End Date Taking? Authorizing Provider  atorvastatin (LIPITOR) 10 MG tablet Take 10 mg by mouth daily.    [provider]  blood glucose meter kit and supplies KIT Dispense based on patient and insurance preference. Use up to four times daily as directed. (FOR ICD-10 E11.65). 12/30/17   Roma Kayser, MD  Blood Glucose Monitoring Suppl (ACCU-CHEK GUIDE) w/Device KIT 1 Piece by Does not apply route as directed. 10/20/17   Roma Kayser, MD  buPROPion (WELLBUTRIN XL) 300 MG 24 hr tablet Take 300 mg by mouth daily. 04/27/20   [provider]  cephALEXin (KEFLEX) 500 MG capsule Take 1 capsule (500 mg total) by mouth 4 (four) times daily. 11/28/22   Bethann Berkshire, MD  cyanocobalamin 1000 MCG tablet Take by mouth.    [provider]  donepezil (ARICEPT) 5 MG tablet Take by mouth.  04/12/20   [provider]  doxycycline (ADOXA) 100 MG tablet Take 100 mg by mouth 2 (two) times daily. for 10 days 12/31/17   [provider]  Dulaglutide (TRULICITY) 1.5 MG/0.5ML SOPN Inject into the skin once a week. 09/30/19   [provider]  escitalopram (LEXAPRO) 20 MG tablet Take by mouth. 05/18/20   [provider]  fexofenadine (ALLEGRA) 180 MG tablet Take 180 mg by mouth daily.    [provider]  FLUoxetine (PROZAC) 20 MG tablet daily. 01/12/18   [provider]  FLUoxetine (PROZAC) 40 MG capsule 1 capsule DAILY (route: oral) 06/30/20   [provider]  glipiZIDE (GLUCOTROL) 10 MG tablet Take 5 mg by mouth 2 (two) times daily with a meal. 10/02/17   [provider]  glucose blood (ACCU-CHEK GUIDE) test strip Use as instructed 10/20/17   Roma Kayser, MD  glucose blood test strip 1 each by Other route as needed. Use as instructed 4 x daily. E11.65. One touch ultra 01/22/18   Roma Kayser, MD  hydrOXYzine (ATARAX/VISTARIL) 25 MG tablet Take 25 mg by mouth 2 (two) times daily. 02/23/20   [provider]  insulin degludec (TRESIBA FLEXTOUCH) 100 UNIT/ML SOPN FlexTouch Pen Inject 0.2 mLs (20 Units total) into the skin daily. 01/22/18   Roma Kayser, MD  insulin glargine, 2 Unit  Dial, (TOUJEO MAX SOLOSTAR) 300 UNIT/ML Solostar Pen Inject into the skin. 09/01/20   [provider]  Insulin Glargine-Lixisenatide 100-33 UNT-MCG/ML SOPN Inject into the skin. 05/17/20   [provider]  insulin lispro (HUMALOG) 100 UNIT/ML KwikPen Inject into the skin. 09/03/20   [provider]  Insulin Pen Needle (BD PEN NEEDLE NANO U/F) 32G X 4 MM MISC 1 each by Does not apply route at bedtime. 01/22/18   Roma Kayser, MD  lansoprazole (PREVACID) 30 MG capsule Take by mouth. 06/21/20 06/21/21  [provider]  linagliptin (TRADJENTA) 5 MG TABS tablet Take 1 tablet (5 mg total)  by mouth daily. 10/28/17   Roma Kayser, MD  lisinopril (PRINIVIL,ZESTRIL) 2.5 MG tablet Take 2.5 mg by mouth daily. 10/02/17   [provider]  metoCLOPramide (REGLAN) 10 MG tablet Take by mouth. 07/25/20   [provider]  Misc. Devices [facility] Bed Extension piece. 09/22/20   [provider]  pantoprazole (PROTONIX) 40 MG tablet Take by mouth.    [provider]  pioglitazone (ACTOS) 30 MG tablet Take by mouth. 05/17/20   [provider]      Allergies    Patient has no known allergies.    Review of Systems   Review of Systems  Unable to perform ROS: Mental status change    Physical Exam Updated Vital Signs BP 123/72   Pulse 92   Temp 98.5 F (36.9 C)   Resp 19   Ht 6\' 1"  (1.854 m)   Wt 87.1 kg   SpO2 98%   BMI 25.33 kg/m  Physical Exam Vitals and nursing note reviewed.  Constitutional:      Appearance: He is well-developed. He is ill-appearing.     Comments: Somnolent but arousable  HENT:     Head: Normocephalic and atraumatic.     Mouth/Throat:     Mouth: Mucous membranes are dry.     Pharynx: Oropharynx is clear.  Eyes:     Extraocular Movements: Extraocular movements intact.     Conjunctiva/sclera: Conjunctivae normal.     Pupils: Pupils are equal, round, and reactive to light.  Cardiovascular:     Rate and Rhythm: Normal rate and regular rhythm.     Heart sounds: No murmur heard. Pulmonary:     Effort: Pulmonary effort is normal. No respiratory distress.     Breath sounds: Normal breath sounds.  Abdominal:     Palpations: Abdomen is soft.     Tenderness: There is no abdominal tenderness. There is no guarding or rebound.  Musculoskeletal:        General: No swelling.     Cervical back: Neck supple. No rigidity.     Right lower leg: No edema.     Left lower leg: No edema.  Skin:    General: Skin is warm and dry.     Capillary Refill: Capillary refill takes less than 2 seconds.  Neurological:      General: No focal deficit present.     Mental Status: He is disoriented.     Cranial Nerves: No cranial nerve deficit.     Sensory: No sensory deficit.     Motor: No weakness.     Comments: Somnolent, oriented to name.  Denies any pain.  No gross cranial nerve deficits or facial asymmetry.  Normal speech.  Able to move all extremities antigravity.  And follow commands.  Psychiatric:        Mood and Affect: Mood normal.  ED Results / Procedures / Treatments   Labs (all labs ordered are listed, but only abnormal results are displayed) Labs Reviewed  COMPREHENSIVE METABOLIC PANEL - Abnormal; Notable for the following components:      Result Value   Glucose, Bld 127 (*)    BUN 37 (*)    Creatinine, Ser 2.48 (*)    GFR, Estimated 28 (*)    All other components within normal limits  CBC WITH DIFFERENTIAL/PLATELET - Abnormal; Notable for the following components:   WBC 17.0 (*)    RBC 3.99 (*)    Hemoglobin 11.3 (*)    HCT 34.9 (*)    Neutro Abs 12.9 (*)    Monocytes Absolute 1.2 (*)    Abs Immature Granulocytes 0.17 (*)    All other components within normal limits  URINALYSIS, W/ REFLEX TO CULTURE (INFECTION SUSPECTED) - Abnormal; Notable for the following components:   Protein, ur 100 (*)    Leukocytes,Ua TRACE (*)    Bacteria, UA RARE (*)    All other components within normal limits  CBG MONITORING, ED - Abnormal; Notable for the following components:   Glucose-Capillary 118 (*)    All other components within normal limits  I-STAT CG4 LACTIC ACID, ED - Abnormal; Notable for the following components:   Lactic Acid, Venous 2.4 (*)    All other components within normal limits  I-STAT CHEM 8, ED - Abnormal; Notable for the following components:   BUN 33 (*)    Creatinine, Ser 2.60 (*)    Glucose, Bld 128 (*)    Calcium, Ion 1.11 (*)    Hemoglobin 12.2 (*)    HCT 36.0 (*)    All other components within normal limits  RESP PANEL BY RT-PCR (RSV, FLU A&B, COVID)  [id number]   CULTURE, BLOOD (ROUTINE X 2)  CULTURE, BLOOD (ROUTINE X 2)  PROTIME-INR  APTT  LIPASE, BLOOD  BLOOD GAS, VENOUS  HIV ANTIBODY (ROUTINE TESTING W REFLEX)  HEMOGLOBIN A1C  BASIC METABOLIC PANEL  CBC  I-STAT CG4 LACTIC ACID, ED  TROPONIN I (HIGH SENSITIVITY)  TROPONIN I (HIGH SENSITIVITY)    EKG EKG Interpretation Date/Time:  Friday November 29 2022 13:45:43 EDT Ventricular Rate:  79 PR Interval:  175 QRS Duration:  93 QT Interval:  387 QTC Calculation: 444 R Axis:   61  Text Interpretation: Sinus rhythm Ventricular premature complex Confirmed by Fulton Reek [phone]) on 11/29/2022 1:53:01 PM  Radiology CT Head Wo Contrast  Result Date: 11/29/2022 CLINICAL DATA:  Mental status change of unknown cause. Found unresponsive by EMS. EXAM: CT HEAD WITHOUT CONTRAST TECHNIQUE: Contiguous axial images were obtained from the base of the skull through the vertex without intravenous contrast. RADIATION DOSE REDUCTION: This exam was performed according to the departmental dose-optimization program which includes automated exposure control, adjustment of the mA and/or kV according to patient size and/or use of iterative reconstruction technique. COMPARISON:  11/14/2022 FINDINGS: Brain: Ventricles, cisterns and other CSF spaces are within normal. There is chronic ischemic microvascular disease. There is no mass, mass effect, shift of midline structures or acute hemorrhage. No evidence of acute infarction. Vascular: No hyperdense vessel or unexpected calcification. Skull: Normal. Negative for fracture or focal lesion. Sinuses/Orbits: No acute finding. Other: None. IMPRESSION: 1. No acute findings. 2. Chronic ischemic microvascular disease. Electronically Signed   By: Elberta Fortis M.D.   On: 11/29/2022 17:07   DG Chest Port 1 View  Result Date: 11/29/2022 CLINICAL DATA:  Sepsis. EXAM: PORTABLE CHEST 1  VIEW COMPARISON:  X-ray 11/28/2022 FINDINGS: No consolidation, pneumothorax or effusion. No edema.  Normal cardiopericardial silhouette. Hand obscures the left costophrenic angle. Film is under penetrated. IMPRESSION: No acute cardiopulmonary disease. Electronically Signed   By: Karen Kays M.D.   On: 11/29/2022 16:58   CT ABDOMEN PELVIS WO CONTRAST  Result Date: 11/28/2022 CLINICAL DATA:  Kidney failure, acute Sepsis hypotension, AKI. EXAM: CT ABDOMEN AND PELVIS WITHOUT CONTRAST TECHNIQUE: Multidetector CT imaging of the abdomen and pelvis was performed following the standard protocol without IV contrast. RADIATION DOSE REDUCTION: This exam was performed according to the departmental dose-optimization program which includes automated exposure control, adjustment of the mA and/or kV according to patient size and/or use of iterative reconstruction technique. COMPARISON:  None Available. FINDINGS: Examination is limited due to patient's motion during data acquisition. Lower chest: There are patchy ground-glass changes in the bilateral lower lobes, which is nonspecific but can be seen with atypical infection or aspiration. There are multiple hyperattenuating nodules in the right lung lower lobe, posterior inferiorly, nonspecific but can be seen as a sequela of previous aspiration of hyperattenuating contents. No pleural effusion. The heart is normal in size. No pericardial effusion. Hepatobiliary: The liver is normal in size. Non-cirrhotic configuration. No suspicious mass. No intrahepatic or extrahepatic bile duct dilation. No calcified gallstones. Normal gallbladder wall thickness. No pericholecystic inflammatory changes. Pancreas: Unremarkable. No pancreatic ductal dilatation or surrounding inflammatory changes. Spleen: Within normal limits. No focal lesion. Adrenals/Urinary Tract: Adrenal glands are unremarkable. No suspicious renal mass. No hydronephrosis. There is a 4 mm nonobstructing calculus in the left kidney interpolar region. There are at least 2, punctate nonobstructing calculi in the right kidney.  No other nephroureterolithiasis on either side. Unremarkable urinary bladder. Stomach/Bowel: No disproportionate dilation of the small or large bowel loops. No evidence of abnormal bowel wall thickening or inflammatory changes. The appendix is unremarkable. There are multiple diverticula mainly in the sigmoid colon, without imaging signs of diverticulitis. Vascular/Lymphatic: No ascites or pneumoperitoneum. No abdominal or pelvic lymphadenopathy, by size criteria. No aneurysmal dilation of the major abdominal arteries. There are mild peripheral atherosclerotic vascular calcifications of the aorta and its major branches. Reproductive: Normal size prostate. Symmetric seminal vesicles. Other: There are small fat containing umbilical and left inguinal hernias. The soft tissues and abdominal wall are otherwise unremarkable. Musculoskeletal: No suspicious osseous lesions. There are mild multilevel degenerative changes in the visualized spine. IMPRESSION: 1. No acute inflammatory process identified within the abdomen or pelvis. 2. Bilateral nonobstructing nephrolithiasis. 3. Patchy ground-glass changes in the bilateral lower lobes, which is nonspecific but can be seen with atypical infection or aspiration. 4. Multiple other nonacute observations, as described above. Aortic Atherosclerosis (ICD10-I70.0). Electronically Signed   By: Jules Schick M.D.   On: 11/28/2022 16:22   DG Chest 2 View  Result Date: 11/28/2022 CLINICAL DATA:  Dementia.  Hypertension EXAM: CHEST - 2 VIEW COMPARISON:  X-ray 11/13/2022 FINDINGS: Stable cardiopericardial silhouette. No consolidation, pneumothorax or effusion. No edema. Overlapping cardiac leads. Film is under penetrated. IMPRESSION: No acute cardiopulmonary disease Electronically Signed   By: Karen Kays M.D.   On: 11/28/2022 13:52    Procedures .Critical Care  Performed by: Laurence Spates, MD Authorized by: Laurence Spates, MD   Critical care provider statement:     Critical care time (minutes):  35   Critical care was necessary to treat or prevent imminent or life-threatening deterioration of the following conditions:  Sepsis   Critical care was time spent personally  by me on the following activities:  Development of treatment plan with patient or surrogate, evaluation of patient's response to treatment, examination of patient, ordering and review of laboratory studies, ordering and review of radiographic studies, ordering and performing treatments and interventions, pulse oximetry, re-evaluation of patient's condition and review of old charts   Care discussed with: admitting provider       Medications Ordered in ED Medications  lactated ringers infusion ( Intravenous New Bag/Given 11/29/22 1449)  ampicillin (OMNIPEN) 2 g in sodium chloride 0.9 % 100 mL IVPB (has no administration in time range)  atorvastatin (LIPITOR) tablet 10 mg (has no administration in time range)  donepezil (ARICEPT) tablet 5 mg (has no administration in time range)  escitalopram (LEXAPRO) tablet 20 mg (has no administration in time range)  pantoprazole (PROTONIX) EC tablet 40 mg (has no administration in time range)  insulin aspart (novoLOG) injection 0-15 Units (has no administration in time range)  insulin aspart (novoLOG) injection 0-5 Units (has no administration in time range)  acetaminophen (TYLENOL) tablet 650 mg (has no administration in time range)    Or  acetaminophen (TYLENOL) suppository 650 mg (has no administration in time range)  0.9 %  sodium chloride infusion (has no administration in time range)  traZODone (DESYREL) tablet 25 mg (has no administration in time range)  ondansetron (ZOFRAN) tablet 4 mg (has no administration in time range)    Or  ondansetron (ZOFRAN) injection 4 mg (has no administration in time range)  albuterol (PROVENTIL) (2.5 MG/3ML) 0.083% nebulizer solution 2.5 mg (has no administration in time range)  ceFEPIme (MAXIPIME) 2 g in sodium  chloride 0.9 % 100 mL IVPB (0 g Intravenous Stopped 11/29/22 1658)  metroNIDAZOLE (FLAGYL) IVPB 500 mg (0 mg Intravenous Stopped 11/29/22 1658)  vancomycin (VANCOCIN) IVPB 1000 mg/200 mL premix (0 mg Intravenous Stopped 11/29/22 1659)  lactated ringers bolus 1,000 mL (1,000 mLs Intravenous New Bag/Given 11/29/22 1449)    ED Course/ Medical Decision Making/ A&P                                 Medical Decision Making Amount and/or Complexity of Data Reviewed Labs: ordered. Radiology: ordered. ECG/medicine tests: ordered.  Risk Prescription drug management. Decision regarding hospitalization.   Medical Decision Making:   Delancey Talamo is a 67 y.o. male who presented to the ED today with altered mental status and positive blood culture.  On arrival he was initially hypotensive although improved on repeat evaluation.  He is encephalopathic and confused.  He was seen yesterday for UTI and had positive blood culture.  I am concerned for sepsis.  Sepsis protocol initiated including antibiotics, IV fluids.  Lactic acid slightly elevated 2.4.  He remains normotensive.  After fluids, he does have improvement in mentation.  Blood sugar is normal.  Will obtain CT head to rule intracranial cause.  Abdominal imaging yesterday which was unremarkable, I do not think he needs repeat abdominal imaging at this time.  I suspect he has urosepsis.   Patient placed on continuous vitals and telemetry monitoring while in ED which was reviewed periodically.  Reviewed and confirmed nursing documentation for past medical history, family history, social history.   Reassessment and Plan:   Workup.  Notable for slightly improving AKI, leukocytosis.  Lactic acid slightly elevated.  Urinalysis is still concerning for urinary tract infection.  CT head, chest x-ray unremarkable.  Discussed with hospitalist and admitted for workup  of sepsis.   Patient's presentation is most consistent with acute presentation with potential  threat to life or bodily function.           Final Clinical Impression(s) / ED Diagnoses Final diagnoses:  Sepsis, due to unspecified organism, unspecified whether acute organ dysfunction present [facility])    Rx / DC Orders ED Discharge Orders     None         Laurence Spates, MD 11/29/22 [phone]

## 2022-11-29 NOTE — Sepsis Progress Note (Signed)
Notified bedside nurse of need to draw repeat lactic acid. 

## 2022-11-29 NOTE — Consult Note (Signed)
[facility]  Total days of antibiotics 1/ cefepime, metro, and vancomycin Reason for Consult:aMS and enterococcal bacteremia   Referring Physician: davis  Active Problems:   * No active hospital problems. *    HPI: Thomas Finley is a 67 y.o. male with hx of T2DM c/b diabetic retinopathy, right foot DFU , early dementia, nursing home resident, has male partner. Who was brought in from facility for hypotension of unclear reason, appeared transient. WBC at 16 in addition to Cr 3 ( up from 2.39). work up didn't suggest uti, he was given ivf, keflex for possible and subsequently set back to SNF. Blood cx grew e.faecalis. he was called back for admission. He is still doesn't appear to be at his baseline per his partner. His partner mentioned that he had occurrence of nausea and vomiting.  Broad spectrum abtx started.   Past Medical History:  Diagnosis Date   Diabetes (HCC)    Diabetic retinopathy (HCC)    NPDR OU   Hypertension    Hypertensive retinopathy    OU    Allergies: No Known Allergies   MEDICATIONS:   Social History   Tobacco Use   Smoking status: Never   Smokeless tobacco: Never  Vaping Use   Vaping status: Never Used  Substance Use Topics   Alcohol use: Never   Drug use: Never    Family History  Problem Relation Age of Onset   Diabetes Father    Diabetes Sister    Diabetes Brother     Review of Systems -  Unable to obtain due to dementia  OBJECTIVE: Temp:  [97.5 F (36.4 C)-99.5 F (37.5 C)] 98.7 F (37.1 C) (09/06 1405) Pulse Rate:  [80-96] 92 (09/06 1416) Resp:  [14-19] 19 (09/06 1350) BP: (86-158)/(54-78) 123/72 (09/06 1416) SpO2:  [98 %-100 %] 98 % (09/06 1416) Weight:  [87.1 kg] 87.1 kg (09/06 1506) Physical Exam  Constitutional: He is oriented to person, place, and time. He appears well-developed and well-nourished. No distress.  HENT:  Mouth/Throat: Oropharynx is clear and moist. No oropharyngeal exudate.   Cardiovascular: Normal rate, regular rhythm and normal heart sounds. Exam reveals no gallop and no friction rub.  No murmur heard.  Pulmonary/Chest: Effort normal and breath sounds normal. No respiratory distress. He has no wheezes.  Abdominal: Soft. Bowel sounds are normal. He exhibits no distension. There is no tenderness.  Lymphadenopathy:  He has no cervical adenopathy.  Neurological: He is alert and oriented to person, place, and time.  Skin: Skin is warm and dry. No rash noted. But has left 1st MTH plantar ulcer Psychiatric: He has a normal mood and affect. His behavior is normal.    LABS: Results for orders placed or performed during the hospital encounter of 11/29/22 (from the past 48 hour(s))  CBG monitoring, ED     Status: Abnormal   Collection Time: 11/29/22  1:41 PM  Result Value Ref Range   Glucose-Capillary 118 (H) 70 - 99 mg/dL    Comment: Glucose reference range applies only to samples taken after fasting for at least 8 hours.  Resp panel by RT-PCR (RSV, Flu A&B, Covid) Peripheral     Status: None   Collection Time: 11/29/22  1:47 PM   Specimen: Peripheral; Nasal Swab  Result Value Ref Range   SARS Coronavirus 2 by RT PCR NEGATIVE NEGATIVE    Comment: (NOTE) SARS-CoV-2 target nucleic acids are NOT DETECTED.  The SARS-CoV-2 RNA is generally detectable in upper  respiratory specimens during the acute phase of infection. The lowest concentration of SARS-CoV-2 viral copies this assay can detect is 138 copies/mL. A negative result does not preclude SARS-Cov-2 infection and should not be used as the sole basis for treatment or other patient management decisions. A negative result may occur with  improper specimen collection/handling, submission of specimen other than nasopharyngeal swab, presence of viral mutation(s) within the areas targeted by this assay, and inadequate number of viral copies(<138 copies/mL). A negative result must be combined with clinical  observations, patient history, and epidemiological information. The expected result is Negative.  Fact Sheet for Patients:  BloggerCourse.com  Fact Sheet for Healthcare Providers:  SeriousBroker.it  This test is no t yet approved or cleared by the [place] FDA and  has been authorized for detection and/or diagnosis of SARS-CoV-2 by FDA under an Emergency Use Authorization (EUA). This EUA will remain  in effect (meaning this test can be used) for the duration of the COVID-19 declaration under Section 564(b)(1) of the Act, 21 U.S.C.section 360bbb-3(b)(1), unless the authorization is terminated  or revoked sooner.       Influenza A by PCR NEGATIVE NEGATIVE   Influenza B by PCR NEGATIVE NEGATIVE    Comment: (NOTE) The Xpert Xpress SARS-CoV-2/FLU/RSV plus assay is intended as an aid in the diagnosis of influenza from Nasopharyngeal swab specimens and should not be used as a sole basis for treatment. Nasal washings and aspirates are unacceptable for Xpert Xpress SARS-CoV-2/FLU/RSV testing.  Fact Sheet for Patients: BloggerCourse.com  Fact Sheet for Healthcare Providers: SeriousBroker.it  This test is not yet approved or cleared by the [place] FDA and has been authorized for detection and/or diagnosis of SARS-CoV-2 by FDA under an Emergency Use Authorization (EUA). This EUA will remain in effect (meaning this test can be used) for the duration of the COVID-19 declaration under Section 564(b)(1) of the Act, 21 U.S.C. section 360bbb-3(b)(1), unless the authorization is terminated or revoked.     Resp Syncytial Virus by PCR NEGATIVE NEGATIVE    Comment: (NOTE) Fact Sheet for Patients: BloggerCourse.com  Fact Sheet for Healthcare Providers: SeriousBroker.it  This test is not yet approved or cleared by the [place]  FDA and has been authorized for detection and/or diagnosis of SARS-CoV-2 by FDA under an Emergency Use Authorization (EUA). This EUA will remain in effect (meaning this test can be used) for the duration of the COVID-19 declaration under Section 564(b)(1) of the Act, 21 U.S.C. section 360bbb-3(b)(1), unless the authorization is terminated or revoked.  Performed at [facility], [address], [place], [place] [address]   Comprehensive metabolic panel     Status: Abnormal   Collection Time: 11/29/22  1:54 PM  Result Value Ref Range   Sodium 135 135 - 145 mmol/L   Potassium 4.6 3.5 - 5.1 mmol/L   Chloride 98 98 - 111 mmol/L   CO2 25 22 - 32 mmol/L   Glucose, Bld 127 (H) 70 - 99 mg/dL    Comment: Glucose reference range applies only to samples taken after fasting for at least 8 hours.   BUN 37 (H) 8 - 23 mg/dL   Creatinine, Ser 6.64 (H) 0.61 - 1.24 mg/dL   Calcium 9.1 8.9 - 40.3 mg/dL   Total Protein 7.7 6.5 - 8.1 g/dL   Albumin 3.5 3.5 - 5.0 g/dL   AST 27 15 - 41 U/L   ALT 25 0 - 44 U/L   Alkaline Phosphatase 67 38 - 126  U/L   Total Bilirubin 0.4 0.3 - 1.2 mg/dL   GFR, Estimated 28 (L) >60 mL/min    Comment: (NOTE) Calculated using the CKD-EPI Creatinine Equation (2021)    Anion gap 12 5 - 15    Comment: Performed at [facility], [address] [place], [place] [address]  CBC with Differential     Status: Abnormal   Collection Time: 11/29/22  1:54 PM  Result Value Ref Range   WBC 17.0 (H) 4.0 - 10.5 K/uL   RBC 3.99 (L) 4.22 - 5.81 MIL/uL   Hemoglobin 11.3 (L) 13.0 - 17.0 g/dL   HCT 62.1 (L) 30.8 - 65.7 %   MCV 87.5 80.0 - 100.0 fL   MCH 28.3 26.0 - 34.0 pg   MCHC 32.4 30.0 - 36.0 g/dL   RDW 84.6 96.2 - 95.2 %   Platelets 312 150 - 400 K/uL   nRBC 0.0 0.0 - 0.2 %   Neutrophils Relative % 75 %   Neutro Abs 12.9 (H) 1.7 - 7.7 K/uL   Lymphocytes Relative 14 %   Lymphs Abs 2.3 0.7 - 4.0 K/uL   Monocytes Relative 7 %   Monocytes  Absolute 1.2 (H) 0.1 - 1.0 K/uL   Eosinophils Relative 2 %   Eosinophils Absolute 0.4 0.0 - 0.5 K/uL   Basophils Relative 1 %   Basophils Absolute 0.1 0.0 - 0.1 K/uL   Immature Granulocytes 1 %   Abs Immature Granulocytes 0.17 (H) 0.00 - 0.07 K/uL    Comment: Performed at [facility], [address] [place], [place] [address]  Protime-INR     Status: None   Collection Time: 11/29/22  1:54 PM  Result Value Ref Range   Prothrombin Time 14.7 11.4 - 15.2 seconds   INR 1.1 0.8 - 1.2    Comment: (NOTE) INR goal varies based on device and disease states. Performed at [facility], [address] [place], [place] [address]   APTT     Status: None   Collection Time: 11/29/22  1:54 PM  Result Value Ref Range   aPTT 27 24 - 36 seconds    Comment: Performed at [facility], [address] [place], [place] [address]  Lipase, blood     Status: None   Collection Time: 11/29/22  1:54 PM  Result Value Ref Range   Lipase 39 11 - 51 U/L    Comment: Performed at [facility], [address], [place], [place] [address]  Troponin I (High Sensitivity)     Status: None   Collection Time: 11/29/22  1:54 PM  Result Value Ref Range   Troponin I (High Sensitivity) 17 <18 ng/L    Comment: (NOTE) Elevated high sensitivity troponin I (hsTnI) values and significant  changes across serial measurements may suggest ACS but many other  chronic and acute conditions are known to elevate hsTnI results.  Refer to the "Links" section for chest pain algorithms and additional  guidance. Performed at [facility], [address] [place], [place] [address]   I-Stat Lactic Acid, ED     Status: Abnormal   Collection Time: 11/29/22  2:07 PM  Result Value Ref Range   Lactic Acid, Venous 2.4 (HH) 0.5 - 1.9 mmol/L  I-stat chem 8, ED     Status: Abnormal   Collection Time: 11/29/22  2:07 PM  Result Value Ref Range    Sodium 137 135 - 145 mmol/L  Potassium 4.5 3.5 - 5.1 mmol/L   Chloride 101 98 - 111 mmol/L   BUN 33 (H) 8 - 23 mg/dL   Creatinine, Ser 4.09 (H) 0.61 - 1.24 mg/dL   Glucose, Bld 811 (H) 70 - 99 mg/dL    Comment: Glucose reference range applies only to samples taken after fasting for at least 8 hours.   Calcium, Ion 1.11 (L) 1.15 - 1.40 mmol/L   TCO2 22 22 - 32 mmol/L   Hemoglobin 12.2 (L) 13.0 - 17.0 g/dL   HCT 91.4 (L) 78.2 - 95.6 %  Urinalysis, w/ Reflex to Culture (Infection Suspected) -Urine, Catheterized     Status: Abnormal   Collection Time: 11/29/22  2:20 PM  Result Value Ref Range   Specimen Source URINE, CATHETERIZED    Color, Urine YELLOW YELLOW   APPearance CLEAR CLEAR   Specific Gravity, Urine 1.011 1.005 - 1.030   pH 6.0 5.0 - 8.0   Glucose, UA NEGATIVE NEGATIVE mg/dL   Hgb urine dipstick NEGATIVE NEGATIVE   Bilirubin Urine NEGATIVE NEGATIVE   Ketones, ur NEGATIVE NEGATIVE mg/dL   Protein, ur 213 (A) NEGATIVE mg/dL   Nitrite NEGATIVE NEGATIVE   Leukocytes,Ua TRACE (A) NEGATIVE   RBC / HPF 0-5 0 - 5 RBC/hpf   WBC, UA 6-10 0 - 5 WBC/hpf    Comment:        Reflex urine culture not performed if WBC <=10, OR if Squamous epithelial cells >5. If Squamous epithelial cells >5 suggest recollection.    Bacteria, UA RARE (A) NONE SEEN   Squamous Epithelial / HPF 0-5 0 - 5 /HPF   Hyaline Casts, UA PRESENT    Sperm, UA PRESENT     Comment: Performed at [facility], [address] [place], [place] [address]  Blood gas, venous     Status: None   Collection Time: 11/29/22  3:11 PM  Result Value Ref Range   pH, Ven 7.38 7.25 - 7.43   pCO2, Ven 44 44 - 60 mmHg   pO2, Ven 34 32 - 45 mmHg   Bicarbonate 26.0 20.0 - 28.0 mmol/L   Acid-Base Excess 0.6 0.0 - 2.0 mmol/L   O2 Saturation 62.4 %   Patient temperature 37.0     Comment: Performed at [facility], [address] [place], [place] [address]    MICRO:  IMAGING: CT ABDOMEN  PELVIS WO CONTRAST  Result Date: 11/28/2022 CLINICAL DATA:  Kidney failure, acute Sepsis hypotension, AKI. EXAM: CT ABDOMEN AND PELVIS WITHOUT CONTRAST TECHNIQUE: Multidetector CT imaging of the abdomen and pelvis was performed following the standard protocol without IV contrast. RADIATION DOSE REDUCTION: This exam was performed according to the departmental dose-optimization program which includes automated exposure control, adjustment of the mA and/or kV according to patient size and/or use of iterative reconstruction technique. COMPARISON:  None Available. FINDINGS: Examination is limited due to patient's motion during data acquisition. Lower chest: There are patchy ground-glass changes in the bilateral lower lobes, which is nonspecific but can be seen with atypical infection or aspiration. There are multiple hyperattenuating nodules in the right lung lower lobe, posterior inferiorly, nonspecific but can be seen as a sequela of previous aspiration of hyperattenuating contents. No pleural effusion. The heart is normal in size. No pericardial effusion. Hepatobiliary: The liver is normal in size. Non-cirrhotic configuration. No suspicious mass. No intrahepatic or extrahepatic bile duct dilation. No calcified gallstones. Normal gallbladder wall thickness. No pericholecystic inflammatory changes. Pancreas: Unremarkable. No pancreatic ductal  dilatation or surrounding inflammatory changes. Spleen: Within normal limits. No focal lesion. Adrenals/Urinary Tract: Adrenal glands are unremarkable. No suspicious renal mass. No hydronephrosis. There is a 4 mm nonobstructing calculus in the left kidney interpolar region. There are at least 2, punctate nonobstructing calculi in the right kidney. No other nephroureterolithiasis on either side. Unremarkable urinary bladder. Stomach/Bowel: No disproportionate dilation of the small or large bowel loops. No evidence of abnormal bowel wall thickening or inflammatory changes. The  appendix is unremarkable. There are multiple diverticula mainly in the sigmoid colon, without imaging signs of diverticulitis. Vascular/Lymphatic: No ascites or pneumoperitoneum. No abdominal or pelvic lymphadenopathy, by size criteria. No aneurysmal dilation of the major abdominal arteries. There are mild peripheral atherosclerotic vascular calcifications of the aorta and its major branches. Reproductive: Normal size prostate. Symmetric seminal vesicles. Other: There are small fat containing umbilical and left inguinal hernias. The soft tissues and abdominal wall are otherwise unremarkable. Musculoskeletal: No suspicious osseous lesions. There are mild multilevel degenerative changes in the visualized spine. IMPRESSION: 1. No acute inflammatory process identified within the abdomen or pelvis. 2. Bilateral nonobstructing nephrolithiasis. 3. Patchy ground-glass changes in the bilateral lower lobes, which is nonspecific but can be seen with atypical infection or aspiration. 4. Multiple other nonacute observations, as described above. Aortic Atherosclerosis (ICD10-I70.0). Electronically Signed   By: Jules Schick M.D.   On: 11/28/2022 16:22   DG Chest 2 View  Result Date: 11/28/2022 CLINICAL DATA:  Dementia.  Hypertension EXAM: CHEST - 2 VIEW COMPARISON:  X-ray 11/13/2022 FINDINGS: Stable cardiopericardial silhouette. No consolidation, pneumothorax or effusion. No edema. Overlapping cardiac leads. Film is under penetrated. IMPRESSION: No acute cardiopulmonary disease Electronically Signed   By: Karen Kays M.D.   On: 11/28/2022 13:52    HISTORICAL MICRO/IMAGING  Assessment/Plan:   67yo M with T2DM, advanced dementia for age, who was found to have fever, hypotension and found to have enterococcal bacteremia  - recommend to narrow abtx to ampicillin - will check blood cx - will recommend to start with TTE. Then TEE to evaluate source  Of infection - may consider right foot MRI to see if source of  osteomeylitis  More recs to foloow. Dr Odette Fraction to see over the weekend.

## 2022-11-29 NOTE — ED Notes (Signed)
ED TO INPATIENT HANDOFF REPORT  ED Nurse Name and Phone #: Crist Infante RN [phone]  S Name/Age/Gender Thomas Finley 67 y.o. male Room/Bed: RESB/RESB  Code Status   Code Status: Not on file  Home/SNF/Other Skilled nursing facility Patient oriented to: self and situation Is this baseline? No   Triage Complete: Triage complete  Chief Complaint Fatigue  Triage Note Patient brought in by EMS from [facility] for unresponsiveness.    Allergies No Known Allergies  Level of Care/Admitting Diagnosis ED Disposition     None       B Medical/Surgery History Past Medical History:  Diagnosis Date   Diabetes (HCC)    Diabetic retinopathy (HCC)    NPDR OU   Hypertension    Hypertensive retinopathy    OU   Past Surgical History:  Procedure Laterality Date   CATARACT EXTRACTION Bilateral    Dr. Elmer Picker   EYE SURGERY Bilateral    Cat Sx - Dr. Reina Fuse IV Location/Drains/Wounds Patient Lines/Drains/Airways Status     Active Line/Drains/Airways     Name Placement date Placement time Site Days   Peripheral IV 11/29/22 20 G Right Antecubital 11/29/22  1425  Antecubital  less than 1   Peripheral IV 11/29/22 20 G 1" Right;Posterior Hand 11/29/22  1448  Hand  less than 1   Peripheral IV 11/29/22 22 G 1" Anterior;Distal;Left Forearm 11/29/22  1458  Forearm  less than 1            Intake/Output Last 24 hours No intake or output data in the 24 hours ending 11/29/22 1640  Labs/Imaging Results for orders placed or performed during the hospital encounter of 11/29/22 (from the past 48 hour(s))  CBG monitoring, ED     Status: Abnormal   Collection Time: 11/29/22  1:41 PM  Result Value Ref Range   Glucose-Capillary 118 (H) 70 - 99 mg/dL    Comment: Glucose reference range applies only to samples taken after fasting for at least 8 hours.  Resp panel by RT-PCR (RSV, Flu A&B, Covid) Peripheral     Status: None   Collection Time: 11/29/22  1:47 PM   Specimen:  Peripheral; Nasal Swab  Result Value Ref Range   SARS Coronavirus 2 by RT PCR NEGATIVE NEGATIVE    Comment: (NOTE) SARS-CoV-2 target nucleic acids are NOT DETECTED.  The SARS-CoV-2 RNA is generally detectable in upper respiratory specimens during the acute phase of infection. The lowest concentration of SARS-CoV-2 viral copies this assay can detect is 138 copies/mL. A negative result does not preclude SARS-Cov-2 infection and should not be used as the sole basis for treatment or other patient management decisions. A negative result may occur with  improper specimen collection/handling, submission of specimen other than nasopharyngeal swab, presence of viral mutation(s) within the areas targeted by this assay, and inadequate number of viral copies(<138 copies/mL). A negative result must be combined with clinical observations, patient history, and epidemiological information. The expected result is Negative.  Fact Sheet for Patients:  BloggerCourse.com  Fact Sheet for Healthcare Providers:  SeriousBroker.it  This test is no t yet approved or cleared by the [place] FDA and  has been authorized for detection and/or diagnosis of SARS-CoV-2 by FDA under an Emergency Use Authorization (EUA). This EUA will remain  in effect (meaning this test can be used) for the duration of the COVID-19 declaration under Section 564(b)(1) of the Act, 21 U.S.C.section 360bbb-3(b)(1), unless the authorization is terminated  or revoked  sooner.       Influenza A by PCR NEGATIVE NEGATIVE   Influenza B by PCR NEGATIVE NEGATIVE    Comment: (NOTE) The Xpert Xpress SARS-CoV-2/FLU/RSV plus assay is intended as an aid in the diagnosis of influenza from Nasopharyngeal swab specimens and should not be used as a sole basis for treatment. Nasal washings and aspirates are unacceptable for Xpert Xpress SARS-CoV-2/FLU/RSV testing.  Fact Sheet for  Patients: BloggerCourse.com  Fact Sheet for Healthcare Providers: SeriousBroker.it  This test is not yet approved or cleared by the [place] FDA and has been authorized for detection and/or diagnosis of SARS-CoV-2 by FDA under an Emergency Use Authorization (EUA). This EUA will remain in effect (meaning this test can be used) for the duration of the COVID-19 declaration under Section 564(b)(1) of the Act, 21 U.S.C. section 360bbb-3(b)(1), unless the authorization is terminated or revoked.     Resp Syncytial Virus by PCR NEGATIVE NEGATIVE    Comment: (NOTE) Fact Sheet for Patients: BloggerCourse.com  Fact Sheet for Healthcare Providers: SeriousBroker.it  This test is not yet approved or cleared by the [place] FDA and has been authorized for detection and/or diagnosis of SARS-CoV-2 by FDA under an Emergency Use Authorization (EUA). This EUA will remain in effect (meaning this test can be used) for the duration of the COVID-19 declaration under Section 564(b)(1) of the Act, 21 U.S.C. section 360bbb-3(b)(1), unless the authorization is terminated or revoked.  Performed at [facility], [address], [place], [place] [address]   Comprehensive metabolic panel     Status: Abnormal   Collection Time: 11/29/22  1:54 PM  Result Value Ref Range   Sodium 135 135 - 145 mmol/L   Potassium 4.6 3.5 - 5.1 mmol/L   Chloride 98 98 - 111 mmol/L   CO2 25 22 - 32 mmol/L   Glucose, Bld 127 (H) 70 - 99 mg/dL    Comment: Glucose reference range applies only to samples taken after fasting for at least 8 hours.   BUN 37 (H) 8 - 23 mg/dL   Creatinine, Ser 2.84 (H) 0.61 - 1.24 mg/dL   Calcium 9.1 8.9 - 13.2 mg/dL   Total Protein 7.7 6.5 - 8.1 g/dL   Albumin 3.5 3.5 - 5.0 g/dL   AST 27 15 - 41 U/L   ALT 25 0 - 44 U/L   Alkaline Phosphatase 67 38 - 126 U/L   Total  Bilirubin 0.4 0.3 - 1.2 mg/dL   GFR, Estimated 28 (L) >60 mL/min    Comment: (NOTE) Calculated using the CKD-EPI Creatinine Equation (2021)    Anion gap 12 5 - 15    Comment: Performed at [facility], [address], [place], [place] [address]  CBC with Differential     Status: Abnormal   Collection Time: 11/29/22  1:54 PM  Result Value Ref Range   WBC 17.0 (H) 4.0 - 10.5 K/uL   RBC 3.99 (L) 4.22 - 5.81 MIL/uL   Hemoglobin 11.3 (L) 13.0 - 17.0 g/dL   HCT 27.2 (L) 53.6 - 64.4 %   MCV 87.5 80.0 - 100.0 fL   MCH 28.3 26.0 - 34.0 pg   MCHC 32.4 30.0 - 36.0 g/dL   RDW 03.4 74.2 - 59.5 %   Platelets 312 150 - 400 K/uL   nRBC 0.0 0.0 - 0.2 %   Neutrophils Relative % 75 %   Neutro Abs 12.9 (H) 1.7 - 7.7 K/uL   Lymphocytes Relative 14 %  Lymphs Abs 2.3 0.7 - 4.0 K/uL   Monocytes Relative 7 %   Monocytes Absolute 1.2 (H) 0.1 - 1.0 K/uL   Eosinophils Relative 2 %   Eosinophils Absolute 0.4 0.0 - 0.5 K/uL   Basophils Relative 1 %   Basophils Absolute 0.1 0.0 - 0.1 K/uL   Immature Granulocytes 1 %   Abs Immature Granulocytes 0.17 (H) 0.00 - 0.07 K/uL    Comment: Performed at [facility], [address] [place], [place] [address]  Protime-INR     Status: None   Collection Time: 11/29/22  1:54 PM  Result Value Ref Range   Prothrombin Time 14.7 11.4 - 15.2 seconds   INR 1.1 0.8 - 1.2    Comment: (NOTE) INR goal varies based on device and disease states. Performed at [facility], [address], [place], [place] [address]   APTT     Status: None   Collection Time: 11/29/22  1:54 PM  Result Value Ref Range   aPTT 27 24 - 36 seconds    Comment: Performed at [facility], [address], [place], [place] [address]  Lipase, blood     Status: None   Collection Time: 11/29/22  1:54 PM  Result Value Ref Range   Lipase 39 11 - 51 U/L    Comment: Performed at [facility], [address] [place], [place] [address]  Troponin I (High Sensitivity)     Status: None   Collection Time: 11/29/22  1:54 PM  Result Value Ref Range   Troponin I (High Sensitivity) 17 <18 ng/L    Comment: (NOTE) Elevated high sensitivity troponin I (hsTnI) values and significant  changes across serial measurements may suggest ACS but many other  chronic and acute conditions are known to elevate hsTnI results.  Refer to the "Links" section for chest pain algorithms and additional  guidance. Performed at [facility], [address] [place], [place] [address]   I-Stat Lactic Acid, ED     Status: Abnormal   Collection Time: 11/29/22  2:07 PM  Result Value Ref Range   Lactic Acid, Venous 2.4 (HH) 0.5 - 1.9 mmol/L  I-stat chem 8, ED     Status: Abnormal   Collection Time: 11/29/22  2:07 PM  Result Value Ref Range   Sodium 137 135 - 145 mmol/L   Potassium 4.5 3.5 - 5.1 mmol/L   Chloride 101 98 - 111 mmol/L   BUN 33 (H) 8 - 23 mg/dL   Creatinine, Ser 0.10 (H) 0.61 - 1.24 mg/dL   Glucose, Bld 272 (H) 70 - 99 mg/dL    Comment: Glucose reference range applies only to samples taken after fasting for at least 8 hours.   Calcium, Ion 1.11 (L) 1.15 - 1.40 mmol/L   TCO2 22 22 - 32 mmol/L   Hemoglobin 12.2 (L) 13.0 - 17.0 g/dL   HCT 53.6 (L) 64.4 - 03.4 %  Urinalysis, w/ Reflex to Culture (Infection Suspected) -Urine, Catheterized     Status: Abnormal   Collection Time: 11/29/22  2:20 PM  Result Value Ref Range   Specimen Source URINE, CATHETERIZED    Color, Urine YELLOW YELLOW   APPearance CLEAR CLEAR   Specific Gravity, Urine 1.011 1.005 - 1.030   pH 6.0 5.0 - 8.0   Glucose, UA NEGATIVE NEGATIVE mg/dL   Hgb urine dipstick NEGATIVE NEGATIVE   Bilirubin Urine NEGATIVE NEGATIVE   Ketones, ur NEGATIVE NEGATIVE mg/dL  Protein, ur 100 (A) NEGATIVE mg/dL   Nitrite NEGATIVE NEGATIVE   Leukocytes,Ua TRACE (A) NEGATIVE   RBC / HPF 0-5 0 - 5 RBC/hpf   WBC, UA 6-10 0 - 5 WBC/hpf     Comment:        Reflex urine culture not performed if WBC <=10, OR if Squamous epithelial cells >5. If Squamous epithelial cells >5 suggest recollection.    Bacteria, UA RARE (A) NONE SEEN   Squamous Epithelial / HPF 0-5 0 - 5 /HPF   Hyaline Casts, UA PRESENT    Sperm, UA PRESENT     Comment: Performed at [facility], [address], [place], [place] [address]  Blood gas, venous     Status: None   Collection Time: 11/29/22  3:11 PM  Result Value Ref Range   pH, Ven 7.38 7.25 - 7.43   pCO2, Ven 44 44 - 60 mmHg   pO2, Ven 34 32 - 45 mmHg   Bicarbonate 26.0 20.0 - 28.0 mmol/L   Acid-Base Excess 0.6 0.0 - 2.0 mmol/L   O2 Saturation 62.4 %   Patient temperature 37.0     Comment: Performed at [facility], [address] [place], [place] [address]   CT ABDOMEN PELVIS WO CONTRAST  Result Date: 11/28/2022 CLINICAL DATA:  Kidney failure, acute Sepsis hypotension, AKI. EXAM: CT ABDOMEN AND PELVIS WITHOUT CONTRAST TECHNIQUE: Multidetector CT imaging of the abdomen and pelvis was performed following the standard protocol without IV contrast. RADIATION DOSE REDUCTION: This exam was performed according to the departmental dose-optimization program which includes automated exposure control, adjustment of the mA and/or kV according to patient size and/or use of iterative reconstruction technique. COMPARISON:  None Available. FINDINGS: Examination is limited due to patient's motion during data acquisition. Lower chest: There are patchy ground-glass changes in the bilateral lower lobes, which is nonspecific but can be seen with atypical infection or aspiration. There are multiple hyperattenuating nodules in the right lung lower lobe, posterior inferiorly, nonspecific but can be seen as a sequela of previous aspiration of hyperattenuating contents. No pleural effusion. The heart is normal in size. No pericardial effusion. Hepatobiliary: The liver is normal in size.  Non-cirrhotic configuration. No suspicious mass. No intrahepatic or extrahepatic bile duct dilation. No calcified gallstones. Normal gallbladder wall thickness. No pericholecystic inflammatory changes. Pancreas: Unremarkable. No pancreatic ductal dilatation or surrounding inflammatory changes. Spleen: Within normal limits. No focal lesion. Adrenals/Urinary Tract: Adrenal glands are unremarkable. No suspicious renal mass. No hydronephrosis. There is a 4 mm nonobstructing calculus in the left kidney interpolar region. There are at least 2, punctate nonobstructing calculi in the right kidney. No other nephroureterolithiasis on either side. Unremarkable urinary bladder. Stomach/Bowel: No disproportionate dilation of the small or large bowel loops. No evidence of abnormal bowel wall thickening or inflammatory changes. The appendix is unremarkable. There are multiple diverticula mainly in the sigmoid colon, without imaging signs of diverticulitis. Vascular/Lymphatic: No ascites or pneumoperitoneum. No abdominal or pelvic lymphadenopathy, by size criteria. No aneurysmal dilation of the major abdominal arteries. There are mild peripheral atherosclerotic vascular calcifications of the aorta and its major branches. Reproductive: Normal size prostate. Symmetric seminal vesicles. Other: There are small fat containing umbilical and left inguinal hernias. The soft tissues and abdominal wall are otherwise unremarkable. Musculoskeletal: No suspicious osseous lesions. There are mild multilevel degenerative changes in the visualized spine. IMPRESSION: 1. No acute inflammatory process identified within the abdomen or pelvis. 2. Bilateral nonobstructing nephrolithiasis. 3. Patchy ground-glass  changes in the bilateral lower lobes, which is nonspecific but can be seen with atypical infection or aspiration. 4. Multiple other nonacute observations, as described above. Aortic Atherosclerosis (ICD10-I70.0). Electronically Signed   By: Jules Schick M.D.   On: 11/28/2022 16:22   DG Chest 2 View  Result Date: 11/28/2022 CLINICAL DATA:  Dementia.  Hypertension EXAM: CHEST - 2 VIEW COMPARISON:  X-ray 11/13/2022 FINDINGS: Stable cardiopericardial silhouette. No consolidation, pneumothorax or effusion. No edema. Overlapping cardiac leads. Film is under penetrated. IMPRESSION: No acute cardiopulmonary disease Electronically Signed   By: Karen Kays M.D.   On: 11/28/2022 13:52    Pending Labs Unresulted Labs (From admission, onward)     Start     Ordered   11/29/22 1347  Blood Culture (routine x 2)  (Septic presentation on arrival (screening labs, nursing and treatment orders for obvious sepsis))  BLOOD CULTURE X 2,   STAT      11/29/22 1347            Vitals/Pain Today's Vitals   11/29/22 1405 11/29/22 1416 11/29/22 1505 11/29/22 1506  BP:  123/72    Pulse:  92    Resp:      Temp: 98.7 F (37.1 C)     TempSrc: Rectal     SpO2:  98%    Weight:  87.1 kg  87.1 kg  Height:  6\' 1"  (1.854 m)  6\' 1"  (1.854 m)  PainSc:  0-No pain 0-No pain     Isolation Precautions No active isolations  Medications Medications  lactated ringers infusion ( Intravenous New Bag/Given 11/29/22 1449)  ampicillin (OMNIPEN) 2 g in sodium chloride 0.9 % 100 mL IVPB (has no administration in time range)  ceFEPIme (MAXIPIME) 2 g in sodium chloride 0.9 % 100 mL IVPB (2 g Intravenous New Bag/Given 11/29/22 1434)  metroNIDAZOLE (FLAGYL) IVPB 500 mg (500 mg Intravenous New Bag/Given 11/29/22 1452)  vancomycin (VANCOCIN) IVPB 1000 mg/200 mL premix (1,000 mg Intravenous New Bag/Given 11/29/22 1502)  lactated ringers bolus 1,000 mL (1,000 mLs Intravenous New Bag/Given 11/29/22 1449)    Mobility walks with person assist     Focused Assessments Neuro Assessment Handoff:  Swallow screen pass?  N/A         Neuro Assessment: Within Defined Limits Neuro Checks:      Has TPA been given? No If patient is a Neuro Trauma and patient is going to OR before  floor call report to 4N Charge nurse: [phone] or [phone]   R Recommendations: See Admitting Provider Note  Report given to:   Additional Notes:

## 2022-11-29 NOTE — Sepsis Progress Note (Signed)
eLink is following this Code Sepsis. °

## 2022-11-29 NOTE — ED Notes (Addendum)
Left message w/pt's nurse regarding pt's + blood cultures.  -1 aerobic gram + cocci 1/4 bottles + Entercoccus faecalis- no resistance  EDP Dr. Earlene Plater notified, recc pt come back for further eval

## 2022-11-29 NOTE — ED Triage Notes (Signed)
Patient brought in by EMS from [facility] for unresponsiveness.

## 2022-11-29 NOTE — H&P (Signed)
History and Physical  Thomas Finley ZOX:[id number] DOB: Jan 26, 1956 DOA: 11/29/2022  PCP: System, Provider Not In   Chief Complaint: Somnolence, positive blood culture  HPI: Thomas Finley is a 67 y.o. male with medical history significant for dementia, hypertension, diabetes being admitted to the hospital with positive blood culture and altered mental status.  He was brought to the emergency department from his facility due to hypotension, blood pressure responded to IV fluids, urinalysis was marginally abnormal he was started on empiric Keflex and discharged back home after blood cultures were obtained.  Today, his partner was called to bring him back to the hospital due to blood culture positive for Enterococcus faecalis.  On the way to the hospital, apparently patient became quite somnolent.  History is provided by my review of the medical record, as well as discussion with the patient's partner.  At baseline, he does have dementia and is quite inconsistent when describing his symptoms.  Today in the emergency department he has been disoriented and somewhat restless.  Partner denies any fevers, chills, but he did have some vomiting yesterday.  ED Course: On evaluation here in the emergency department he has been afebrile.  Has been borderline tachycardic initially hypotensive 86/54 but blood pressure improved to 123/72 after fluids.  Lab work shows WBC 17, hemoglobin stable at 12, normal platelets, BMP with worsening renal function creatinine 2.6 (baseline roughly 2.3).  Repeat blood and urine cultures were obtained, he was started on empiric broad-spectrum antibiotics due to lactate of 2.4.  He was seen in the emergency department by Dr. Drue Second of infectious disease, who narrowed antibiotic therapy.  Review of Systems: Please see HPI for pertinent positives and negatives. A complete 10 system review of systems are otherwise negative.  Past Medical History:  Diagnosis Date   Diabetes (HCC)     Diabetic retinopathy (HCC)    NPDR OU   Hypertension    Hypertensive retinopathy    OU   Past Surgical History:  Procedure Laterality Date   CATARACT EXTRACTION Bilateral    Dr. Elmer Picker   EYE SURGERY Bilateral    Cat Sx - Dr. Elmer Picker    Social History:  reports that he has never smoked. He has never used smokeless tobacco. He reports that he does not drink alcohol and does not use drugs.   No Known Allergies  Family History  Problem Relation Age of Onset   Diabetes Father    Diabetes Sister    Diabetes Brother      Prior to Admission medications   Medication Sig Start Date End Date Taking? Authorizing Provider  atorvastatin (LIPITOR) 10 MG tablet Take 10 mg by mouth daily.    [provider]  blood glucose meter kit and supplies KIT Dispense based on patient and insurance preference. Use up to four times daily as directed. (FOR ICD-10 E11.65). 12/30/17   Roma Kayser, MD  Blood Glucose Monitoring Suppl (ACCU-CHEK GUIDE) w/Device KIT 1 Piece by Does not apply route as directed. 10/20/17   Roma Kayser, MD  buPROPion (WELLBUTRIN XL) 300 MG 24 hr tablet Take 300 mg by mouth daily. 04/27/20   [provider]  cephALEXin (KEFLEX) 500 MG capsule Take 1 capsule (500 mg total) by mouth 4 (four) times daily. 11/28/22   Bethann Berkshire, MD  cyanocobalamin 1000 MCG tablet Take by mouth.    [provider]  donepezil (ARICEPT) 5 MG tablet Take by mouth. 04/12/20   [provider]  doxycycline (ADOXA) 100 MG  tablet Take 100 mg by mouth 2 (two) times daily. for 10 days 12/31/17   [provider]  Dulaglutide (TRULICITY) 1.5 MG/0.5ML SOPN Inject into the skin once a week. 09/30/19   [provider]  escitalopram (LEXAPRO) 20 MG tablet Take by mouth. 05/18/20   [provider]  fexofenadine (ALLEGRA) 180 MG tablet Take 180 mg by mouth daily.    [provider]  FLUoxetine (PROZAC) 20 MG tablet daily. 01/12/18    [provider]  FLUoxetine (PROZAC) 40 MG capsule 1 capsule DAILY (route: oral) 06/30/20   [provider]  glipiZIDE (GLUCOTROL) 10 MG tablet Take 5 mg by mouth 2 (two) times daily with a meal. 10/02/17   [provider]  glucose blood (ACCU-CHEK GUIDE) test strip Use as instructed 10/20/17   Roma Kayser, MD  glucose blood test strip 1 each by Other route as needed. Use as instructed 4 x daily. E11.65. One touch ultra 01/22/18   Roma Kayser, MD  hydrOXYzine (ATARAX/VISTARIL) 25 MG tablet Take 25 mg by mouth 2 (two) times daily. 02/23/20   [provider]  insulin degludec (TRESIBA FLEXTOUCH) 100 UNIT/ML SOPN FlexTouch Pen Inject 0.2 mLs (20 Units total) into the skin daily. 01/22/18   Roma Kayser, MD  insulin glargine, 2 Unit Dial, (TOUJEO MAX SOLOSTAR) 300 UNIT/ML Solostar Pen Inject into the skin. 09/01/20   [provider]  Insulin Glargine-Lixisenatide 100-33 UNT-MCG/ML SOPN Inject into the skin. 05/17/20   [provider]  insulin lispro (HUMALOG) 100 UNIT/ML KwikPen Inject into the skin. 09/03/20   [provider]  Insulin Pen Needle (BD PEN NEEDLE NANO U/F) 32G X 4 MM MISC 1 each by Does not apply route at bedtime. 01/22/18   Roma Kayser, MD  lansoprazole (PREVACID) 30 MG capsule Take by mouth. 06/21/20 06/21/21  [provider]  linagliptin (TRADJENTA) 5 MG TABS tablet Take 1 tablet (5 mg total) by mouth daily. 10/28/17   Roma Kayser, MD  lisinopril (PRINIVIL,ZESTRIL) 2.5 MG tablet Take 2.5 mg by mouth daily. 10/02/17   [provider]  metoCLOPramide (REGLAN) 10 MG tablet Take by mouth. 07/25/20   [provider]  Misc. Devices [facility] Bed Extension piece. 09/22/20   [provider]  pantoprazole (PROTONIX) 40 MG tablet Take by mouth.    [provider]  pioglitazone (ACTOS) 30 MG tablet Take by mouth. 05/17/20   [provider]     Physical Exam: BP 123/72   Pulse 92   Temp 98.7 F (37.1 C) (Rectal)   Resp 19   Ht 6\' 1"  (1.854 m)   Wt 87.1 kg   SpO2 98%   BMI 25.33 kg/m   General: Somnolent, somewhat restless, partner at the bedside.  Does not appear to be in any acute distress, but unable to determine orientation. Eyes: EOMI, clear conjuctivae, white sclerea Neck: supple, no masses, trachea mildline  Cardiovascular: RRR, no murmurs or rubs, no peripheral edema  Respiratory: clear to auscultation bilaterally, no wheezes, no crackles  Abdomen: soft, nontender, nondistended, normal bowel tones heard  Skin: dry, no rashes, he does have a noninfected appearing callus at the base of the right first metatarsal Musculoskeletal: no joint effusions, normal range of motion          Labs on Admission:  Basic Metabolic Panel: Recent Labs  Lab 11/28/22 1217 11/29/22 1354 11/29/22 1407  NA 133* 135 137  K 4.5 4.6 4.5  CL 97* 98  101  CO2 25 25  --   GLUCOSE 142* 127* 128*  BUN 48* 37* 33*  CREATININE 3.03* 2.48* 2.60*  CALCIUM 9.5 9.1  --    Liver Function Tests: Recent Labs  Lab 11/28/22 1217 11/29/22 1354  AST 28 27  ALT 30 25  ALKPHOS 76 67  BILITOT 0.5 0.4  PROT 8.3* 7.7  ALBUMIN 3.8 3.5   Recent Labs  Lab 11/29/22 1354  LIPASE 39   No results for input(s): "AMMONIA" in the last 168 hours. CBC: Recent Labs  Lab 11/28/22 1217 11/29/22 1354 11/29/22 1407  WBC 16.0* 17.0*  --   NEUTROABS 13.2* 12.9*  --   HGB 12.1* 11.3* 12.2*  HCT 37.8* 34.9* 36.0*  MCV 86.3 87.5  --   PLT 342 312  --    Cardiac Enzymes: No results for input(s): "CKTOTAL", "CKMB", "CKMBINDEX", "TROPONINI" in the last 168 hours.  BNP (last 3 results) No results for input(s): "BNP" in the last 8760 hours.  ProBNP (last 3 results) No results for input(s): "PROBNP" in the last 8760 hours.  CBG: Recent Labs  Lab 11/29/22 1341  GLUCAP 118*    Radiological Exams on Admission: CT Head Wo Contrast  Result  Date: 11/29/2022 CLINICAL DATA:  Mental status change of unknown cause. Found unresponsive by EMS. EXAM: CT HEAD WITHOUT CONTRAST TECHNIQUE: Contiguous axial images were obtained from the base of the skull through the vertex without intravenous contrast. RADIATION DOSE REDUCTION: This exam was performed according to the departmental dose-optimization program which includes automated exposure control, adjustment of the mA and/or kV according to patient size and/or use of iterative reconstruction technique. COMPARISON:  11/14/2022 FINDINGS: Brain: Ventricles, cisterns and other CSF spaces are within normal. There is chronic ischemic microvascular disease. There is no mass, mass effect, shift of midline structures or acute hemorrhage. No evidence of acute infarction. Vascular: No hyperdense vessel or unexpected calcification. Skull: Normal. Negative for fracture or focal lesion. Sinuses/Orbits: No acute finding. Other: None. IMPRESSION: 1. No acute findings. 2. Chronic ischemic microvascular disease. Electronically Signed   By: Elberta Fortis M.D.   On: 11/29/2022 17:07   DG Chest Port 1 View  Result Date: 11/29/2022 CLINICAL DATA:  Sepsis. EXAM: PORTABLE CHEST 1 VIEW COMPARISON:  X-ray 11/28/2022 FINDINGS: No consolidation, pneumothorax or effusion. No edema. Normal cardiopericardial silhouette. Hand obscures the left costophrenic angle. Film is under penetrated. IMPRESSION: No acute cardiopulmonary disease. Electronically Signed   By: Karen Kays M.D.   On: 11/29/2022 16:58   CT ABDOMEN PELVIS WO CONTRAST  Result Date: 11/28/2022 CLINICAL DATA:  Kidney failure, acute Sepsis hypotension, AKI. EXAM: CT ABDOMEN AND PELVIS WITHOUT CONTRAST TECHNIQUE: Multidetector CT imaging of the abdomen and pelvis was performed following the standard protocol without IV contrast. RADIATION DOSE REDUCTION: This exam was performed according to the departmental dose-optimization program which includes automated exposure control,  adjustment of the mA and/or kV according to patient size and/or use of iterative reconstruction technique. COMPARISON:  None Available. FINDINGS: Examination is limited due to patient's motion during data acquisition. Lower chest: There are patchy ground-glass changes in the bilateral lower lobes, which is nonspecific but can be seen with atypical infection or aspiration. There are multiple hyperattenuating nodules in the right lung lower lobe, posterior inferiorly, nonspecific but can be seen as a sequela of previous aspiration of hyperattenuating contents. No pleural effusion. The heart is normal in size. No pericardial effusion. Hepatobiliary: The liver is normal in size. Non-cirrhotic configuration. No suspicious mass.  No intrahepatic or extrahepatic bile duct dilation. No calcified gallstones. Normal gallbladder wall thickness. No pericholecystic inflammatory changes. Pancreas: Unremarkable. No pancreatic ductal dilatation or surrounding inflammatory changes. Spleen: Within normal limits. No focal lesion. Adrenals/Urinary Tract: Adrenal glands are unremarkable. No suspicious renal mass. No hydronephrosis. There is a 4 mm nonobstructing calculus in the left kidney interpolar region. There are at least 2, punctate nonobstructing calculi in the right kidney. No other nephroureterolithiasis on either side. Unremarkable urinary bladder. Stomach/Bowel: No disproportionate dilation of the small or large bowel loops. No evidence of abnormal bowel wall thickening or inflammatory changes. The appendix is unremarkable. There are multiple diverticula mainly in the sigmoid colon, without imaging signs of diverticulitis. Vascular/Lymphatic: No ascites or pneumoperitoneum. No abdominal or pelvic lymphadenopathy, by size criteria. No aneurysmal dilation of the major abdominal arteries. There are mild peripheral atherosclerotic vascular calcifications of the aorta and its major branches. Reproductive: Normal size prostate.  Symmetric seminal vesicles. Other: There are small fat containing umbilical and left inguinal hernias. The soft tissues and abdominal wall are otherwise unremarkable. Musculoskeletal: No suspicious osseous lesions. There are mild multilevel degenerative changes in the visualized spine. IMPRESSION: 1. No acute inflammatory process identified within the abdomen or pelvis. 2. Bilateral nonobstructing nephrolithiasis. 3. Patchy ground-glass changes in the bilateral lower lobes, which is nonspecific but can be seen with atypical infection or aspiration. 4. Multiple other nonacute observations, as described above. Aortic Atherosclerosis (ICD10-I70.0). Electronically Signed   By: Jules Schick M.D.   On: 11/28/2022 16:22   DG Chest 2 View  Result Date: 11/28/2022 CLINICAL DATA:  Dementia.  Hypertension EXAM: CHEST - 2 VIEW COMPARISON:  X-ray 11/13/2022 FINDINGS: Stable cardiopericardial silhouette. No consolidation, pneumothorax or effusion. No edema. Overlapping cardiac leads. Film is under penetrated. IMPRESSION: No acute cardiopulmonary disease Electronically Signed   By: Karen Kays M.D.   On: 11/28/2022 13:52    Assessment/Plan Thomas Finley is a 67 y.o. male with medical history significant for dementia, hypertension, diabetes being admitted to the hospital with positive blood culture and altered mental status.   Sepsis-meeting criteria with leukocytosis, tachycardia, lactate of 2.4.  Source is likely has bacteremia without unclear etiology. -Inpatient admission to MedSurg -Continue IV fluids, trend lactate -Appreciate ID recommendation, will continue empiric ampicillin -Trend lactic acid  Bacteremia-unclear etiology, he does have right first toe callus -Follow repeat blood cultures -Check TTE -Check right foot x-ray, plan MRI if any evidence of osteo  Acute on CKD-CKD likely due to diabetic nephropathy, acute kidney injury potentially due to ATN from hypotension secondary to sepsis -Hold  antihypertensives -Hold nephrotoxins -Hydrate with normal saline -Check renal function with daily labs  Type 2 diabetes -Check hemoglobin A1c -Hold long-acting insulin while n.p.o./altered -Moderate dose sliding scale  Hyperlipidemia-Lipitor  GERD-Protonix  Hypertension-hold home medications due to initial hypotension related to sepsis  DVT prophylaxis: Heparin subcu    Code Status: Do not attempt resuscitation (DNR) PRE-ARREST INTERVENTIONS DESIRED goldenrod form reviewed, also confirmed with patient's partner at the bedside  Consults called: Infectious disease  Admission status: The appropriate patient status for this patient is INPATIENT. Inpatient status is judged to be reasonable and necessary in order to provide the required intensity of service to ensure the patient's safety. The patient's presenting symptoms, physical exam findings, and initial radiographic and laboratory data in the context of their chronic comorbidities is felt to place them at high risk for further clinical deterioration. Furthermore, it is not anticipated that the patient will be medically stable for  discharge from the hospital within 2 midnights of admission.    I certify that at the point of admission it is my clinical judgment that the patient will require inpatient hospital care spanning beyond 2 midnights from the point of admission due to high intensity of service, high risk for further deterioration and high frequency of surveillance required   Time spent: 53 minutes  Millie Forde Sharlette Dense MD [facility] Pager [phone]  If 7PM-7AM, please contact night-coverage [email or web address] Password [facility]  11/29/2022, 5:52 PM

## 2022-11-30 ENCOUNTER — Inpatient Hospital Stay (HOSPITAL_COMMUNITY): Payer: Medicare Other

## 2022-11-30 DIAGNOSIS — N1831 Chronic kidney disease, stage 3a: Secondary | ICD-10-CM | POA: Diagnosis not present

## 2022-11-30 DIAGNOSIS — E1122 Type 2 diabetes mellitus with diabetic chronic kidney disease: Secondary | ICD-10-CM

## 2022-11-30 DIAGNOSIS — R7881 Bacteremia: Secondary | ICD-10-CM

## 2022-11-30 LAB — BASIC METABOLIC PANEL
Anion gap: 11 (ref 5–15)
BUN: 27 mg/dL — ABNORMAL HIGH (ref 8–23)
CO2: 24 mmol/L (ref 22–32)
Calcium: 8.6 mg/dL — ABNORMAL LOW (ref 8.9–10.3)
Chloride: 102 mmol/L (ref 98–111)
Creatinine, Ser: 1.62 mg/dL — ABNORMAL HIGH (ref 0.61–1.24)
GFR, Estimated: 46 mL/min — ABNORMAL LOW (ref >60)
Glucose, Bld: 100 mg/dL — ABNORMAL HIGH (ref 70–99)
Potassium: 3.9 mmol/L (ref 3.5–5.1)
Sodium: 137 mmol/L (ref 135–145)

## 2022-11-30 LAB — CBC
HCT: 32 % — ABNORMAL LOW (ref 39.0–52.0)
Hemoglobin: 10.2 g/dL — ABNORMAL LOW (ref 13.0–17.0)
MCH: 28.3 pg (ref 26.0–34.0)
MCHC: 31.9 g/dL (ref 30.0–36.0)
MCV: 88.6 fL (ref 80.0–100.0)
Platelets: 250 10*3/uL (ref 150–400)
RBC: 3.61 MIL/uL — ABNORMAL LOW (ref 4.22–5.81)
RDW: 15.1 % (ref 11.5–15.5)
WBC: 13.4 10*3/uL — ABNORMAL HIGH (ref 4.0–10.5)
nRBC: 0 % (ref 0.0–0.2)

## 2022-11-30 LAB — GLUCOSE, CAPILLARY
Glucose-Capillary: 95 mg/dL (ref 70–99)
Glucose-Capillary: 117 mg/dL — ABNORMAL HIGH (ref 70–99)
Glucose-Capillary: 114 mg/dL — ABNORMAL HIGH (ref 70–99)
Glucose-Capillary: 131 mg/dL — ABNORMAL HIGH (ref 70–99)

## 2022-11-30 LAB — ECHOCARDIOGRAM COMPLETE
AR max vel: 3.11 cm2
AV Area VTI: 3.06 cm2
AV Area mean vel: 3.09 cm2
AV Mean grad: 4 mmHg
AV Peak grad: 7.6 mmHg
Ao pk vel: 1.38 m/s
Area-P 1/2: 3.39 cm2
S' Lateral: 2.6 cm

## 2022-11-30 MED ORDER — SODIUM CHLORIDE 0.9 % IV SOLN
2.0000 g | INTRAVENOUS | Status: DC
  Administered 2022-11-30 – 2022-12-02 (×13): 2 g via INTRAVENOUS
  Filled 2022-11-30 (×14): qty 2000

## 2022-11-30 NOTE — Progress Notes (Signed)
PHARMACY NOTE:  ANTIMICROBIAL RENAL DOSAGE ADJUSTMENT  Current antimicrobial regimen includes a mismatch between antimicrobial dosage and estimated renal function.  As per policy approved by the Pharmacy & Therapeutics and Medical Executive Committees, the antimicrobial dosage will be adjusted accordingly.  Current antimicrobial dosage: ampicillin 2 g IV q6h  Indication: Enterococcal bacteremia  Renal Function:  Estimated Creatinine Clearance: 50 mL/min (A) (by C-G formula based on SCr of 1.62 mg/dL (H)). []      On intermittent HD, scheduled: []      On CRRT    Antimicrobial dosage has been changed to:  Ampicillin 2 g IV q4h for improved renal function  Cindi Carbon, [facility] 11/30/2022 12:03 PM

## 2022-11-30 NOTE — Progress Notes (Signed)
[facility]                                                                               Thomas Finley, is a 67 y.o. male, DOB - May 29, 1955, NWG:[id number] Admit date - 11/29/2022    Outpatient Primary MD for the patient is System, Provider Not In  LOS - 1  days    Brief summary    67 y.o. male with medical history significant for dementia, hypertension, diabetes being admitted to the hospital with positive blood culture and altered mental status.  He was brought to the emergency department from his facility due to hypotension, blood pressure responded to IV fluids, urinalysis was marginally abnormal he was started on empiric Keflex and discharged back home after blood cultures were obtained.  Today, his partner was called to bring him back to the hospital due to blood culture positive for Enterococcus faecalis.   He was seen in the emergency department by Dr. Drue Second of infectious disease, who narrowed antibiotic therapy.    Assessment & Plan    Assessment and Plan:  Sepsis secondary to E coli bacteremia Suspect urinary source. On IV ampicillin, will follow up the sensitivities.  Continue with IV fluids.  Follow up echocardiogram.  X rays of the foot show Mild periarticular soft tissue swelling surrounding the first metatarsophalangeal joint. CXR is negative for infection.  CT abd and pelvis show Patchy ground-glass changes in the bilateral lower lobes, which is nonspecific but can be seen with atypical infection or aspiration. Improving leukocytosis.    Acute metabolic encephalopathy Appears to have improved. He is alert and following commands.   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    Hyponatremia Resolved.   Type 2 DM:  CBG (last 3)  Recent Labs    11/29/22 2130 11/30/22 0755 11/30/22 1204  GLUCAP 73 95 117*   Hemoglobin A1c is 6.9% Continue with SSI.    Dementia: Continue with aricept.     Hyperlipidemia:  Resume lipitor.      Estimated body mass index is 25.33 kg/m as calculated from the following:   Height as of this encounter: 6\' 1"  (1.854 m).   Weight as of this encounter: 87.1 kg.  Code Status: DNR DVT Prophylaxis:  heparin injection 5,000 Units Start: 11/29/22 2200 SCDs Start: 11/29/22 1751   Level of Care: Level of care: Med-Surg Family Communication: none at bedside.   Procedures:  None.   Consultants:   None.   Antimicrobials:   Anti-infectives (From admission, onward)    Start     Dose/Rate Route Frequency Ordered Stop   11/30/22 1600  ampicillin (OMNIPEN) 2 g in sodium chloride 0.9 % 100 mL IVPB        2 g 300 mL/hr over 20 Minutes Intravenous Every 4 hours 11/30/22 1203     11/30/22 0000  ampicillin (OMNIPEN) 2 g in sodium chloride 0.9 % 100 mL IVPB  Status:  Discontinued        2 g 300 mL/hr over 20 Minutes Intravenous Every 6 hours 11/29/22 1516 11/30/22 1203   11/29/22 1400  ceFEPIme (MAXIPIME) 2 g in sodium chloride 0.9 % 100 mL  IVPB        2 g 200 mL/hr over 30 Minutes Intravenous  Once 11/29/22 1347 11/29/22 1658   11/29/22 1400  metroNIDAZOLE (FLAGYL) IVPB 500 mg        500 mg 100 mL/hr over 60 Minutes Intravenous  Once 11/29/22 1347 11/29/22 1658   11/29/22 1400  vancomycin (VANCOCIN) IVPB 1000 mg/200 mL premix        1,000 mg 200 mL/hr over 60 Minutes Intravenous  Once 11/29/22 1347 11/29/22 1659        Medications  Scheduled Meds:  atorvastatin  10 mg Oral Daily   donepezil  5 mg Oral QHS   escitalopram  20 mg Oral Daily   feeding supplement  237 mL Oral BID BM   heparin injection (subcutaneous)  5,000 Units Subcutaneous Q8H   influenza vaccine adjuvanted  0.5 mL Intramuscular Tomorrow-1000   insulin aspart  0-15 Units Subcutaneous TID WC   insulin aspart  0-5 Units Subcutaneous QHS   pantoprazole  40 mg Oral Daily   Continuous Infusions:  sodium chloride 75 mL/hr at 11/30/22 1434   ampicillin (OMNIPEN) IV     PRN Meds:.acetaminophen **OR** acetaminophen,  albuterol, ondansetron **OR** ondansetron (ZOFRAN) IV, traZODone    Subjective:   Thomas Finley was seen and examined today.  No new complaints.   Objective:   Vitals:   11/30/22 0150 11/30/22 0447 11/30/22 0911 11/30/22 1432  BP: 135/65 126/82 103/87 (!) 176/91  Pulse: 93 90 90 80  Resp: 18 19 20 18   Temp: 99.1 F (37.3 C) 98.7 F (37.1 C) 98.7 F (37.1 C) 98.7 F (37.1 C)  TempSrc: Oral Oral    SpO2: 97% 99% 100% 99%  Weight:      Height:        Intake/Output Summary (Last 24 hours) at 11/30/2022 1532 Last data filed at 11/30/2022 0900 Gross per 24 hour  Intake 2623.81 ml  Output --  Net 2623.81 ml   Filed Weights   11/29/22 1416 11/29/22 1506  Weight: 87.1 kg 87.1 kg     Exam General exam: Appears calm and comfortable  Respiratory system: Clear to auscultation. Respiratory effort normal. Cardiovascular system: S1 & S2 heard, RRR.  Gastrointestinal system: Abdomen is nondistended, soft and nontender. Central nervous system: Alert and oriented. No focal neurological deficits. Extremities: Symmetric 5 x 5 power. Skin: No rashes, Psychiatry: Mood & affect appropriate.     Data Reviewed:  I have personally reviewed following labs and imaging studies   CBC Lab Results  Component Value Date   WBC 13.4 (H) 11/30/2022   RBC 3.61 (L) 11/30/2022   HGB 10.2 (L) 11/30/2022   HCT 32.0 (L) 11/30/2022   MCV 88.6 11/30/2022   MCH 28.3 11/30/2022   PLT 250 11/30/2022   MCHC 31.9 11/30/2022   RDW 15.1 11/30/2022   LYMPHSABS 2.3 11/29/2022   MONOABS 1.2 (H) 11/29/2022   EOSABS 0.4 11/29/2022   BASOSABS 0.1 11/29/2022     Last metabolic panel Lab Results  Component Value Date   NA 137 11/30/2022   K 3.9 11/30/2022   CL 102 11/30/2022   CO2 24 11/30/2022   BUN 27 (H) 11/30/2022   CREATININE 1.62 (H) 11/30/2022   GLUCOSE 100 (H) 11/30/2022   GFRNONAA 46 (L) 11/30/2022   GFRAA 83 01/12/2018   CALCIUM 8.6 (L) 11/30/2022   PROT 7.7 11/29/2022   ALBUMIN 3.5  11/29/2022   LABGLOB 3.6 01/12/2018   AGRATIO 1.3 01/12/2018   BILITOT 0.4  11/29/2022   ALKPHOS 67 11/29/2022   AST 27 11/29/2022   ALT 25 11/29/2022   ANIONGAP 11 11/30/2022    CBG (last 3)  Recent Labs    11/29/22 2130 11/30/22 0755 11/30/22 1204  GLUCAP 73 95 117*      Coagulation Profile: Recent Labs  Lab 11/29/22 1354  INR 1.1     Radiology Studies: DG Foot 2 Views Right  Result Date: 11/29/2022 CLINICAL DATA:  Right foot wound, cellulitis, altered mental status EXAM: RIGHT FOOT - 2 VIEW COMPARISON:  None Available. FINDINGS: Normal alignment. No acute fracture or dislocation. Joint spaces are preserved. No osseous erosions or abnormal periosteal reaction. Mild periarticular soft tissue swelling surrounding the first metatarsophalangeal joint. Vascular calcifications are noted. Tiny plantar and superior calcaneal spurs are seen. IMPRESSION: 1. Mild periarticular soft tissue swelling surrounding the first metatarsophalangeal joint. No acute fracture or dislocation. Electronically Signed   By: Helyn Numbers M.D.   On: 11/29/2022 19:19   CT Head Wo Contrast  Result Date: 11/29/2022 CLINICAL DATA:  Mental status change of unknown cause. Found unresponsive by EMS. EXAM: CT HEAD WITHOUT CONTRAST TECHNIQUE: Contiguous axial images were obtained from the base of the skull through the vertex without intravenous contrast. RADIATION DOSE REDUCTION: This exam was performed according to the departmental dose-optimization program which includes automated exposure control, adjustment of the mA and/or kV according to patient size and/or use of iterative reconstruction technique. COMPARISON:  11/14/2022 FINDINGS: Brain: Ventricles, cisterns and other CSF spaces are within normal. There is chronic ischemic microvascular disease. There is no mass, mass effect, shift of midline structures or acute hemorrhage. No evidence of acute infarction. Vascular: No hyperdense vessel or unexpected  calcification. Skull: Normal. Negative for fracture or focal lesion. Sinuses/Orbits: No acute finding. Other: None. IMPRESSION: 1. No acute findings. 2. Chronic ischemic microvascular disease. Electronically Signed   By: Elberta Fortis M.D.   On: 11/29/2022 17:07   DG Chest Port 1 View  Result Date: 11/29/2022 CLINICAL DATA:  Sepsis. EXAM: PORTABLE CHEST 1 VIEW COMPARISON:  X-ray 11/28/2022 FINDINGS: No consolidation, pneumothorax or effusion. No edema. Normal cardiopericardial silhouette. Hand obscures the left costophrenic angle. Film is under penetrated. IMPRESSION: No acute cardiopulmonary disease. Electronically Signed   By: Karen Kays M.D.   On: 11/29/2022 16:58       Kathlen Mody M.D. [facility] 11/30/2022, 3:32 PM  Available via Epic secure chat 7am-7pm After 7 pm, please refer to night coverage provider listed on amion.

## 2022-11-30 NOTE — Progress Notes (Signed)
Mobility Specialist - Progress Note   11/30/22 1047  Mobility  Activity Ambulated with assistance in hallway  Level of Assistance Contact guard assist, steadying assist  Assistive Device Front wheel walker  Distance Ambulated (ft) 215 ft  Activity Response Tolerated well  Mobility Referral Yes  $Mobility charge 1 Mobility  Mobility Specialist Start Time (ACUTE ONLY) 1025  Mobility Specialist Stop Time (ACUTE ONLY) 1041  Mobility Specialist Time Calculation (min) (ACUTE ONLY) 16 min   Pt received in bed and agreeable to mobility. Once in hallway pt required a seated rest break due to fatigue. No complaints during session. Pt required several verbal cues throughout session. Pt to bed after session with all needs met. Bed alarm on.   [facility]

## 2022-11-30 NOTE — Plan of Care (Signed)
?  Problem: Clinical Measurements: ?Goal: Ability to maintain clinical measurements within normal limits will improve ?Outcome: Progressing ?Goal: Will remain free from infection ?Outcome: Progressing ?Goal: Diagnostic test results will improve ?Outcome: Progressing ?  ?

## 2022-11-30 NOTE — Progress Notes (Signed)
[facility] Liaison Note   This is a pending hospice [facility] patient. Please call prior to discharge.    Please call with any questions or concerns. Thank you   Eugenie Birks, MSW [phone]

## 2022-11-30 NOTE — Progress Notes (Signed)
PHARMACY - PHYSICIAN COMMUNICATION CRITICAL VALUE ALERT - BLOOD CULTURE IDENTIFICATION (BCID)  Thomas Finley is an 67 y.o. male who presented to [facility] on 11/29/2022 with a chief complaint of AMS.    Assessment:  Previous blood cultures positive for E.faecalis and are being treated with ampicillin per ID service.  New blood culture results from culture re-draw on 9/6 now show GP rods, no BCID run.    Name of physician (or Provider) Contacted: A. Virgel Manifold  Current antibiotics: Narrowed to Ampicillin alone on 9/7  Changes to prescribed antibiotics recommended:  Defer any antibiotic changes at this time.  ID service will see patient on day shift.     Results for orders placed or performed during the hospital encounter of 11/28/22  Blood Culture ID Panel (Reflexed) (Collected: 11/28/2022  1:58 PM)  Result Value Ref Range   Enterococcus faecalis DETECTED (A) NOT DETECTED   Enterococcus Faecium NOT DETECTED NOT DETECTED   Listeria monocytogenes NOT DETECTED NOT DETECTED   Staphylococcus species NOT DETECTED NOT DETECTED   Staphylococcus aureus (BCID) NOT DETECTED NOT DETECTED   Staphylococcus epidermidis NOT DETECTED NOT DETECTED   Staphylococcus lugdunensis NOT DETECTED NOT DETECTED   Streptococcus species NOT DETECTED NOT DETECTED   Streptococcus agalactiae NOT DETECTED NOT DETECTED   Streptococcus pneumoniae NOT DETECTED NOT DETECTED   Streptococcus pyogenes NOT DETECTED NOT DETECTED   A.calcoaceticus-baumannii NOT DETECTED NOT DETECTED   Bacteroides fragilis NOT DETECTED NOT DETECTED   Enterobacterales NOT DETECTED NOT DETECTED   Enterobacter cloacae complex NOT DETECTED NOT DETECTED   Escherichia coli NOT DETECTED NOT DETECTED   Klebsiella aerogenes NOT DETECTED NOT DETECTED   Klebsiella oxytoca NOT DETECTED NOT DETECTED   Klebsiella pneumoniae NOT DETECTED NOT DETECTED   Proteus species NOT DETECTED NOT DETECTED   Salmonella species NOT DETECTED NOT DETECTED   Serratia marcescens  NOT DETECTED NOT DETECTED   Haemophilus influenzae NOT DETECTED NOT DETECTED   Neisseria meningitidis NOT DETECTED NOT DETECTED   Pseudomonas aeruginosa NOT DETECTED NOT DETECTED   Stenotrophomonas maltophilia NOT DETECTED NOT DETECTED   Candida albicans NOT DETECTED NOT DETECTED   Candida auris NOT DETECTED NOT DETECTED   Candida glabrata NOT DETECTED NOT DETECTED   Candida krusei NOT DETECTED NOT DETECTED   Candida parapsilosis NOT DETECTED NOT DETECTED   Candida tropicalis NOT DETECTED NOT DETECTED   Cryptococcus neoformans/gattii NOT DETECTED NOT DETECTED   Vancomycin resistance NOT DETECTED NOT DETECTED    Lynann Beaver PharmD, BCPS [facility] main pharmacy [phone] 11/30/2022 4:55 AM

## 2022-12-01 DIAGNOSIS — E1122 Type 2 diabetes mellitus with diabetic chronic kidney disease: Secondary | ICD-10-CM | POA: Diagnosis not present

## 2022-12-01 DIAGNOSIS — R7881 Bacteremia: Secondary | ICD-10-CM | POA: Diagnosis not present

## 2022-12-01 DIAGNOSIS — N1831 Chronic kidney disease, stage 3a: Secondary | ICD-10-CM | POA: Diagnosis not present

## 2022-12-01 LAB — CULTURE, BLOOD (ROUTINE X 2)
Special Requests: ADEQUATE
Special Requests: ADEQUATE

## 2022-12-01 LAB — CBC WITH DIFFERENTIAL/PLATELET
Abs Immature Granulocytes: 0.06 10*3/uL (ref 0.00–0.07)
Basophils Absolute: 0.1 10*3/uL (ref 0.0–0.1)
Basophils Relative: 1 %
Eosinophils Absolute: 0.2 10*3/uL (ref 0.0–0.5)
Eosinophils Relative: 2 %
HCT: 31.8 % — ABNORMAL LOW (ref 39.0–52.0)
Hemoglobin: 10.3 g/dL — ABNORMAL LOW (ref 13.0–17.0)
Immature Granulocytes: 1 %
Lymphocytes Relative: 24 %
Lymphs Abs: 2.3 10*3/uL (ref 0.7–4.0)
MCH: 28.1 pg (ref 26.0–34.0)
MCHC: 32.4 g/dL (ref 30.0–36.0)
MCV: 86.6 fL (ref 80.0–100.0)
Monocytes Absolute: 0.7 10*3/uL (ref 0.1–1.0)
Monocytes Relative: 8 %
Neutro Abs: 6.3 10*3/uL (ref 1.7–7.7)
Neutrophils Relative %: 64 %
Platelets: 246 10*3/uL (ref 150–400)
RBC: 3.67 MIL/uL — ABNORMAL LOW (ref 4.22–5.81)
RDW: 15.2 % (ref 11.5–15.5)
WBC: 9.6 10*3/uL (ref 4.0–10.5)
nRBC: 0 % (ref 0.0–0.2)

## 2022-12-01 LAB — BASIC METABOLIC PANEL
Anion gap: 11 (ref 5–15)
BUN: 23 mg/dL (ref 8–23)
CO2: 25 mmol/L (ref 22–32)
Calcium: 7.9 mg/dL — ABNORMAL LOW (ref 8.9–10.3)
Chloride: 102 mmol/L (ref 98–111)
Creatinine, Ser: 1.69 mg/dL — ABNORMAL HIGH (ref 0.61–1.24)
GFR, Estimated: 44 mL/min — ABNORMAL LOW (ref >60)
Glucose, Bld: 114 mg/dL — ABNORMAL HIGH (ref 70–99)
Potassium: 3.5 mmol/L (ref 3.5–5.1)
Sodium: 138 mmol/L (ref 135–145)

## 2022-12-01 LAB — MAGNESIUM: Magnesium: 1.4 mg/dL — ABNORMAL LOW (ref 1.7–2.4)

## 2022-12-01 LAB — URINE CULTURE: Culture: 80000 — AB

## 2022-12-01 LAB — GLUCOSE, CAPILLARY
Glucose-Capillary: 96 mg/dL (ref 70–99)
Glucose-Capillary: 130 mg/dL — ABNORMAL HIGH (ref 70–99)
Glucose-Capillary: 140 mg/dL — ABNORMAL HIGH (ref 70–99)

## 2022-12-01 MED ORDER — MAGNESIUM SULFATE 2 GM/50ML IV SOLN
2.0000 g | Freq: Once | INTRAVENOUS | Status: AC
  Administered 2022-12-01: 2 g via INTRAVENOUS
  Filled 2022-12-01: qty 50

## 2022-12-01 MED ORDER — POTASSIUM CHLORIDE CRYS ER 20 MEQ PO TBCR
40.0000 meq | EXTENDED_RELEASE_TABLET | Freq: Once | ORAL | Status: AC
  Administered 2022-12-01: 40 meq via ORAL
  Filled 2022-12-01: qty 2

## 2022-12-01 MED ORDER — HALOPERIDOL LACTATE 2 MG/ML PO CONC
1.0000 mg | Freq: Three times a day (TID) | ORAL | Status: DC | PRN
  Administered 2022-12-01 – 2022-12-02 (×2): 1 mg via ORAL
  Filled 2022-12-01 (×4): qty 5

## 2022-12-01 MED ORDER — HYDROXYZINE HCL 10 MG PO TABS
10.0000 mg | ORAL_TABLET | Freq: Three times a day (TID) | ORAL | Status: DC | PRN
  Administered 2022-12-01: 10 mg via ORAL
  Filled 2022-12-01 (×2): qty 1

## 2022-12-01 NOTE — Progress Notes (Signed)
Another consult was placed to the hospital's IV Nurse for new access; pt continues to pull out his IVs ( around 5-6 now);  pt with very limited access of healthy veins;  ultrasound used, and scar tissue is noted;  tech and family members at bedside, as pt is restless, and moves around in the bed;  iv site is again wrapped loosely with gauze kling; suggest placing mittens on each hand to try to prevent this IV from being removed.

## 2022-12-01 NOTE — Progress Notes (Signed)
[facility] Liaison Note   This is a pending hospice [facility] patient. Please call prior to discharge.    Please call with any questions or concerns. Thank you   Eugenie Birks, MSW [phone]

## 2022-12-01 NOTE — Plan of Care (Signed)
?  Problem: Education: ?Goal: Knowledge of General Education information will improve ?Description: Including pain rating scale, medication(s)/side effects and non-pharmacologic comfort measures ?Outcome: Progressing ?  ?Problem: Clinical Measurements: ?Goal: Will remain free from infection ?Outcome: Progressing ?Goal: Diagnostic test results will improve ?Outcome: Progressing ?Goal: Respiratory complications will improve ?Outcome: Progressing ?  ?Problem: Coping: ?Goal: Level of anxiety will decrease ?Outcome: Progressing ?  ?

## 2022-12-01 NOTE — Progress Notes (Signed)
Mobility Specialist - Progress Note   12/01/22 1330  Mobility  Activity Ambulated with assistance in hallway  Level of Assistance Contact guard assist, steadying assist  Assistive Device Front wheel walker  Distance Ambulated (ft) 250 ft  Activity Response Tolerated well  Mobility Referral Yes  $Mobility charge 1 Mobility  Mobility Specialist Start Time (ACUTE ONLY) 0106  Mobility Specialist Stop Time (ACUTE ONLY) 0130  Mobility Specialist Time Calculation (min) (ACUTE ONLY) 24 min   Pt received EOB and agreeable to mobility. Pt required verbal cues for walked direction. No complaints during session. Pt to recliner after session with all needs met.    [facility]

## 2022-12-01 NOTE — Progress Notes (Signed)
[facility]                                                                               Thomas Finley, is a 67 y.o. male, DOB - 06-Feb-1956, KGM:[id number] Admit date - 11/29/2022    Outpatient Primary MD for the patient is System, Provider Not In  LOS - 2  days    Brief summary    67 y.o. male with medical history significant for dementia, hypertension, diabetes being admitted to the hospital with positive blood culture and altered mental status.  He was brought to the emergency department from his facility due to hypotension, blood pressure responded to IV fluids, urinalysis was marginally abnormal he was started on empiric Keflex and discharged back home after blood cultures were obtained.  Today, his partner was called to bring him back to the hospital due to blood culture positive for Enterococcus faecalis.   He was seen in the emergency department by Dr. Drue Second of infectious disease, who narrowed antibiotic therapy.    Assessment & Plan    Assessment and Plan:  Sepsis secondary to Enterococcus fecalis Suspect urinary source. On IV ampicillin, sensitive to ampicillin.  Continue with IV fluids.  Follow up echocardiogram.   X rays of the foot show Mild periarticular soft tissue swelling surrounding the first metatarsophalangeal joint. CXR is negative for infection.  CT abd and pelvis show Patchy ground-glass changes in the bilateral lower lobes, which is nonspecific but can be seen with atypical infection or aspiration. Improving leukocytosis.    Acute metabolic encephalopathy Appears to have improved. He is alert and following commands.   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    Hyponatremia Resolved.   Type 2 DM:  CBG (last 3)  Recent Labs    11/30/22 2141 12/01/22 0751 12/01/22 1152  GLUCAP 131* 96 130*   Hemoglobin A1c is 6.9% Continue with SSI.    Dementia: Continue with aricept.     Hyperlipidemia:  Resume lipitor.    Agitated, hospital delirium:  - started him prn haldol.      Estimated body mass index is 25.33 kg/m as calculated from the following:   Height as of this encounter: 6\' 1"  (1.854 m).   Weight as of this encounter: 87.1 kg.  Code Status: DNR DVT Prophylaxis:  heparin injection 5,000 Units Start: 11/29/22 2200 SCDs Start: 11/29/22 1751   Level of Care: Level of care: Med-Surg Family Communication: none at bedside.   Procedures:  None.   Consultants:   None.   Antimicrobials:   Anti-infectives (From admission, onward)    Start     Dose/Rate Route Frequency Ordered Stop   11/30/22 1600  ampicillin (OMNIPEN) 2 g in sodium chloride 0.9 % 100 mL IVPB        2 g 300 mL/hr over 20 Minutes Intravenous Every 4 hours 11/30/22 1203     11/30/22 0000  ampicillin (OMNIPEN) 2 g in sodium chloride 0.9 % 100 mL IVPB  Status:  Discontinued        2 g 300 mL/hr over 20 Minutes Intravenous Every 6 hours 11/29/22 1516 11/30/22 1203   11/29/22 1400  ceFEPIme (  MAXIPIME) 2 g in sodium chloride 0.9 % 100 mL IVPB        2 g 200 mL/hr over 30 Minutes Intravenous  Once 11/29/22 1347 11/29/22 1658   11/29/22 1400  metroNIDAZOLE (FLAGYL) IVPB 500 mg        500 mg 100 mL/hr over 60 Minutes Intravenous  Once 11/29/22 1347 11/29/22 1658   11/29/22 1400  vancomycin (VANCOCIN) IVPB 1000 mg/200 mL premix        1,000 mg 200 mL/hr over 60 Minutes Intravenous  Once 11/29/22 1347 11/29/22 1659        Medications  Scheduled Meds:  atorvastatin  10 mg Oral Daily   donepezil  5 mg Oral QHS   escitalopram  20 mg Oral Daily   feeding supplement  237 mL Oral BID BM   heparin injection (subcutaneous)  5,000 Units Subcutaneous Q8H   influenza vaccine adjuvanted  0.5 mL Intramuscular Tomorrow-1000   insulin aspart  0-15 Units Subcutaneous TID WC   insulin aspart  0-5 Units Subcutaneous QHS   pantoprazole  40 mg Oral Daily   Continuous Infusions:  sodium chloride 75 mL/hr at 11/30/22 1434    ampicillin (OMNIPEN) IV 2 g (12/01/22 1540)   PRN Meds:.acetaminophen **OR** acetaminophen, albuterol, haloperidol, hydrOXYzine, ondansetron **OR** ondansetron (ZOFRAN) IV, traZODone    Subjective:   Thomas Finley was seen and examined today.  Slightly agitated.   Objective:   Vitals:   11/30/22 0911 11/30/22 1432 12/01/22 0604 12/01/22 1425  BP: 103/87 (!) 176/91 106/62 (!) 146/76  Pulse: 90 80 75 79  Resp: 20 18 14 20   Temp: 98.7 F (37.1 C) 98.7 F (37.1 C) 97.8 F (36.6 C) 98.8 F (37.1 C)  TempSrc:   Oral Oral  SpO2: 100% 99% 95% 100%  Weight:      Height:        Intake/Output Summary (Last 24 hours) at 12/01/2022 1606 Last data filed at 12/01/2022 1329 Gross per 24 hour  Intake 949.93 ml  Output 500 ml  Net 449.93 ml   Filed Weights   11/29/22 1416 11/29/22 1506  Weight: 87.1 kg 87.1 kg     Exam General exam: elderly gentleman, not in distress.  Respiratory system: Clear to auscultation. Respiratory effort normal. Cardiovascular system: S1 & S2 heard, RRR.  Gastrointestinal system: Abdomen is nondistended, soft and nontender.  Central nervous system: Alert , able to follow simple commands.  Extremities: Symmetric 5 x 5 power. Skin: No rashes,     Data Reviewed:  I have personally reviewed following labs and imaging studies   CBC Lab Results  Component Value Date   WBC 9.6 12/01/2022   RBC 3.67 (L) 12/01/2022   HGB 10.3 (L) 12/01/2022   HCT 31.8 (L) 12/01/2022   MCV 86.6 12/01/2022   MCH 28.1 12/01/2022   PLT 246 12/01/2022   MCHC 32.4 12/01/2022   RDW 15.2 12/01/2022   LYMPHSABS 2.3 12/01/2022   MONOABS 0.7 12/01/2022   EOSABS 0.2 12/01/2022   BASOSABS 0.1 12/01/2022     Last metabolic panel Lab Results  Component Value Date   NA 138 12/01/2022   K 3.5 12/01/2022   CL 102 12/01/2022   CO2 25 12/01/2022   BUN 23 12/01/2022   CREATININE 1.69 (H) 12/01/2022   GLUCOSE 114 (H) 12/01/2022   GFRNONAA 44 (L) 12/01/2022   GFRAA 83  01/12/2018   CALCIUM 7.9 (L) 12/01/2022   PROT 7.7 11/29/2022   ALBUMIN 3.5 11/29/2022   LABGLOB 3.6  01/12/2018   AGRATIO 1.3 01/12/2018   BILITOT 0.4 11/29/2022   ALKPHOS 67 11/29/2022   AST 27 11/29/2022   ALT 25 11/29/2022   ANIONGAP 11 12/01/2022    CBG (last 3)  Recent Labs    11/30/22 2141 12/01/22 0751 12/01/22 1152  GLUCAP 131* 96 130*      Coagulation Profile: Recent Labs  Lab 11/29/22 1354  INR 1.1     Radiology Studies: ECHOCARDIOGRAM COMPLETE  Result Date: 11/30/2022    ECHOCARDIOGRAM REPORT   Patient Name:   Christoffer Arceneaux Date of Exam: 11/30/2022 Medical Rec #:  [id number]    Height:       73.0 in Accession #:    [id number]   Weight:       192.0 lb Date of Birth:  [birth year]    BSA:          2.115 m Patient Age:    67 years     BP:           126/82 mmHg Patient Gender: M            HR:           86 bpm. Exam Location:  Inpatient Procedure: 2D Echo, Color Doppler and Cardiac Doppler Indications:    Bacteremia  History:        Patient has no prior history of Echocardiogram examinations.                 Signs/Symptoms:Bacteremia; Risk Factors:Diabetes and                 Hypertension.  Sonographer:    Milbert Coulter Referring Phys: [id number] MIR M [facility]  Sonographer Comments: Image acquisition challenging due to uncooperative patient and Image acquisition challenging due to respiratory motion. IMPRESSIONS  1. Technically difficult study. Left ventricular ejection fraction, by estimation, is 70 to 75%. The left ventricle has hyperdynamic function. The left ventricle has no regional wall motion abnormalities. There is mild left ventricular hypertrophy. Left  ventricular diastolic parameters are indeterminate.  2. Right ventricular systolic function is normal. The right ventricular size is normal. Tricuspid regurgitation signal is inadequate for assessing PA pressure.  3. The mitral valve is grossly normal. Trivial mitral valve regurgitation.  4. The aortic valve was not well  visualized. Aortic valve regurgitation is mild. No aortic stenosis is present.  5. The inferior vena cava is normal in size with greater than 50% respiratory variability, suggesting right atrial pressure of 3 mmHg. FINDINGS  Left Ventricle: Left ventricular ejection fraction, by estimation, is 70 to 75%. The left ventricle has hyperdynamic function. The left ventricle has no regional wall motion abnormalities. The left ventricular internal cavity size was normal in size. There is mild left ventricular hypertrophy. Left ventricular diastolic parameters are indeterminate. Right Ventricle: The right ventricular size is normal. No increase in right ventricular wall thickness. Right ventricular systolic function is normal. Tricuspid regurgitation signal is inadequate for assessing PA pressure. Left Atrium: Left atrial size was normal in size. Right Atrium: Right atrial size was normal in size. Pericardium: There is no evidence of pericardial effusion. Mitral Valve: The mitral valve is grossly normal. Trivial mitral valve regurgitation. Tricuspid Valve: The tricuspid valve is grossly normal. Tricuspid valve regurgitation is trivial. Aortic Valve: The aortic valve was not well visualized. Aortic valve regurgitation is mild. No aortic stenosis is present. Aortic valve mean gradient measures 4.0 mmHg. Aortic valve peak gradient measures 7.6 mmHg. Aortic valve area, by VTI measures 3.06  cm. Pulmonic Valve: The pulmonic valve was not well visualized. Pulmonic valve regurgitation is not visualized. Aorta: The aortic root and ascending aorta are structurally normal, with no evidence of dilitation. Venous: The inferior vena cava is normal in size with greater than 50% respiratory variability, suggesting right atrial pressure of 3 mmHg. IAS/Shunts: The interatrial septum was not well visualized.  LEFT VENTRICLE PLAX 2D LVIDd:         4.40 cm   Diastology LVIDs:         2.60 cm   LV e' medial:    7.62 cm/s LV PW:         1.20 cm    LV E/e' medial:  10.3 LV IVS:        1.00 cm   LV e' lateral:   8.27 cm/s LVOT diam:     2.10 cm   LV E/e' lateral: 9.5 LV SV:         83 LV SV Index:   39 LVOT Area:     3.46 cm  RIGHT VENTRICLE RV Basal diam:  3.00 cm RV Mid diam:    2.50 cm RV S prime:     22.50 cm/s TAPSE (M-mode): 2.4 cm LEFT ATRIUM             Index        RIGHT ATRIUM           Index LA diam:        3.50 cm 1.66 cm/m   RA Area:     16.80 cm LA Vol (A2C):   51.5 ml 24.35 ml/m  RA Volume:   43.40 ml  20.52 ml/m LA Vol (A4C):   31.8 ml 15.04 ml/m LA Biplane Vol: 41.1 ml 19.44 ml/m  AORTIC VALVE AV Area (Vmax):    3.11 cm AV Area (Vmean):   3.09 cm AV Area (VTI):     3.06 cm AV Vmax:           138.00 cm/s AV Vmean:          96.400 cm/s AV VTI:            0.272 m AV Peak Grad:      7.6 mmHg AV Mean Grad:      4.0 mmHg LVOT Vmax:         124.00 cm/s LVOT Vmean:        86.100 cm/s LVOT VTI:          0.240 m LVOT/AV VTI ratio: 0.88  AORTA Ao Root diam: 3.70 cm Ao Asc diam:  3.70 cm MITRAL VALVE MV Area (PHT): 3.39 cm     SHUNTS MV Decel Time: 224 msec     Systemic VTI:  0.24 m MV E velocity: 78.80 cm/s   Systemic Diam: 2.10 cm MV A velocity: 110.00 cm/s MV E/A ratio:  0.72 Epifanio Lesches MD Electronically signed by Epifanio Lesches MD Signature Date/Time: 11/30/2022/7:19:57 PM    Final    DG Foot 2 Views Right  Result Date: 11/29/2022 CLINICAL DATA:  Right foot wound, cellulitis, altered mental status EXAM: RIGHT FOOT - 2 VIEW COMPARISON:  None Available. FINDINGS: Normal alignment. No acute fracture or dislocation. Joint spaces are preserved. No osseous erosions or abnormal periosteal reaction. Mild periarticular soft tissue swelling surrounding the first metatarsophalangeal joint. Vascular calcifications are noted. Tiny plantar and superior calcaneal spurs are seen. IMPRESSION: 1. Mild periarticular soft tissue swelling surrounding the first metatarsophalangeal joint. No acute fracture or dislocation. Electronically  Signed    By: Helyn Numbers M.D.   On: 11/29/2022 19:19       Kathlen Mody M.D. [facility] 12/01/2022, 4:06 PM  Available via Epic secure chat 7am-7pm After 7 pm, please refer to night coverage provider listed on amion.

## 2022-12-01 NOTE — Progress Notes (Signed)
Mobility Specialist - Progress Note   12/01/22 0928  Mobility  Activity Ambulated with assistance in hallway  Level of Assistance Contact guard assist, steadying assist  Assistive Device Front wheel walker  Distance Ambulated (ft) 10 ft  Activity Response Tolerated fair  Mobility Referral Yes  $Mobility charge 1 Mobility  Mobility Specialist Start Time (ACUTE ONLY) 0908  Mobility Specialist Stop Time (ACUTE ONLY) [id number]  Mobility Specialist Time Calculation (min) (ACUTE ONLY) 19 min   Pt received EOB and agreeable to mobility. Once in hallway pt became agitated requiring verbal cues back to room and into recliner. No complaints during session. Pt to recliner after session with all needs met.    [facility]

## 2022-12-01 NOTE — Progress Notes (Signed)
ID brief note ( weekend coverage)   Remains afebrile  9/6 blood cultures GPC and GPR in 1 set and GPC in another set. GPR could be a contaminant and will wait to be Id'ed  9/5 urine cx 80,000 E faecalis   TTE 9/7 limited study with no vegetations and will need TEE   2  sets of blood cx ordered today Continue IV ampicillin  Dr Daiva Eves to start following from 9/9  Thomas Fraction, MD Infectious Disease Physician [facility] [address] [place], [place] [address] Phone: [phone]  Fax: [phone]

## 2022-12-02 ENCOUNTER — Inpatient Hospital Stay (HOSPITAL_COMMUNITY): Payer: Medicare Other

## 2022-12-02 DIAGNOSIS — G300 Alzheimer's disease with early onset: Secondary | ICD-10-CM | POA: Diagnosis not present

## 2022-12-02 DIAGNOSIS — E1122 Type 2 diabetes mellitus with diabetic chronic kidney disease: Secondary | ICD-10-CM | POA: Diagnosis not present

## 2022-12-02 DIAGNOSIS — N1831 Chronic kidney disease, stage 3a: Secondary | ICD-10-CM | POA: Diagnosis not present

## 2022-12-02 DIAGNOSIS — E11621 Type 2 diabetes mellitus with foot ulcer: Secondary | ICD-10-CM | POA: Diagnosis not present

## 2022-12-02 DIAGNOSIS — B952 Enterococcus as the cause of diseases classified elsewhere: Secondary | ICD-10-CM | POA: Diagnosis not present

## 2022-12-02 DIAGNOSIS — F0280 Dementia in other diseases classified elsewhere without behavioral disturbance: Secondary | ICD-10-CM

## 2022-12-02 DIAGNOSIS — R7881 Bacteremia: Secondary | ICD-10-CM | POA: Diagnosis not present

## 2022-12-02 DIAGNOSIS — L97519 Non-pressure chronic ulcer of other part of right foot with unspecified severity: Secondary | ICD-10-CM

## 2022-12-02 LAB — BASIC METABOLIC PANEL
Anion gap: 10 (ref 5–15)
BUN: 23 mg/dL (ref 8–23)
CO2: 26 mmol/L (ref 22–32)
Calcium: 8.2 mg/dL — ABNORMAL LOW (ref 8.9–10.3)
Chloride: 103 mmol/L (ref 98–111)
Creatinine, Ser: 2.1 mg/dL — ABNORMAL HIGH (ref 0.61–1.24)
GFR, Estimated: 34 mL/min — ABNORMAL LOW (ref >60)
Glucose, Bld: 122 mg/dL — ABNORMAL HIGH (ref 70–99)
Potassium: 4.7 mmol/L (ref 3.5–5.1)
Sodium: 139 mmol/L (ref 135–145)

## 2022-12-02 LAB — GLUCOSE, CAPILLARY
Glucose-Capillary: 112 mg/dL — ABNORMAL HIGH (ref 70–99)
Glucose-Capillary: 92 mg/dL (ref 70–99)
Glucose-Capillary: 142 mg/dL — ABNORMAL HIGH (ref 70–99)
Glucose-Capillary: 108 mg/dL — ABNORMAL HIGH (ref 70–99)
Glucose-Capillary: 119 mg/dL — ABNORMAL HIGH (ref 70–99)

## 2022-12-02 LAB — MAGNESIUM: Magnesium: 1.8 mg/dL (ref 1.7–2.4)

## 2022-12-02 MED ORDER — LORAZEPAM 1 MG PO TABS
1.0000 mg | ORAL_TABLET | Freq: Three times a day (TID) | ORAL | Status: DC | PRN
  Administered 2022-12-02 – 2022-12-03 (×3): 1 mg via ORAL
  Filled 2022-12-02 (×4): qty 1

## 2022-12-02 MED ORDER — HYDRALAZINE HCL 25 MG PO TABS: 25.0000 mg | ORAL_TABLET | Freq: Three times a day (TID) | ORAL | Status: DC | PRN

## 2022-12-02 MED ORDER — SODIUM CHLORIDE 0.9 % IV SOLN
2.0000 g | Freq: Four times a day (QID) | INTRAVENOUS | Status: DC
  Administered 2022-12-02 – 2022-12-03 (×3): 2 g via INTRAVENOUS
  Filled 2022-12-02 (×4): qty 2000

## 2022-12-02 NOTE — Progress Notes (Addendum)
Subjective: No new complaints   Antibiotics:  Anti-infectives (From admission, onward)    Start     Dose/Rate Route Frequency Ordered Stop   12/02/22 2200  ampicillin (OMNIPEN) 2 g in sodium chloride 0.9 % 100 mL IVPB        2 g 300 mL/hr over 20 Minutes Intravenous Every 6 hours 12/02/22 1513     11/30/22 1600  ampicillin (OMNIPEN) 2 g in sodium chloride 0.9 % 100 mL IVPB  Status:  Discontinued        2 g 300 mL/hr over 20 Minutes Intravenous Every 4 hours 11/30/22 1203 12/02/22 1513   11/30/22 0000  ampicillin (OMNIPEN) 2 g in sodium chloride 0.9 % 100 mL IVPB  Status:  Discontinued        2 g 300 mL/hr over 20 Minutes Intravenous Every 6 hours 11/29/22 1516 11/30/22 1203   11/29/22 1400  ceFEPIme (MAXIPIME) 2 g in sodium chloride 0.9 % 100 mL IVPB        2 g 200 mL/hr over 30 Minutes Intravenous  Once 11/29/22 1347 11/29/22 1658   11/29/22 1400  metroNIDAZOLE (FLAGYL) IVPB 500 mg        500 mg 100 mL/hr over 60 Minutes Intravenous  Once 11/29/22 1347 11/29/22 1658   11/29/22 1400  vancomycin (VANCOCIN) IVPB 1000 mg/200 mL premix        1,000 mg 200 mL/hr over 60 Minutes Intravenous  Once 11/29/22 1347 11/29/22 1659       Medications: Scheduled Meds:  atorvastatin  10 mg Oral Daily   donepezil  5 mg Oral QHS   escitalopram  20 mg Oral Daily   feeding supplement  237 mL Oral BID BM   heparin injection (subcutaneous)  5,000 Units Subcutaneous Q8H   influenza vaccine adjuvanted  0.5 mL Intramuscular Tomorrow-1000   insulin aspart  0-15 Units Subcutaneous TID WC   insulin aspart  0-5 Units Subcutaneous QHS   pantoprazole  40 mg Oral Daily   Continuous Infusions:  sodium chloride 75 mL/hr at 12/02/22 1136   ampicillin (OMNIPEN) IV     PRN Meds:.acetaminophen **OR** acetaminophen, albuterol, haloperidol, hydrOXYzine, LORazepam, ondansetron **OR** ondansetron (ZOFRAN) IV, traZODone    Objective: Weight change:   Intake/Output Summary (Last 24 hours) at  12/02/2022 1531 Last data filed at 12/02/2022 1402 Gross per 24 hour  Intake 2679.72 ml  Output 1100 ml  Net 1579.72 ml   Blood pressure (!) 173/90, pulse 72, temperature 98.5 F (36.9 C), temperature source Oral, resp. rate 19, height 6\' 1"  (1.854 m), weight 87.1 kg, SpO2 100%. Temp:  [97.4 F (36.3 C)-98.6 F (37 C)] 98.5 F (36.9 C) (09/09 1139) Pulse Rate:  [65-80] 72 (09/09 1139) Resp:  [16-19] 19 (09/09 1139) BP: (161-177)/(66-90) 173/90 (09/09 1139) SpO2:  [100 %] 100 % (09/09 1139)  Physical Exam: Physical Exam Constitutional:      Appearance: He is well-developed. He is ill-appearing.  HENT:     Head: Normocephalic and atraumatic.  Eyes:     Conjunctiva/sclera: Conjunctivae normal.  Cardiovascular:     Rate and Rhythm: Normal rate and regular rhythm.  Pulmonary:     Effort: Pulmonary effort is normal. No respiratory distress.     Breath sounds: No wheezing.  Abdominal:     General: There is no distension.     Palpations: Abdomen is soft.  Musculoskeletal:        General: Normal range of motion.  Cervical back: Normal range of motion and neck supple.  Skin:    General: Skin is warm and dry.     Findings: No erythema or rash.  Neurological:     General: No focal deficit present.     Mental Status: He is alert and oriented to person, place, and time.  Psychiatric:        Mood and Affect: Mood normal.        Behavior: Behavior normal.        Thought Content: Thought content normal.        Judgment: Judgment normal.      Foot     CBC:    BMET Recent Labs    12/01/22 0419 12/02/22 1229  NA 138 139  K 3.5 4.7  CL 102 103  CO2 25 26  GLUCOSE 114* 122*  BUN 23 23  CREATININE 1.69* 2.10*  CALCIUM 7.9* 8.2*     Liver Panel  No results for input(s): "PROT", "ALBUMIN", "AST", "ALT", "ALKPHOS", "BILITOT", "BILIDIR", "IBILI" in the last 72 hours.     Sedimentation Rate No results for input(s): "ESRSEDRATE" in the last 72 hours. C-Reactive  Protein No results for input(s): "CRP" in the last 72 hours.  Micro Results: Recent Results (from the past 720 hour(s))  Blood culture (routine x 2)     Status: Abnormal   Collection Time: 11/28/22 12:54 PM   Specimen: BLOOD RIGHT ARM  Result Value Ref Range Status   Specimen Description   Final    BLOOD RIGHT ARM Performed at [facility], [address], [place], [place] [address]    Special Requests   Final    BOTTLES DRAWN AEROBIC AND ANAEROBIC Blood Culture results may not be optimal due to an excessive volume of blood received in culture bottles Performed at [facility], [address] [place], [place] [address]    Culture  Setup Time   Final    GRAM POSITIVE COCCI IN BOTH AEROBIC AND ANAEROBIC BOTTLES CRITICAL VALUE NOTED.  VALUE IS CONSISTENT WITH PREVIOUSLY REPORTED AND CALLED VALUE.    Culture (A)  Final    ENTEROCOCCUS FAECALIS SUSCEPTIBILITIES PERFORMED ON PREVIOUS CULTURE WITHIN THE LAST 5 DAYS. Performed at [facility], [address] [place], [place] [address]    Report Status 12/01/2022 FINAL  Final  Blood culture (routine x 2)     Status: Abnormal   Collection Time: 11/28/22  1:58 PM   Specimen: BLOOD  Result Value Ref Range Status   Specimen Description   Final    BLOOD LEFT ANTECUBITAL Performed at [facility], [address] [place], [place] [address]    Special Requests   Final    BOTTLES DRAWN AEROBIC AND ANAEROBIC Blood Culture adequate volume Performed at [facility], [address], [place], [place] [address]    Culture  Setup Time   Final    GRAM POSITIVE COCCI AEROBIC BOTTLE ONLY CRITICAL RESULT CALLED TO, READ BACK BY AND VERIFIED WITH: CHARGE NURSE ED AT [facility] [id number] AT 1031 AM BY CM Performed at [facility], [address], [place], [place] [address]    Culture ENTEROCOCCUS FAECALIS (A)  Final   Report Status 12/01/2022 FINAL  Final    Organism ID, Bacteria ENTEROCOCCUS FAECALIS  Final      Susceptibility   Enterococcus faecalis - MIC*    AMPICILLIN <=2 SENSITIVE Sensitive     VANCOMYCIN 2  SENSITIVE Sensitive     GENTAMICIN SYNERGY SENSITIVE Sensitive     * ENTEROCOCCUS FAECALIS  Blood Culture ID Panel (Reflexed)     Status: Abnormal   Collection Time: 11/28/22  1:58 PM  Result Value Ref Range Status   Enterococcus faecalis DETECTED (A) NOT DETECTED Final    Comment: CRITICAL RESULT CALLED TO, READ BACK BY AND VERIFIED WITH: CHARGE NURSE ED [facility] [id number] AT 1033 AM BY CM    Enterococcus Faecium NOT DETECTED NOT DETECTED Final   Listeria monocytogenes NOT DETECTED NOT DETECTED Final   Staphylococcus species NOT DETECTED NOT DETECTED Final   Staphylococcus aureus (BCID) NOT DETECTED NOT DETECTED Final   Staphylococcus epidermidis NOT DETECTED NOT DETECTED Final   Staphylococcus lugdunensis NOT DETECTED NOT DETECTED Final   Streptococcus species NOT DETECTED NOT DETECTED Final   Streptococcus agalactiae NOT DETECTED NOT DETECTED Final   Streptococcus pneumoniae NOT DETECTED NOT DETECTED Final   Streptococcus pyogenes NOT DETECTED NOT DETECTED Final   A.calcoaceticus-baumannii NOT DETECTED NOT DETECTED Final   Bacteroides fragilis NOT DETECTED NOT DETECTED Final   Enterobacterales NOT DETECTED NOT DETECTED Final   Enterobacter cloacae complex NOT DETECTED NOT DETECTED Final   Escherichia coli NOT DETECTED NOT DETECTED Final   Klebsiella aerogenes NOT DETECTED NOT DETECTED Final   Klebsiella oxytoca NOT DETECTED NOT DETECTED Final   Klebsiella pneumoniae NOT DETECTED NOT DETECTED Final   Proteus species NOT DETECTED NOT DETECTED Final   Salmonella species NOT DETECTED NOT DETECTED Final   Serratia marcescens NOT DETECTED NOT DETECTED Final   Haemophilus influenzae NOT DETECTED NOT DETECTED Final   Neisseria meningitidis NOT DETECTED NOT DETECTED Final   Pseudomonas aeruginosa NOT DETECTED NOT DETECTED Final    Stenotrophomonas maltophilia NOT DETECTED NOT DETECTED Final   Candida albicans NOT DETECTED NOT DETECTED Final   Candida auris NOT DETECTED NOT DETECTED Final   Candida glabrata NOT DETECTED NOT DETECTED Final   Candida krusei NOT DETECTED NOT DETECTED Final   Candida parapsilosis NOT DETECTED NOT DETECTED Final   Candida tropicalis NOT DETECTED NOT DETECTED Final   Cryptococcus neoformans/gattii NOT DETECTED NOT DETECTED Final   Vancomycin resistance NOT DETECTED NOT DETECTED Final    Comment: Performed at [facility], [address], [place], [place] [address]  Urine Culture     Status: Abnormal   Collection Time: 11/28/22  6:20 PM   Specimen: Urine, Clean Catch  Result Value Ref Range Status   Specimen Description   Final    URINE, CLEAN CATCH Performed at [facility], [address], [place], [place] [address]    Special Requests   Final    NONE Performed at [facility], [address], [place], [place] [address]    Culture 80,000 COLONIES/mL ENTEROCOCCUS FAECALIS (A)  Final   Report Status 12/01/2022 FINAL  Final   Organism ID, Bacteria ENTEROCOCCUS FAECALIS (A)  Final      Susceptibility   Enterococcus faecalis - MIC*    AMPICILLIN <=2 SENSITIVE Sensitive     NITROFURANTOIN <=16 SENSITIVE Sensitive     VANCOMYCIN 2 SENSITIVE Sensitive     * 80,000 COLONIES/mL ENTEROCOCCUS FAECALIS  Resp panel by RT-PCR (RSV, Flu A&B, Covid) Peripheral     Status: None   Collection Time: 11/29/22  1:47 PM   Specimen: Peripheral; Nasal Swab  Result Value Ref Range Status   SARS Coronavirus 2 by RT PCR NEGATIVE NEGATIVE Final    Comment: (NOTE) SARS-CoV-2  target nucleic acids are NOT DETECTED.  The SARS-CoV-2 RNA is generally detectable in upper respiratory specimens during the acute phase of infection. The lowest concentration of SARS-CoV-2 viral copies this assay can detect is 138 copies/mL. A negative result does not preclude  SARS-Cov-2 infection and should not be used as the sole basis for treatment or other patient management decisions. A negative result may occur with  improper specimen collection/handling, submission of specimen other than nasopharyngeal swab, presence of viral mutation(s) within the areas targeted by this assay, and inadequate number of viral copies(<138 copies/mL). A negative result must be combined with clinical observations, patient history, and epidemiological information. The expected result is Negative.  Fact Sheet for Patients:  BloggerCourse.com  Fact Sheet for Healthcare Providers:  SeriousBroker.it  This test is no t yet approved or cleared by the [place] FDA and  has been authorized for detection and/or diagnosis of SARS-CoV-2 by FDA under an Emergency Use Authorization (EUA). This EUA will remain  in effect (meaning this test can be used) for the duration of the COVID-19 declaration under Section 564(b)(1) of the Act, 21 U.S.C.section 360bbb-3(b)(1), unless the authorization is terminated  or revoked sooner.       Influenza A by PCR NEGATIVE NEGATIVE Final   Influenza B by PCR NEGATIVE NEGATIVE Final    Comment: (NOTE) The Xpert Xpress SARS-CoV-2/FLU/RSV plus assay is intended as an aid in the diagnosis of influenza from Nasopharyngeal swab specimens and should not be used as a sole basis for treatment. Nasal washings and aspirates are unacceptable for Xpert Xpress SARS-CoV-2/FLU/RSV testing.  Fact Sheet for Patients: BloggerCourse.com  Fact Sheet for Healthcare Providers: SeriousBroker.it  This test is not yet approved or cleared by the [place] FDA and has been authorized for detection and/or diagnosis of SARS-CoV-2 by FDA under an Emergency Use Authorization (EUA). This EUA will remain in effect (meaning this test can be used) for the duration of  the COVID-19 declaration under Section 564(b)(1) of the Act, 21 U.S.C. section 360bbb-3(b)(1), unless the authorization is terminated or revoked.     Resp Syncytial Virus by PCR NEGATIVE NEGATIVE Final    Comment: (NOTE) Fact Sheet for Patients: BloggerCourse.com  Fact Sheet for Healthcare Providers: SeriousBroker.it  This test is not yet approved or cleared by the [place] FDA and has been authorized for detection and/or diagnosis of SARS-CoV-2 by FDA under an Emergency Use Authorization (EUA). This EUA will remain in effect (meaning this test can be used) for the duration of the COVID-19 declaration under Section 564(b)(1) of the Act, 21 U.S.C. section 360bbb-3(b)(1), unless the authorization is terminated or revoked.  Performed at [facility], [address] [place], [place] [address]   Blood Culture (routine x 2)     Status: Abnormal   Collection Time: 11/29/22  1:58 PM   Specimen: BLOOD  Result Value Ref Range Status   Specimen Description   Final    BLOOD RIGHT ANTECUBITAL Performed at [facility], [address] [place], [place] [address]    Special Requests   Final    BOTTLES DRAWN AEROBIC AND ANAEROBIC Blood Culture results may not be optimal due to an inadequate volume of blood received in culture bottles Performed at [facility], [address], [place], [place] [address]    Culture  Setup Time   Final    GRAM POSITIVE COCCI GRAM POSITIVE RODS IN BOTH AEROBIC AND ANAEROBIC BOTTLES Gram Stain Report Called to,Read Back  By and Verified With: PHARMD C. SHADE 11/30/22 @ 0456 BY AB    Culture (A)  Final    ENTEROCOCCUS FAECALIS SUSCEPTIBILITIES PERFORMED ON PREVIOUS CULTURE WITHIN THE LAST 5 DAYS. BACILLUS SPECIES Standardized susceptibility testing for this organism is not available. Performed at [facility], [address], [place], [place] [address]     Report Status 12/01/2022 FINAL  Final  Blood Culture (routine x 2)     Status: Abnormal   Collection Time: 11/29/22  2:07 PM   Specimen: BLOOD  Result Value Ref Range Status   Specimen Description   Final    BLOOD SITE NOT SPECIFIED Performed at [facility], [address], [place], [place] [address]    Special Requests   Final    BOTTLES DRAWN AEROBIC AND ANAEROBIC Blood Culture adequate volume Performed at [facility], [address] [place], [place] [address]    Culture  Setup Time   Final    GRAM POSITIVE COCCI AEROBIC BOTTLE ONLY CRITICAL VALUE NOTED.  VALUE IS CONSISTENT WITH PREVIOUSLY REPORTED AND CALLED VALUE.    Culture (A)  Final    ENTEROCOCCUS FAECALIS SUSCEPTIBILITIES PERFORMED ON PREVIOUS CULTURE WITHIN THE LAST 5 DAYS. Performed at [facility], [address], [place], [place] [address]    Report Status 12/01/2022 FINAL  Final  Culture, blood (Routine X 2) w Reflex to ID Panel     Status: None (Preliminary result)   Collection Time: 12/01/22  1:01 PM   Specimen: BLOOD  Result Value Ref Range Status   Specimen Description   Final    BLOOD BLOOD RIGHT HAND AEROBIC BOTTLE ONLY Performed at [facility], [address] [place], [place] [address]    Special Requests   Final    BOTTLES DRAWN AEROBIC AND ANAEROBIC Blood Culture adequate volume Performed at [facility], [address], [place], [place] [address]    Culture   Final    NO GROWTH < 24 HOURS Performed at [facility], [address], [place], [place] [address]    Report Status PENDING  Incomplete    Studies/Results: No results found.    Assessment/Plan:  INTERVAL HISTORY: TTE limited   Principal Problem:   Enterococcal bacteremia    Thomas Finley is a 67 y.o. 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  He was brought back to the facility and [place] placed on broad-spectrum antibiotics that have been narrowed to ampicillin.   Plain films of the foot are unrevealing  #1  Enterococcal bacteremia:  While he did have 80,000 colony-forming units of Enterococcus faecalis in the urine is not clear to me that the urine was the source.  Concern remains for potential deep infection including on his heart valves.  I put a proceed with an MRI of his foot to see if there is evidence of osteomyelitis as this would warrant protracted therapy alone and if of might obviate the need for transesophageal echocardiogram in terms of deciding on duration of therapy.   #2 Early dementia: I have not investigated workup. I doubt could be related to syphilis but will check RPR for thoroughness.   I have personally spent 50 minutes involved in face-to-face and non-face-to-face activities for this patient on the day  of the visit. Professional time spent includes the following activities: Preparing to see the patient (review of tests), Obtaining and/or reviewing separately obtained history (admission/discharge record), Performing a medically appropriate examination and/or evaluation , Ordering medications/tests/procedures, referring and communicating with other health care professionals, Documenting clinical information in the EMR, Independently interpreting results (not separately reported), Communicating results to the patient/family/caregiver, Counseling and educating the patient/family/caregiver and Care coordination (not separately reported).   Dr. Luciana Axe is covering tomorrow and Wednesday for Thomas Finley.       LOS: 3 days   Thomas Finley 12/02/2022, 3:31 PM

## 2022-12-02 NOTE — Progress Notes (Signed)
PHARMACY NOTE:  ANTIMICROBIAL RENAL DOSAGE ADJUSTMENT  Current antimicrobial regimen includes a mismatch between antimicrobial dosage and estimated renal function.  As per policy approved by the Pharmacy & Therapeutics and Medical Executive Committees, the antimicrobial dosage will be adjusted accordingly.  Current antimicrobial dosage:  Ampicillin 2g IV every 4 hours  Indication: Enterococcus faecalis bacteremia  Renal Function:  Estimated Creatinine Clearance: 38.6 mL/min (A) (by C-G formula based on SCr of 2.1 mg/dL (H)). []      On intermittent HD, scheduled: []      On CRRT    Antimicrobial dosage has been changed to:  Ampicillin 2g IV every 6 hours  Additional comments:   Thank you for allowing pharmacy to be a part of this patient's care.  Georgina Pillion, PharmD, BCPS, BCIDP Infectious Diseases Clinical Pharmacist 12/02/2022 3:12 PM   **Pharmacist phone directory can now be found on [email or web address] ([email or web address]).  Listed under [facility].

## 2022-12-02 NOTE — Progress Notes (Signed)
[facility]                                                                               Darrel Dorton, is a 67 y.o. male, DOB - Apr 15, 1955, ZOX:[id number] Admit date - 11/29/2022    Outpatient Primary MD for the patient is System, Provider Not In  LOS - 3  days    Brief summary    67 y.o. male with medical history significant for dementia, hypertension, diabetes being admitted to the hospital with positive blood culture and altered mental status.  He was brought to the emergency department from his facility due to hypotension, blood pressure responded to IV fluids, urinalysis was marginally abnormal he was started on empiric Keflex and discharged back home after blood cultures were obtained.  Today, his partner was called to bring him back to the hospital due to blood culture positive for Enterococcus faecalis.   He was seen in the emergency department by Dr. Drue Second of infectious disease, who narrowed antibiotic therapy.    Assessment & Plan    Assessment and Plan:  Sepsis secondary to Enterococcus fecalis Suspect urinary source. On IV ampicillin, sensitive to ampicillin.  Continue with IV fluids.  Follow up echocardiogram. No vegetations seen. TEE is scheduled for tomorrow.   X rays of the foot show Mild periarticular soft tissue swelling surrounding the first metatarsophalangeal joint. MRI of the foot ordered.  CXR is negative for infection.  CT abd and pelvis show Patchy ground-glass changes in the bilateral lower lobes, which is nonspecific but can be seen with atypical infection or aspiration. Improving leukocytosis.    Acute metabolic encephalopathy in the setting of dementia and hospital delirium The caregiver at bedside says patient is at baseline mental status.   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    Hyponatremia Resolved.   Type 2 DM:  CBG (last 3)  Recent Labs    12/02/22 0723 12/02/22 1136 12/02/22 1640  GLUCAP 92 142* 108*    Hemoglobin A1c is 6.9% Continue with SSI.    Dementia: Continue with aricept.     Hyperlipidemia:  Resume lipitor.   Agitated, hospital delirium:  - started him prn haldol.    Uncontrolled hypertension:  - added prn hydralazine.      Estimated body mass index is 25.33 kg/m as calculated from the following:   Height as of this encounter: 6\' 1"  (1.854 m).   Weight as of this encounter: 87.1 kg.  Code Status: DNR DVT Prophylaxis:  heparin injection 5,000 Units Start: 11/29/22 2200 SCDs Start: 11/29/22 1751   Level of Care: Level of care: Med-Surg Family Communication: none at bedside.   Procedures:  None.   Consultants:   ID  Cardiology.   Antimicrobials:   Anti-infectives (From admission, onward)    Start     Dose/Rate Route Frequency Ordered Stop   12/02/22 2200  ampicillin (OMNIPEN) 2 g in sodium chloride 0.9 % 100 mL IVPB        2 g 300 mL/hr over 20 Minutes Intravenous Every 6 hours 12/02/22 1513     11/30/22 1600  ampicillin (OMNIPEN) 2 g in sodium chloride 0.9 %  100 mL IVPB  Status:  Discontinued        2 g 300 mL/hr over 20 Minutes Intravenous Every 4 hours 11/30/22 1203 12/02/22 1513   11/30/22 0000  ampicillin (OMNIPEN) 2 g in sodium chloride 0.9 % 100 mL IVPB  Status:  Discontinued        2 g 300 mL/hr over 20 Minutes Intravenous Every 6 hours 11/29/22 1516 11/30/22 1203   11/29/22 1400  ceFEPIme (MAXIPIME) 2 g in sodium chloride 0.9 % 100 mL IVPB        2 g 200 mL/hr over 30 Minutes Intravenous  Once 11/29/22 1347 11/29/22 1658   11/29/22 1400  metroNIDAZOLE (FLAGYL) IVPB 500 mg        500 mg 100 mL/hr over 60 Minutes Intravenous  Once 11/29/22 1347 11/29/22 1658   11/29/22 1400  vancomycin (VANCOCIN) IVPB 1000 mg/200 mL premix        1,000 mg 200 mL/hr over 60 Minutes Intravenous  Once 11/29/22 1347 11/29/22 1659        Medications  Scheduled Meds:  atorvastatin  10 mg Oral Daily   donepezil  5 mg Oral QHS   escitalopram  20 mg  Oral Daily   feeding supplement  237 mL Oral BID BM   heparin injection (subcutaneous)  5,000 Units Subcutaneous Q8H   influenza vaccine adjuvanted  0.5 mL Intramuscular Tomorrow-1000   insulin aspart  0-15 Units Subcutaneous TID WC   insulin aspart  0-5 Units Subcutaneous QHS   pantoprazole  40 mg Oral Daily   Continuous Infusions:  sodium chloride 75 mL/hr at 12/02/22 1136   ampicillin (OMNIPEN) IV     PRN Meds:.acetaminophen **OR** acetaminophen, albuterol, haloperidol, hydrOXYzine, LORazepam, ondansetron **OR** ondansetron (ZOFRAN) IV, traZODone    Subjective:   Kanden Wilmott was seen and examined today.  No events overnight.   Objective:   Vitals:   12/01/22 1425 12/01/22 1954 12/02/22 0454 12/02/22 1139  BP: (!) 146/76 (!) 177/90 (!) 161/66 (!) 173/90  Pulse: 79 80 65 72  Resp: 20 18 16 19   Temp: 98.8 F (37.1 C) (!) 97.4 F (36.3 C) 98.6 F (37 C) 98.5 F (36.9 C)  TempSrc: Oral Oral Oral Oral  SpO2: 100% 100% 100% 100%  Weight:      Height:        Intake/Output Summary (Last 24 hours) at 12/02/2022 1725 Last data filed at 12/02/2022 1700 Gross per 24 hour  Intake 3305 ml  Output 1100 ml  Net 2205 ml   Filed Weights   11/29/22 1416 11/29/22 1506  Weight: 87.1 kg 87.1 kg     Exam General exam: Appears calm and comfortable  Respiratory system: Clear to auscultation. Respiratory effort normal. Cardiovascular system: S1 & S2 heard, RRR.  Gastrointestinal system: Abdomen is nondistended, soft and nontender.  Central nervous system: Alert and comfortable.  Extremities: Symmetric 5 x 5 power. Skin: No rashes, lesions or ulcers Psychiatry:  Mood & affect appropriate.      Data Reviewed:  I have personally reviewed following labs and imaging studies   CBC Lab Results  Component Value Date   WBC 9.6 12/01/2022   RBC 3.67 (L) 12/01/2022   HGB 10.3 (L) 12/01/2022   HCT 31.8 (L) 12/01/2022   MCV 86.6 12/01/2022   MCH 28.1 12/01/2022   PLT 246  12/01/2022   MCHC 32.4 12/01/2022   RDW 15.2 12/01/2022   LYMPHSABS 2.3 12/01/2022   MONOABS 0.7 12/01/2022   EOSABS 0.2 12/01/2022  BASOSABS 0.1 12/01/2022     Last metabolic panel Lab Results  Component Value Date   NA 139 12/02/2022   K 4.7 12/02/2022   CL 103 12/02/2022   CO2 26 12/02/2022   BUN 23 12/02/2022   CREATININE 2.10 (H) 12/02/2022   GLUCOSE 122 (H) 12/02/2022   GFRNONAA 34 (L) 12/02/2022   GFRAA 83 01/12/2018   CALCIUM 8.2 (L) 12/02/2022   PROT 7.7 11/29/2022   ALBUMIN 3.5 11/29/2022   LABGLOB 3.6 01/12/2018   AGRATIO 1.3 01/12/2018   BILITOT 0.4 11/29/2022   ALKPHOS 67 11/29/2022   AST 27 11/29/2022   ALT 25 11/29/2022   ANIONGAP 10 12/02/2022    CBG (last 3)  Recent Labs    12/02/22 0723 12/02/22 1136 12/02/22 1640  GLUCAP 92 142* 108*      Coagulation Profile: Recent Labs  Lab 11/29/22 1354  INR 1.1     Radiology Studies: No results found.     Kathlen Mody M.D. [facility] 12/02/2022, 5:25 PM  Available via Epic secure chat 7am-7pm After 7 pm, please refer to night coverage provider listed on amion.

## 2022-12-02 NOTE — Progress Notes (Signed)
MRI was unable to perform MRI of the right of the right foot. Patient was thrashing too much for it to be safe to do this imaging, even though haldol was given to this patient. Pt was returned to regular floor.

## 2022-12-02 NOTE — Progress Notes (Signed)
    [facility] has been requested to perform a transesophageal echocardiogram on Thomas Finley for evaluation of possible endocarditis with Enterococcus faecalis bacteremia. Patient is not oriented to time, place, event with confusion/dementia and has a legal guardian, Waldemar Dickens.   After careful review of history and examination, the risks and benefits of transesophageal echocardiogram have been explained to patient and his legal guardian including risks of esophageal damage, perforation (1:10,000 risk), bleeding, pharyngeal hematoma as well as other potential complications associated with conscious sedation including aspiration, arrhythmia, respiratory failure and death. Alternatives to treatment were discussed, questions were answered. Patient's legal guardian Waldemar Dickens consented on patient's behalf, willing to proceed.   Perlie Gold PA-C 12/02/2022 4:40 PM

## 2022-12-02 NOTE — TOC Initial Note (Signed)
Transition of Care [facility]) - Initial/Assessment Note   Patient Details  Name: Minus Brigner MRN: [id number] Date of Birth: 1956-02-08  Transition of Care [facility]) CM/SW Contact:    Ewing Schlein, LCSW Phone Number: 12/02/2022, 1:37 PM  Clinical Narrative: CSW spoke with patient's significant other, [name], and confirmed patient is from [place] [facility] and the plan is discharge back with hospice through [facility] when medically ready. CSW left VM for Garlan Fillers at [place] [facility] [place] requesting call back. TOC to follow.                 Expected Discharge Plan: Memory Care ([facility]) Barriers to Discharge: Continued Medical Work up  Patient Goals and CMS Choice Patient states their goals for this hospitalization and ongoing recovery are:: Return to [facility] [place] memory care Choice offered to / list presented to : NA  Expected Discharge Plan and Services In-house Referral: Clinical Social Work Post Acute Care Choice: NA Living arrangements for the past 2 months: Assisted Living Facility (Memory care)           DME Arranged: N/A DME Agency: NA  Prior Living Arrangements/Services Living arrangements for the past 2 months: Assisted Living Facility (Memory care) Lives with:: Facility Resident Patient language and need for interpreter reviewed:: Yes Do you feel safe going back to the place where you live?: Yes      Need for Family Participation in Patient Care: Yes (Comment) Care giver support system in place?: Yes (comment) Criminal Activity/Legal Involvement Pertinent to Current Situation/Hospitalization: No - Comment as needed  Activities of Daily Living Home Assistive Devices/Equipment: Dentures (specify type) ADL Screening (condition at time of admission) Patient's cognitive ability adequate to safely complete daily activities?: No Is the patient deaf or have difficulty hearing?: No Does the patient have difficulty seeing,  even when wearing glasses/contacts?: No Does the patient have difficulty concentrating, remembering, or making decisions?: Yes Patient able to express need for assistance with ADLs?: No Does the patient have difficulty dressing or bathing?: Yes Independently performs ADLs?: No Does the patient have difficulty walking or climbing stairs?: Yes Weakness of Legs: Both Weakness of Arms/Hands: None  Permission Sought/Granted Permission sought to share information with : Facility Industrial/product designer granted to share information with : Yes, Verbal Permission Granted Permission granted to share info w AGENCY: [facility] [place], [facility]  Emotional Assessment Orientation: : Oriented to Self Alcohol / Substance Use: Not Applicable Psych Involvement: No (comment)  Admission diagnosis:  Bacteremia [R78.81] Sepsis, due to unspecified organism, unspecified whether acute organ dysfunction present [facility]) [A41.9] Patient Active Problem List   Diagnosis Date Noted   Enterococcal bacteremia 11/29/2022   Type 2 diabetes mellitus with stage 3 chronic kidney disease, without long-term current use of insulin (HCC) 10/28/2017   Personal history of noncompliance with medical treatment, presenting hazards to health 10/28/2017   Essential hypertension, benign 10/20/2017   PCP:  System, Provider Not In Pharmacy:   [id number] [place], [place] - [address] [place] [address] Phone: [phone] Fax: [phone]  Social Determinants of Health (SDOH) Social History: SDOH Screenings   Food Insecurity: No Food Insecurity (12/01/2022)  Housing: Patient Unable To Answer (12/01/2022)  Transportation Needs: Patient Unable To Answer (12/01/2022)  Utilities: Not At Risk (12/01/2022)  Depression (PHQ2-9): Low Risk  (01/16/2019)  Financial Resource Strain: Low Risk  (07/08/2022)   Received from [facility], [facility]  Physical Activity:  Insufficiently  Active (07/08/2022)   Received from [facility], [facility]  Social Connections: Socially Isolated (07/08/2022)   [facility]  Stress: No Stress Concern Present (07/08/2022)   Received from [facility], [facility]  Tobacco Use: Low Risk  (11/29/2022)  Recent Concern: Tobacco Use - Medium Risk (11/18/2022)   Received from [facility]   SDOH Interventions:    Readmission Risk Interventions     No data to display

## 2022-12-02 NOTE — Plan of Care (Signed)
  Problem: Education: Goal: Knowledge of General Education information will improve Description Including pain rating scale, medication(s)/side effects and non-pharmacologic comfort measures Outcome: Progressing   

## 2022-12-03 ENCOUNTER — Inpatient Hospital Stay (HOSPITAL_COMMUNITY): Payer: Medicare Other

## 2022-12-03 ENCOUNTER — Other Ambulatory Visit (HOSPITAL_COMMUNITY): Payer: Medicare Other

## 2022-12-03 ENCOUNTER — Encounter (HOSPITAL_COMMUNITY)
Admission: EM | Disposition: A | Discharge status: Home / Self Care | Payer: Self-pay | Source: Home / Self Care | Attending: Internal Medicine

## 2022-12-03 DIAGNOSIS — A419 Sepsis, unspecified organism: Secondary | ICD-10-CM | POA: Diagnosis not present

## 2022-12-03 DIAGNOSIS — N1831 Chronic kidney disease, stage 3a: Secondary | ICD-10-CM | POA: Diagnosis not present

## 2022-12-03 DIAGNOSIS — E1122 Type 2 diabetes mellitus with diabetic chronic kidney disease: Secondary | ICD-10-CM | POA: Diagnosis not present

## 2022-12-03 LAB — GLUCOSE, CAPILLARY
Glucose-Capillary: 120 mg/dL — ABNORMAL HIGH (ref 70–99)
Glucose-Capillary: 111 mg/dL — ABNORMAL HIGH (ref 70–99)

## 2022-12-03 SURGERY — INVASIVE LAB ABORTED CASE
Anesthesia: Monitor Anesthesia Care

## 2022-12-03 MED ORDER — SODIUM CHLORIDE 0.9 % IV SOLN: INTRAVENOUS | Status: DC

## 2022-12-03 NOTE — TOC Progression Note (Addendum)
Transition of Care [facility]) - Progression Note   Patient Details  Name: Thomas Finley MRN: [id number] Date of Birth: 14-Feb-1956  Transition of Care [facility]) CM/SW Contact  Ewing Schlein, LCSW Phone Number: 12/03/2022, 10:52 AM  Clinical Narrative: CSW provided update to Garlan Fillers at [place] [facility] [place]. Facility will need an FL2 and discharge summary at discharge and will need to be reviewed prior to patient's return.  Addendum: Patient's HCPOA wants the patient to return home with hospice rather than returning to the memory care unit at [facility]. CSW followed up with [name] with [facility].  Expected Discharge Plan: Memory Care ([facility] hosp[facility]) Barriers to Discharge: Continued Medical Work up  Expected Discharge Plan and Services In-house Referral: Clinical Social Work Post Acute Care Choice: NA Living arrangements for the past 2 months: Assisted Press photographer (Memory care)           DME Arranged: N/A DME Agency: NA  Social Determinants of Health (SDOH) Interventions SDOH Screenings   Food Insecurity: No Food Insecurity (12/01/2022)  Housing: Patient Unable To Answer (12/01/2022)  Transportation Needs: Patient Unable To Answer (12/01/2022)  Utilities: Not At Risk (12/01/2022)  Depression (PHQ2-9): Low Risk  (01/16/2019)  Financial Resource Strain: Low Risk  (07/08/2022)   Received from [facility], [facility]  Physical Activity: Insufficiently Active (07/08/2022)   Received from [facility], [facility]  Social Connections: Socially Isolated (07/08/2022)   Received from [facility], [facility]  Stress: No Stress Concern Present (07/08/2022)   Received from [facility], [facility]  Tobacco Use: Low Risk  (11/29/2022)  Recent Concern: Tobacco Use - Medium Risk (11/18/2022)   Received from [facility]   Readmission Risk Interventions     No data to display

## 2022-12-03 NOTE — Progress Notes (Signed)
Brief cardiology progress note:  Patient brought to procedure area for TEE. Patient was agitated, walking around room, urinating on the floor. He required sitter to make sure he was safe. Discussed case with Dr. Luciana Axe. Risk of sedation discussed as well as possible alternatives. Appreciate his assistance. At this time, will cancel TEE, appreciate alternative treatment plan by Dr. Luciana Axe.  Jodelle Red, MD, PhD, [facility]  [facility]  [place]  Heart & Vascular at [facility] at [facility] [address], [address] [place], [place] [address] [phone]

## 2022-12-03 NOTE — Anesthesia Preprocedure Evaluation (Addendum)
Anesthesia Evaluation  Patient identified by MRN, date of birth, ID band Patient confused    Reviewed: Allergy & Precautions, NPO status , Patient's Chart, lab work & pertinent test results  Airway Mallampati: III  TM Distance: >3 FB Neck ROM: Full    Dental  (+) Edentulous Upper, Edentulous Lower   Pulmonary neg pulmonary ROS   Pulmonary exam normal breath sounds clear to auscultation       Cardiovascular hypertension, Pt. on medications Normal cardiovascular exam+ Valvular Problems/Murmurs AI  Rhythm:Regular Rate:Normal  Echo 11/2022  1. Technically difficult study. Left ventricular ejection fraction, by estimation, is 70 to 75%. The left ventricle has hyperdynamic function. The left ventricle has no regional wall motion abnormalities. There is mild left ventricular hypertrophy. Left ventricular diastolic parameters are indeterminate.   2. Right ventricular systolic function is normal. The right ventricular size is normal. Tricuspid regurgitation signal is inadequate for assessing PA pressure.   3. The mitral valve is grossly normal. Trivial mitral valve regurgitation.   4. The aortic valve was not well visualized. Aortic valve regurgitation is mild. No aortic stenosis is present.   5. The inferior vena cava is normal in size with greater than 50% respiratory variability, suggesting right atrial pressure of 3 mmHg.     Neuro/Psych negative neurological ROS     GI/Hepatic negative GI ROS, Neg liver ROS,,,  Endo/Other  diabetes    Renal/GU Renal disease     Musculoskeletal negative musculoskeletal ROS (+)    Abdominal   Peds  Hematology negative hematology ROS (+)   Anesthesia Other Findings   Reproductive/Obstetrics                             Anesthesia Physical Anesthesia Plan  ASA: 3  Anesthesia Plan: MAC   Post-op Pain Management:    Induction: Intravenous  PONV Risk Score and  Plan: 1 and Propofol infusion, TIVA and Treatment may vary due to age or medical condition  Airway Management Planned:   Additional Equipment:   Intra-op Plan:   Post-operative Plan:   Informed Consent: I have reviewed the patients History and Physical, chart, labs and discussed the procedure including the risks, benefits and alternatives for the proposed anesthesia with the patient or authorized representative who has indicated his/her understanding and acceptance.   Patient has DNR.  Discussed DNR with power of attorney and Suspend DNR.   Dental advisory given and Consent reviewed with POA  Plan Discussed with: CRNA  Anesthesia Plan Comments:         Anesthesia Quick Evaluation

## 2022-12-03 NOTE — Plan of Care (Signed)
  Problem: Education: Goal: Knowledge of General Education information will improve Description Including pain rating scale, medication(s)/side effects and non-pharmacologic comfort measures Outcome: Progressing   

## 2022-12-03 NOTE — Plan of Care (Signed)
  Problem: Elimination: Goal: Will not experience complications related to bowel motility Outcome: Progressing Goal: Will not experience complications related to urinary retention Outcome: Progressing   Problem: Pain Managment: Goal: General experience of comfort will improve Outcome: Progressing   

## 2022-12-03 NOTE — Progress Notes (Signed)
[facility]                                                                               Thomas Finley, is a 67 y.o. male, DOB - 17-Oct-1955, IHK:[id number] Admit date - 11/29/2022    Outpatient Primary MD for the patient is System, Provider Not In  LOS - 4  days    Brief summary    67 y.o. male with medical history significant for dementia, hypertension, diabetes being admitted to the hospital with positive blood culture and altered mental status.  He was brought to the emergency department from his facility due to hypotension, blood pressure responded to IV fluids, urinalysis was marginally abnormal he was started on empiric Keflex and discharged back home after blood cultures were obtained.  Today, his partner was called to bring him back to the hospital due to blood culture positive for Enterococcus faecalis.    Assessment & Plan    Assessment and Plan:  Sepsis secondary to Enterococcus fecalis Suspect urinary source.  He was started on IV ampicillin.  Echocardiogram was done but did not show any vegetations.  ID recommended getting an TEE, but was unsuccessful as he was uncooperative.  CXR is negative for infection.  CT abd and pelvis show Patchy ground-glass changes in the bilateral lower lobes, which is nonspecific but can be seen with atypical infection or aspiration.     Acute metabolic encephalopathy in the setting of dementia and hospital delirium   Acute on stage 3a CKD: Baseline creatinine around 1.6 admitted with a creatinine of 3.  Improved to 2.10 with IV fluids.    Hyponatremia Resolved.   Type 2 DM:  CBG (last 3)  Recent Labs    12/02/22 2123 12/03/22 0748 12/03/22 0904  GLUCAP 119* 120* 111*   Hemoglobin A1c is 6.9%    Dementia: Continue with aricept.     Hyperlipidemia:  Resume lipitor.     Uncontrolled hypertension:    Patient's partner and HCPOA at bedside, wanted to transition to stop all we are doing, no blood  draws or medications and interventions. He wants to focus on the patient being comfortable, let him eat what he wants, transition to comfort measures and take him home with hospice from the hospital. He is trying to arrange help at home at nights. He says he has a hospital bed. He is requesting to speak to hospice.    Estimated body mass index is 25.33 kg/m as calculated from the following:   Height as of this encounter: 6\' 1"  (1.854 m).   Weight as of this encounter: 87.1 kg.  Code Status: DNR DVT Prophylaxis:  heparin injection 5,000 Units Start: 11/29/22 2200 SCDs Start: 11/29/22 1751   Level of Care: Level of care: Med-Surg Family Communication: none at bedside.   Procedures:  None.   Consultants:   ID  Cardiology.   Antimicrobials:   Anti-infectives (From admission, onward)    Start     Dose/Rate Route Frequency Ordered Stop   12/02/22 2200  ampicillin (OMNIPEN) 2 g in sodium chloride 0.9 % 100 mL IVPB  Status:  Discontinued        2 g  300 mL/hr over 20 Minutes Intravenous Every 6 hours 12/02/22 1513 12/03/22 1343   11/30/22 1600  ampicillin (OMNIPEN) 2 g in sodium chloride 0.9 % 100 mL IVPB  Status:  Discontinued        2 g 300 mL/hr over 20 Minutes Intravenous Every 4 hours 11/30/22 1203 12/02/22 1513   11/30/22 0000  ampicillin (OMNIPEN) 2 g in sodium chloride 0.9 % 100 mL IVPB  Status:  Discontinued        2 g 300 mL/hr over 20 Minutes Intravenous Every 6 hours 11/29/22 1516 11/30/22 1203   11/29/22 1400  ceFEPIme (MAXIPIME) 2 g in sodium chloride 0.9 % 100 mL IVPB        2 g 200 mL/hr over 30 Minutes Intravenous  Once 11/29/22 1347 11/29/22 1658   11/29/22 1400  metroNIDAZOLE (FLAGYL) IVPB 500 mg        500 mg 100 mL/hr over 60 Minutes Intravenous  Once 11/29/22 1347 11/29/22 1658   11/29/22 1400  vancomycin (VANCOCIN) IVPB 1000 mg/200 mL premix        1,000 mg 200 mL/hr over 60 Minutes Intravenous  Once 11/29/22 1347 11/29/22 1659         Medications  Scheduled Meds:  donepezil  5 mg Oral QHS   escitalopram  20 mg Oral Daily   feeding supplement  237 mL Oral BID BM   heparin injection (subcutaneous)  5,000 Units Subcutaneous Q8H   influenza vaccine adjuvanted  0.5 mL Intramuscular Tomorrow-1000   pantoprazole  40 mg Oral Daily   Continuous Infusions:   PRN Meds:.acetaminophen **OR** acetaminophen, albuterol, haloperidol, hydrALAZINE, hydrOXYzine, LORazepam, ondansetron **OR** ondansetron (ZOFRAN) IV, traZODone    Subjective:   Thomas Finley was seen and examined today.  No events overnight.   Objective:   Vitals:   12/03/22 0456 12/03/22 0846 12/03/22 0912 12/03/22 1047  BP: (!) 163/70 (!) 146/69 (!) 146/69 (!) 188/99  Pulse: 73 71 79 71  Resp: 17 19 14 16   Temp: 98.3 F (36.8 C) 98 F (36.7 C)    TempSrc: Oral Temporal    SpO2: 97% 92% 99% 100%  Weight:  87.1 kg    Height:  6\' 1"  (1.854 m)      Intake/Output Summary (Last 24 hours) at 12/03/2022 1405 Last data filed at 12/03/2022 1051 Gross per 24 hour  Intake 1803.28 ml  Output 1100 ml  Net 703.28 ml   Filed Weights   11/29/22 1416 11/29/22 1506 12/03/22 0846  Weight: 87.1 kg 87.1 kg 87.1 kg     Exam General exam: elderly gentleman, not in distress.  Respiratory system: Clear to auscultation. Respiratory effort normal. Cardiovascular system: S1 & S2 heard, RRR.  Gastrointestinal system: Abdomen is nondistended, soft and nontender.  Central nervous system: Alert , comfortable, oriented to person only.  Extremities: No pedal edema.  Skin: No rashes,  Psychiatry: Unable to assess.       Data Reviewed:  I have personally reviewed following labs and imaging studies   CBC Lab Results  Component Value Date   WBC 9.6 12/01/2022   RBC 3.67 (L) 12/01/2022   HGB 10.3 (L) 12/01/2022   HCT 31.8 (L) 12/01/2022   MCV 86.6 12/01/2022   MCH 28.1 12/01/2022   PLT 246 12/01/2022   MCHC 32.4 12/01/2022   RDW 15.2 12/01/2022   LYMPHSABS  2.3 12/01/2022   MONOABS 0.7 12/01/2022   EOSABS 0.2 12/01/2022   BASOSABS 0.1 12/01/2022     Last metabolic panel  Lab Results  Component Value Date   NA 139 12/02/2022   K 4.7 12/02/2022   CL 103 12/02/2022   CO2 26 12/02/2022   BUN 23 12/02/2022   CREATININE 2.10 (H) 12/02/2022   GLUCOSE 122 (H) 12/02/2022   GFRNONAA 34 (L) 12/02/2022   GFRAA 83 01/12/2018   CALCIUM 8.2 (L) 12/02/2022   PROT 7.7 11/29/2022   ALBUMIN 3.5 11/29/2022   LABGLOB 3.6 01/12/2018   AGRATIO 1.3 01/12/2018   BILITOT 0.4 11/29/2022   ALKPHOS 67 11/29/2022   AST 27 11/29/2022   ALT 25 11/29/2022   ANIONGAP 10 12/02/2022    CBG (last 3)  Recent Labs    12/02/22 2123 12/03/22 0748 12/03/22 0904  GLUCAP 119* 120* 111*      Coagulation Profile: Recent Labs  Lab 11/29/22 1354  INR 1.1     Radiology Studies: No results found.     Kathlen Mody M.D. [facility] 12/03/2022, 2:05 PM  Available via Epic secure chat 7am-7pm After 7 pm, please refer to night coverage provider listed on amion.

## 2022-12-03 NOTE — Progress Notes (Signed)
RT notified nurse that they can do the MRI around 11 am if patient is calm. I will tell the HPOA who's in the room with him.  And if the Drs. have decided not to do The TEE at [facility] at this time. Thanks.

## 2022-12-04 DIAGNOSIS — R7881 Bacteremia: Secondary | ICD-10-CM | POA: Diagnosis not present

## 2022-12-04 DIAGNOSIS — Z515 Encounter for palliative care: Secondary | ICD-10-CM

## 2022-12-04 DIAGNOSIS — B952 Enterococcus as the cause of diseases classified elsewhere: Secondary | ICD-10-CM | POA: Diagnosis not present

## 2022-12-04 LAB — CULTURE, BLOOD (ROUTINE X 2)

## 2022-12-04 LAB — CBC WITH DIFFERENTIAL/PLATELET
Abs Immature Granulocytes: 0.03 10*3/uL (ref 0.00–0.07)
Basophils Absolute: 0.1 10*3/uL (ref 0.0–0.1)
Basophils Relative: 1 %
Eosinophils Absolute: 0.4 10*3/uL (ref 0.0–0.5)
Eosinophils Relative: 6 %
HCT: 33 % — ABNORMAL LOW (ref 39.0–52.0)
Hemoglobin: 10.4 g/dL — ABNORMAL LOW (ref 13.0–17.0)
Immature Granulocytes: 0 %
Lymphocytes Relative: 31 %
Lymphs Abs: 2.3 10*3/uL (ref 0.7–4.0)
MCH: 27.5 pg (ref 26.0–34.0)
MCHC: 31.5 g/dL (ref 30.0–36.0)
MCV: 87.3 fL (ref 80.0–100.0)
Monocytes Absolute: 0.6 10*3/uL (ref 0.1–1.0)
Monocytes Relative: 8 %
Neutro Abs: 3.9 10*3/uL (ref 1.7–7.7)
Neutrophils Relative %: 54 %
Platelets: 272 10*3/uL (ref 150–400)
RBC: 3.78 MIL/uL — ABNORMAL LOW (ref 4.22–5.81)
RDW: 15.1 % (ref 11.5–15.5)
WBC: 7.3 10*3/uL (ref 4.0–10.5)
nRBC: 0 % (ref 0.0–0.2)

## 2022-12-04 LAB — BASIC METABOLIC PANEL
Anion gap: 10 (ref 5–15)
BUN: 26 mg/dL — ABNORMAL HIGH (ref 8–23)
CO2: 25 mmol/L (ref 22–32)
Calcium: 8.6 mg/dL — ABNORMAL LOW (ref 8.9–10.3)
Chloride: 102 mmol/L (ref 98–111)
Creatinine, Ser: 2.06 mg/dL — ABNORMAL HIGH (ref 0.61–1.24)
GFR, Estimated: 35 mL/min — ABNORMAL LOW (ref >60)
Glucose, Bld: 128 mg/dL — ABNORMAL HIGH (ref 70–99)
Potassium: 4.1 mmol/L (ref 3.5–5.1)
Sodium: 137 mmol/L (ref 135–145)

## 2022-12-04 MED ORDER — LORAZEPAM 1 MG PO TABS: 1.0000 mg | ORAL_TABLET | Freq: Three times a day (TID) | ORAL | 0 refills | Status: AC | PRN

## 2022-12-04 MED ORDER — ONDANSETRON 4 MG PO TBDP: 4.0000 mg | ORAL_TABLET | Freq: Three times a day (TID) | ORAL | 0 refills | Status: AC | PRN

## 2022-12-04 NOTE — Discharge Summary (Signed)
Physician Discharge Summary   Patient: Thomas Finley MRN: [id number] DOB: 1956/01/10  Admit date:     11/29/2022  Discharge date: {dischdate:26783}  Discharge Physician: Jacquelin Hawking   PCP: System, Provider Not In   Recommendations at discharge:  {Tip this will not be part of the note when signed- Example include specific recommendations for outpatient follow-up, pending tests to follow-up on. (Optional):26781}  ***  Discharge Diagnoses: Principal Problem:   Enterococcal bacteremia  Resolved Problems:   * No resolved hospital problems. [facility] Course: No notes on file  Assessment and Plan: No notes have been filed under this hospital service. Service: Hospitalist     {Tip this will not be part of the note when signed Body mass index is 25.33 kg/m. , ,  (Optional):26781}  {(NOTE) Pain control PDMP Statment (Optional):26782} Consultants: *** Procedures performed: ***  Disposition: {Plan; Disposition:26390} Diet recommendation:  {Diet_Plan:26776} DISCHARGE MEDICATION: Allergies as of 12/04/2022       Reactions   Insulin Glargine-lixisenatide Nausea And Vomiting     Med Rec must be completed prior to using this [facility]***       Discharge Exam: Filed Weights   11/29/22 1416 11/29/22 1506 12/03/22 0846  Weight: 87.1 kg 87.1 kg 87.1 kg   ***  Condition at discharge: {DC Condition:26389}  The results of significant diagnostics from this hospitalization (including imaging, microbiology, ancillary and laboratory) are listed below for reference.   Imaging Studies: ECHOCARDIOGRAM COMPLETE  Result Date: 11/30/2022    ECHOCARDIOGRAM REPORT   Patient Name:   Thomas Finley Date of Exam: 11/30/2022 Medical Rec #:  [id number]    Height:       73.0 in Accession #:    [id number]   Weight:       192.0 lb Date of Birth:  [birth year]    BSA:          2.115 m Patient Age:    67 years     BP:           126/82 mmHg Patient Gender: M            HR:           86 bpm. Exam  Location:  Inpatient Procedure: 2D Echo, Color Doppler and Cardiac Doppler Indications:    Bacteremia  History:        Patient has no prior history of Echocardiogram examinations.                 Signs/Symptoms:Bacteremia; Risk Factors:Diabetes and                 Hypertension.  Sonographer:    Milbert Coulter Referring Phys: [id number] MIR M [facility]  Sonographer Comments: Image acquisition challenging due to uncooperative patient and Image acquisition challenging due to respiratory motion. IMPRESSIONS  1. Technically difficult study. Left ventricular ejection fraction, by estimation, is 70 to 75%. The left ventricle has hyperdynamic function. The left ventricle has no regional wall motion abnormalities. There is mild left ventricular hypertrophy. Left  ventricular diastolic parameters are indeterminate.  2. Right ventricular systolic function is normal. The right ventricular size is normal. Tricuspid regurgitation signal is inadequate for assessing PA pressure.  3. The mitral valve is grossly normal. Trivial mitral valve regurgitation.  4. The aortic valve was not well visualized. Aortic valve regurgitation is mild. No aortic stenosis is present.  5. The inferior vena cava is normal in size with greater than 50% respiratory variability, suggesting right atrial pressure of 3 mmHg.  FINDINGS  Left Ventricle: Left ventricular ejection fraction, by estimation, is 70 to 75%. The left ventricle has hyperdynamic function. The left ventricle has no regional wall motion abnormalities. The left ventricular internal cavity size was normal in size. There is mild left ventricular hypertrophy. Left ventricular diastolic parameters are indeterminate. Right Ventricle: The right ventricular size is normal. No increase in right ventricular wall thickness. Right ventricular systolic function is normal. Tricuspid regurgitation signal is inadequate for assessing PA pressure. Left Atrium: Left atrial size was normal in size. Right Atrium:  Right atrial size was normal in size. Pericardium: There is no evidence of pericardial effusion. Mitral Valve: The mitral valve is grossly normal. Trivial mitral valve regurgitation. Tricuspid Valve: The tricuspid valve is grossly normal. Tricuspid valve regurgitation is trivial. Aortic Valve: The aortic valve was not well visualized. Aortic valve regurgitation is mild. No aortic stenosis is present. Aortic valve mean gradient measures 4.0 mmHg. Aortic valve peak gradient measures 7.6 mmHg. Aortic valve area, by VTI measures 3.06  cm. Pulmonic Valve: The pulmonic valve was not well visualized. Pulmonic valve regurgitation is not visualized. Aorta: The aortic root and ascending aorta are structurally normal, with no evidence of dilitation. Venous: The inferior vena cava is normal in size with greater than 50% respiratory variability, suggesting right atrial pressure of 3 mmHg. IAS/Shunts: The interatrial septum was not well visualized.  LEFT VENTRICLE PLAX 2D LVIDd:         4.40 cm   Diastology LVIDs:         2.60 cm   LV e' medial:    7.62 cm/s LV PW:         1.20 cm   LV E/e' medial:  10.3 LV IVS:        1.00 cm   LV e' lateral:   8.27 cm/s LVOT diam:     2.10 cm   LV E/e' lateral: 9.5 LV SV:         83 LV SV Index:   39 LVOT Area:     3.46 cm  RIGHT VENTRICLE RV Basal diam:  3.00 cm RV Mid diam:    2.50 cm RV S prime:     22.50 cm/s TAPSE (M-mode): 2.4 cm LEFT ATRIUM             Index        RIGHT ATRIUM           Index LA diam:        3.50 cm 1.66 cm/m   RA Area:     16.80 cm LA Vol (A2C):   51.5 ml 24.35 ml/m  RA Volume:   43.40 ml  20.52 ml/m LA Vol (A4C):   31.8 ml 15.04 ml/m LA Biplane Vol: 41.1 ml 19.44 ml/m  AORTIC VALVE AV Area (Vmax):    3.11 cm AV Area (Vmean):   3.09 cm AV Area (VTI):     3.06 cm AV Vmax:           138.00 cm/s AV Vmean:          96.400 cm/s AV VTI:            0.272 m AV Peak Grad:      7.6 mmHg AV Mean Grad:      4.0 mmHg LVOT Vmax:         124.00 cm/s LVOT Vmean:         86.100 cm/s LVOT VTI:  0.240 m LVOT/AV VTI ratio: 0.88  AORTA Ao Root diam: 3.70 cm Ao Asc diam:  3.70 cm MITRAL VALVE MV Area (PHT): 3.39 cm     SHUNTS MV Decel Time: 224 msec     Systemic VTI:  0.24 m MV E velocity: 78.80 cm/s   Systemic Diam: 2.10 cm MV A velocity: 110.00 cm/s MV E/A ratio:  0.72 Epifanio Lesches MD Electronically signed by Epifanio Lesches MD Signature Date/Time: 11/30/2022/7:19:57 PM    Final    DG Foot 2 Views Right  Result Date: 11/29/2022 CLINICAL DATA:  Right foot wound, cellulitis, altered mental status EXAM: RIGHT FOOT - 2 VIEW COMPARISON:  None Available. FINDINGS: Normal alignment. No acute fracture or dislocation. Joint spaces are preserved. No osseous erosions or abnormal periosteal reaction. Mild periarticular soft tissue swelling surrounding the first metatarsophalangeal joint. Vascular calcifications are noted. Tiny plantar and superior calcaneal spurs are seen. IMPRESSION: 1. Mild periarticular soft tissue swelling surrounding the first metatarsophalangeal joint. No acute fracture or dislocation. Electronically Signed   By: Helyn Numbers M.D.   On: 11/29/2022 19:19   CT Head Wo Contrast  Result Date: 11/29/2022 CLINICAL DATA:  Mental status change of unknown cause. Found unresponsive by EMS. EXAM: CT HEAD WITHOUT CONTRAST TECHNIQUE: Contiguous axial images were obtained from the base of the skull through the vertex without intravenous contrast. RADIATION DOSE REDUCTION: This exam was performed according to the departmental dose-optimization program which includes automated exposure control, adjustment of the mA and/or kV according to patient size and/or use of iterative reconstruction technique. COMPARISON:  11/14/2022 FINDINGS: Brain: Ventricles, cisterns and other CSF spaces are within normal. There is chronic ischemic microvascular disease. There is no mass, mass effect, shift of midline structures or acute hemorrhage. No evidence of acute infarction.  Vascular: No hyperdense vessel or unexpected calcification. Skull: Normal. Negative for fracture or focal lesion. Sinuses/Orbits: No acute finding. Other: None. IMPRESSION: 1. No acute findings. 2. Chronic ischemic microvascular disease. Electronically Signed   By: Elberta Fortis M.D.   On: 11/29/2022 17:07   DG Chest Port 1 View  Result Date: 11/29/2022 CLINICAL DATA:  Sepsis. EXAM: PORTABLE CHEST 1 VIEW COMPARISON:  X-ray 11/28/2022 FINDINGS: No consolidation, pneumothorax or effusion. No edema. Normal cardiopericardial silhouette. Hand obscures the left costophrenic angle. Film is under penetrated. IMPRESSION: No acute cardiopulmonary disease. Electronically Signed   By: Karen Kays M.D.   On: 11/29/2022 16:58   CT ABDOMEN PELVIS WO CONTRAST  Result Date: 11/28/2022 CLINICAL DATA:  Kidney failure, acute Sepsis hypotension, AKI. EXAM: CT ABDOMEN AND PELVIS WITHOUT CONTRAST TECHNIQUE: Multidetector CT imaging of the abdomen and pelvis was performed following the standard protocol without IV contrast. RADIATION DOSE REDUCTION: This exam was performed according to the departmental dose-optimization program which includes automated exposure control, adjustment of the mA and/or kV according to patient size and/or use of iterative reconstruction technique. COMPARISON:  None Available. FINDINGS: Examination is limited due to patient's motion during data acquisition. Lower chest: There are patchy ground-glass changes in the bilateral lower lobes, which is nonspecific but can be seen with atypical infection or aspiration. There are multiple hyperattenuating nodules in the right lung lower lobe, posterior inferiorly, nonspecific but can be seen as a sequela of previous aspiration of hyperattenuating contents. No pleural effusion. The heart is normal in size. No pericardial effusion. Hepatobiliary: The liver is normal in size. Non-cirrhotic configuration. No suspicious mass. No intrahepatic or extrahepatic bile duct  dilation. No calcified gallstones. Normal gallbladder wall thickness. No  pericholecystic inflammatory changes. Pancreas: Unremarkable. No pancreatic ductal dilatation or surrounding inflammatory changes. Spleen: Within normal limits. No focal lesion. Adrenals/Urinary Tract: Adrenal glands are unremarkable. No suspicious renal mass. No hydronephrosis. There is a 4 mm nonobstructing calculus in the left kidney interpolar region. There are at least 2, punctate nonobstructing calculi in the right kidney. No other nephroureterolithiasis on either side. Unremarkable urinary bladder. Stomach/Bowel: No disproportionate dilation of the small or large bowel loops. No evidence of abnormal bowel wall thickening or inflammatory changes. The appendix is unremarkable. There are multiple diverticula mainly in the sigmoid colon, without imaging signs of diverticulitis. Vascular/Lymphatic: No ascites or pneumoperitoneum. No abdominal or pelvic lymphadenopathy, by size criteria. No aneurysmal dilation of the major abdominal arteries. There are mild peripheral atherosclerotic vascular calcifications of the aorta and its major branches. Reproductive: Normal size prostate. Symmetric seminal vesicles. Other: There are small fat containing umbilical and left inguinal hernias. The soft tissues and abdominal wall are otherwise unremarkable. Musculoskeletal: No suspicious osseous lesions. There are mild multilevel degenerative changes in the visualized spine. IMPRESSION: 1. No acute inflammatory process identified within the abdomen or pelvis. 2. Bilateral nonobstructing nephrolithiasis. 3. Patchy ground-glass changes in the bilateral lower lobes, which is nonspecific but can be seen with atypical infection or aspiration. 4. Multiple other nonacute observations, as described above. Aortic Atherosclerosis (ICD10-I70.0). Electronically Signed   By: Jules Schick M.D.   On: 11/28/2022 16:22   DG Chest 2 View  Result Date: 11/28/2022 CLINICAL  DATA:  Dementia.  Hypertension EXAM: CHEST - 2 VIEW COMPARISON:  X-ray 11/13/2022 FINDINGS: Stable cardiopericardial silhouette. No consolidation, pneumothorax or effusion. No edema. Overlapping cardiac leads. Film is under penetrated. IMPRESSION: No acute cardiopulmonary disease Electronically Signed   By: Karen Kays M.D.   On: 11/28/2022 13:52   CT Head Wo Contrast  Result Date: 11/14/2022 CLINICAL DATA:  Weekend diaphoretic.  Mental status change. EXAM: CT HEAD WITHOUT CONTRAST TECHNIQUE: Contiguous axial images were obtained from the base of the skull through the vertex without intravenous contrast. RADIATION DOSE REDUCTION: This exam was performed according to the departmental dose-optimization program which includes automated exposure control, adjustment of the mA and/or kV according to patient size and/or use of iterative reconstruction technique. COMPARISON:  None Available. FINDINGS: Brain: Motion degrades detail. No intracranial hemorrhage, mass effect, or evidence of acute infarct. No hydrocephalus. No extra-axial fluid collection. Age-commensurate cerebral atrophy and ill-defined hypoattenuation within the cerebral white matter consistent with chronic small vessel ischemic disease. Vascular: No hyperdense vessel. Intracranial arterial calcification. Skull: No definite fracture though evaluation is compromised by motion. Possible left forehead contusion. Sinuses/Orbits: No acute finding. Paranasal sinuses and mastoid air cells are well aerated. Other: None. IMPRESSION: Motion degraded exam despite repeat scanning. No definite acute intracranial abnormality. Possible left forehead contusion. Electronically Signed   By: Minerva Fester M.D.   On: 11/14/2022 04:01   DG Chest Port 1 View  Result Date: 11/14/2022 CLINICAL DATA:  Weakness, hypoglycemia, and diabetic foot wound with ulcer at the base of the big toe plantar surface. EXAM: PORTABLE CHEST 1 VIEW RIGHT FOOT, AP AND LATERAL VIEWS  COMPARISON:  None. FINDINGS: Chest AP portable, 2:12 a.m.: There is mild cardiomegaly without evidence of CHF. The mediastinum is normally outlined. The lungs are clear. The sulci are sharp. There is osteopenia. Thoracic spondylosis. Multiple overlying monitor wires. Right foot, AP and lateral only: There is mild osteopenia without evidence of fractures or acute osteomyelitis. There is mild hallux valgus. Mild nonerosive arthrosis of  the toe joints, first MTP joint. There is a small plantar calcaneal spur. There is dystrophic calcification in the most distal Achilles tendon. There are mild features of midfoot arthrosis. There are vascular calcifications in the distal foreleg and foot. On the lateral view, a subcutaneous subcentimeter air droplet is noted underlying the hallux sesamoid bones. This could possibly be the location of the ulcer. There is mild thickening of the surrounding skin. IMPRESSION: 1. No evidence of acute chest disease.  Mild cardiomegaly. 2. Osteopenia without evidence of right foot fractures or acute osteomyelitis. 3. Mild hallux valgus with midfoot and forefoot arthrosis. 4. Peripheral vascular disease. 5. Dystrophic calcification in the most distal Achilles tendon. 6. Subcentimeter subcutaneous air droplet underlying the hallux sesamoid bones on the lateral view. This could possibly be the location of the ulcer. Electronically Signed   By: Almira Bar M.D.   On: 11/14/2022 02:38   DG Foot 2 Views Right  Result Date: 11/14/2022 CLINICAL DATA:  Weakness, hypoglycemia, and diabetic foot wound with ulcer at the base of the big toe plantar surface. EXAM: PORTABLE CHEST 1 VIEW RIGHT FOOT, AP AND LATERAL VIEWS COMPARISON:  None. FINDINGS: Chest AP portable, 2:12 a.m.: There is mild cardiomegaly without evidence of CHF. The mediastinum is normally outlined. The lungs are clear. The sulci are sharp. There is osteopenia. Thoracic spondylosis. Multiple overlying monitor wires. Right foot, AP and  lateral only: There is mild osteopenia without evidence of fractures or acute osteomyelitis. There is mild hallux valgus. Mild nonerosive arthrosis of the toe joints, first MTP joint. There is a small plantar calcaneal spur. There is dystrophic calcification in the most distal Achilles tendon. There are mild features of midfoot arthrosis. There are vascular calcifications in the distal foreleg and foot. On the lateral view, a subcutaneous subcentimeter air droplet is noted underlying the hallux sesamoid bones. This could possibly be the location of the ulcer. There is mild thickening of the surrounding skin. IMPRESSION: 1. No evidence of acute chest disease.  Mild cardiomegaly. 2. Osteopenia without evidence of right foot fractures or acute osteomyelitis. 3. Mild hallux valgus with midfoot and forefoot arthrosis. 4. Peripheral vascular disease. 5. Dystrophic calcification in the most distal Achilles tendon. 6. Subcentimeter subcutaneous air droplet underlying the hallux sesamoid bones on the lateral view. This could possibly be the location of the ulcer. Electronically Signed   By: Almira Bar M.D.   On: 11/14/2022 02:38    Microbiology: Results for orders placed or performed during the hospital encounter of 11/29/22  Resp panel by RT-PCR (RSV, Flu A&B, Covid) Peripheral     Status: None   Collection Time: 11/29/22  1:47 PM   Specimen: Peripheral; Nasal Swab  Result Value Ref Range Status   SARS Coronavirus 2 by RT PCR NEGATIVE NEGATIVE Final    Comment: (NOTE) SARS-CoV-2 target nucleic acids are NOT DETECTED.  The SARS-CoV-2 RNA is generally detectable in upper respiratory specimens during the acute phase of infection. The lowest concentration of SARS-CoV-2 viral copies this assay can detect is 138 copies/mL. A negative result does not preclude SARS-Cov-2 infection and should not be used as the sole basis for treatment or other patient management decisions. A negative result may occur with   improper specimen collection/handling, submission of specimen other than nasopharyngeal swab, presence of viral mutation(s) within the areas targeted by this assay, and inadequate number of viral copies(<138 copies/mL). A negative result must be combined with clinical observations, patient history, and epidemiological information. The expected result is  Negative.  Fact Sheet for Patients:  BloggerCourse.com  Fact Sheet for Healthcare Providers:  SeriousBroker.it  This test is no t yet approved or cleared by the [place] FDA and  has been authorized for detection and/or diagnosis of SARS-CoV-2 by FDA under an Emergency Use Authorization (EUA). This EUA will remain  in effect (meaning this test can be used) for the duration of the COVID-19 declaration under Section 564(b)(1) of the Act, 21 U.S.C.section 360bbb-3(b)(1), unless the authorization is terminated  or revoked sooner.       Influenza A by PCR NEGATIVE NEGATIVE Final   Influenza B by PCR NEGATIVE NEGATIVE Final    Comment: (NOTE) The Xpert Xpress SARS-CoV-2/FLU/RSV plus assay is intended as an aid in the diagnosis of influenza from Nasopharyngeal swab specimens and should not be used as a sole basis for treatment. Nasal washings and aspirates are unacceptable for Xpert Xpress SARS-CoV-2/FLU/RSV testing.  Fact Sheet for Patients: BloggerCourse.com  Fact Sheet for Healthcare Providers: SeriousBroker.it  This test is not yet approved or cleared by the [place] FDA and has been authorized for detection and/or diagnosis of SARS-CoV-2 by FDA under an Emergency Use Authorization (EUA). This EUA will remain in effect (meaning this test can be used) for the duration of the COVID-19 declaration under Section 564(b)(1) of the Act, 21 U.S.C. section 360bbb-3(b)(1), unless the authorization is terminated or revoked.      Resp Syncytial Virus by PCR NEGATIVE NEGATIVE Final    Comment: (NOTE) Fact Sheet for Patients: BloggerCourse.com  Fact Sheet for Healthcare Providers: SeriousBroker.it  This test is not yet approved or cleared by the [place] FDA and has been authorized for detection and/or diagnosis of SARS-CoV-2 by FDA under an Emergency Use Authorization (EUA). This EUA will remain in effect (meaning this test can be used) for the duration of the COVID-19 declaration under Section 564(b)(1) of the Act, 21 U.S.C. section 360bbb-3(b)(1), unless the authorization is terminated or revoked.  Performed at [facility], [address] [place], [place] [address]   Blood Culture (routine x 2)     Status: Abnormal   Collection Time: 11/29/22  1:58 PM   Specimen: BLOOD  Result Value Ref Range Status   Specimen Description   Final    BLOOD RIGHT ANTECUBITAL Performed at [facility], [address], [place], [place] [address]    Special Requests   Final    BOTTLES DRAWN AEROBIC AND ANAEROBIC Blood Culture results may not be optimal due to an inadequate volume of blood received in culture bottles Performed at [facility], [address], [place], [place] [address]    Culture  Setup Time   Final    GRAM POSITIVE COCCI GRAM POSITIVE RODS IN BOTH AEROBIC AND ANAEROBIC BOTTLES Gram Stain Report Called to,Read Back By and Verified With: PHARMD C. SHADE 11/30/22 @ 0456 BY AB    Culture (A)  Final    ENTEROCOCCUS FAECALIS SUSCEPTIBILITIES PERFORMED ON PREVIOUS CULTURE WITHIN THE LAST 5 DAYS. BACILLUS SPECIES Standardized susceptibility testing for this organism is not available. Performed at [facility], [address] [place], [place] [address]    Report Status 12/01/2022 FINAL  Final  Blood Culture (routine x 2)     Status: Abnormal   Collection Time: 11/29/22  2:07 PM   Specimen: BLOOD   Result Value Ref Range Status   Specimen Description   Final    BLOOD SITE NOT SPECIFIED Performed at [facility], [address] [place], [place] [address]    Special Requests   Final    BOTTLES DRAWN AEROBIC AND ANAEROBIC Blood Culture adequate volume Performed at [facility], [address] [place], [place] [address]    Culture  Setup Time   Final    GRAM POSITIVE COCCI AEROBIC BOTTLE ONLY CRITICAL VALUE NOTED.  VALUE IS CONSISTENT WITH PREVIOUSLY REPORTED AND CALLED VALUE.    Culture (A)  Final    ENTEROCOCCUS FAECALIS SUSCEPTIBILITIES PERFORMED ON PREVIOUS CULTURE WITHIN THE LAST 5 DAYS. Performed at [facility], [address], [place], [place] [address]    Report Status 12/01/2022 FINAL  Final  Culture, blood (Routine X 2) w Reflex to ID Panel     Status: None (Preliminary result)   Collection Time: 12/01/22  1:01 PM   Specimen: BLOOD  Result Value Ref Range Status   Specimen Description   Final    BLOOD BLOOD RIGHT HAND AEROBIC BOTTLE ONLY Performed at [facility], [address], [place], [place] [address]    Special Requests   Final    BOTTLES DRAWN AEROBIC ONLY Blood Culture results may not be optimal due to an inadequate volume of blood received in culture bottles Performed at [facility], [address] [place], [place] [address]    Culture   Final    NO GROWTH 3 DAYS Performed at [facility], [address] [place], [place] [address]    Report Status PENDING  Incomplete  Culture, blood (Routine X 2) w Reflex to ID Panel     Status: None (Preliminary result)   Collection Time: 12/01/22  1:01 PM   Specimen: BLOOD  Result Value Ref Range Status   Specimen Description   Final    BLOOD BLOOD RIGHT HAND AEROBIC BOTTLE ONLY Performed at [facility], [address] [place], [place] [address]    Special Requests   Final    BOTTLES DRAWN AEROBIC  AND ANAEROBIC Blood Culture adequate volume Performed at [facility], [address], [place], [place] [address]    Culture   Final    NO GROWTH 3 DAYS Performed at [facility], [address] [place], [place] [address]    Report Status PENDING  Incomplete    Labs: CBC: Recent Labs  Lab 11/28/22 1217 11/29/22 1354 11/29/22 1407 11/30/22 0439 12/01/22 0419 12/04/22 0454  WBC 16.0* 17.0*  --  13.4* 9.6 7.3  NEUTROABS 13.2* 12.9*  --   --  6.3 3.9  HGB 12.1* 11.3* 12.2* 10.2* 10.3* 10.4*  HCT 37.8* 34.9* 36.0* 32.0* 31.8* 33.0*  MCV 86.3 87.5  --  88.6 86.6 87.3  PLT 342 312  --  250 246 272   Basic Metabolic Panel: Recent Labs  Lab 11/29/22 1354 11/29/22 1407 11/30/22 0439 12/01/22 0419 12/02/22 1229 12/04/22 0454  NA 135 137 137 138 139 137  K 4.6 4.5 3.9 3.5 4.7 4.1  CL 98 101 102 102 103 102  CO2 25  --  24 25 26 25   GLUCOSE 127* 128* 100* 114* 122* 128*  BUN 37* 33* 27* 23 23 26*  CREATININE 2.48* 2.60* 1.62* 1.69* 2.10* 2.06*  CALCIUM 9.1  --  8.6* 7.9* 8.2* 8.6*  MG  --   --   --  1.4* 1.8  --    Liver Function Tests: Recent Labs  Lab 11/28/22 1217 11/29/22 1354  AST 28 27  ALT 30 25  ALKPHOS 76 67  BILITOT 0.5 0.4  PROT 8.3* 7.7  ALBUMIN 3.8 3.5   CBG: Recent Labs  Lab 12/02/22 1136 12/02/22 1640 12/02/22 2123 12/03/22 0748 12/03/22 0904  GLUCAP 142* 108* 119* 120* 111*    Discharge time spent: {LESS THAN/GREATER THAN:26388} 30 minutes.  Signed: Jacquelin Hawking, MD [facility] 12/04/2022

## 2022-12-04 NOTE — Plan of Care (Signed)

## 2022-12-04 NOTE — Care Management Important Message (Signed)
Important Message  Patient Details No IM Letter given due to Hospice at Discharge. Name: Thomas Finley MRN: [id number] Date of Birth: 07-Jan-1956   Medicare Important Message Given:  No     Caren Macadam 12/04/2022, 9:07 AM

## 2022-12-04 NOTE — TOC Transition Note (Signed)
Transition of Care [facility]) - CM/SW Discharge Note  Patient Details  Name: Thomas Finley MRN: [id number] Date of Birth: [birth year]  [facility] CM/SW Contact:  Ewing Schlein, LCSW Phone Number: 12/04/2022, 12:18 PM  Clinical Narrative: Patient will discharge home with hospice services rather than back to [facility] at [facility]. CSW confirmed with significant other, [name], and caregiver that [name] will be providing transportation home. [name] also confirmed the DME from the facility has now been moved to the home. CSW updated [name] with [facility] regarding patient's discharge home today and a nursing visit will be scheduled for patient to be admitted to hospice. TOC signing off.  Final next level of care: Home w Hospice Care Barriers to Discharge: Barriers Resolved  Patient Goals and CMS Choice CMS Medicare.gov Compare Post Acute Care list provided to:: Patient Represenative (must comment) Choice offered to / list presented to : NA  Discharge Plan and Services Additional resources added to the After Visit Summary for   In-house Referral: Clinical Social Work Post Acute Care Choice: NA          DME Arranged: N/A DME Agency: NA  Social Determinants of Health (SDOH) Interventions SDOH Screenings   Food Insecurity: No Food Insecurity (12/01/2022)  Housing: Patient Unable To Answer (12/01/2022)  Transportation Needs: Patient Unable To Answer (12/01/2022)  Utilities: Not At Risk (12/01/2022)  Depression (PHQ2-9): Low Risk  (01/16/2019)  Financial Resource Strain: Low Risk  (07/08/2022)   Received from [facility]  Physical Activity: Insufficiently Active (07/08/2022)   Received from [facility], [facility]  Social Connections: Socially Isolated (07/08/2022)   Received from [facility], [facility]  Stress: No Stress Concern Present (07/08/2022)   Received from [facility], [facility]  Tobacco Use: Low Risk  (11/29/2022)  Recent  Concern: Tobacco Use - Medium Risk (11/18/2022)   Received from [facility]   Readmission Risk Interventions     No data to display

## 2022-12-04 NOTE — Progress Notes (Incomplete)
PROGRESS NOTE    Thomas Finley  KGM:[id number] DOB: [birth year] DOA: 11/29/2022 PCP: System, Provider Not In   Brief Narrative: No notes on file   Assessment and Plan: No notes have been filed under this hospital service. Service: Hospitalist          DVT prophylaxis: *** Code Status:   Code Status: Do not attempt resuscitation (DNR) - Comfort care Family Communication: *** Disposition Plan: ***   Consultants:  ***  Procedures:  ***  Antimicrobials: ***    Subjective: ***  Objective: BP (!) 148/87 (BP Location: Right Arm)   Pulse 77   Temp 97.6 F (36.4 C) (Oral)   Resp 17   Ht 6\' 1"  (1.854 m)   Wt 87.1 kg   SpO2 99%   BMI 25.33 kg/m   Examination:  General exam: Appears calm and comfortable *** Respiratory system: Clear to auscultation. Respiratory effort normal. Cardiovascular system: S1 & S2 heard, RRR. No murmurs, rubs, gallops or clicks. Gastrointestinal system: Abdomen is nondistended, soft and nontender. No organomegaly or masses felt. Normal bowel sounds heard. Central nervous system: Alert and oriented. No focal neurological deficits. Musculoskeletal: No edema. No calf tenderness Skin: No cyanosis. No rashes Psychiatry: Judgement and insight appear normal. Mood & affect appropriate.    Data Reviewed: I have personally reviewed following labs and imaging studies  CBC Lab Results  Component Value Date   WBC 7.3 12/04/2022   RBC 3.78 (L) 12/04/2022   HGB 10.4 (L) 12/04/2022   HCT 33.0 (L) 12/04/2022   MCV 87.3 12/04/2022   MCH 27.5 12/04/2022   PLT 272 12/04/2022   MCHC 31.5 12/04/2022   RDW 15.1 12/04/2022   LYMPHSABS 2.3 12/04/2022   MONOABS 0.6 12/04/2022   EOSABS 0.4 12/04/2022   BASOSABS 0.1 12/04/2022     Last metabolic panel Lab Results  Component Value Date   NA 137 12/04/2022   K 4.1 12/04/2022   CL 102 12/04/2022   CO2 25 12/04/2022   BUN 26 (H) 12/04/2022   CREATININE 2.06 (H) 12/04/2022   GLUCOSE 128 (H)  12/04/2022   GFRNONAA 35 (L) 12/04/2022   GFRAA 83 01/12/2018   CALCIUM 8.6 (L) 12/04/2022   PROT 7.7 11/29/2022   ALBUMIN 3.5 11/29/2022   LABGLOB 3.6 01/12/2018   AGRATIO 1.3 01/12/2018   BILITOT 0.4 11/29/2022   ALKPHOS 67 11/29/2022   AST 27 11/29/2022   ALT 25 11/29/2022   ANIONGAP 10 12/04/2022    GFR: Estimated Creatinine Clearance: 39.3 mL/min (A) (by C-G formula based on SCr of 2.06 mg/dL (H)).  Recent Results (from the past 240 hour(s))  Blood culture (routine x 2)     Status: Abnormal   Collection Time: 11/28/22 12:54 PM   Specimen: BLOOD RIGHT ARM  Result Value Ref Range Status   Specimen Description   Final    BLOOD RIGHT ARM Performed at [facility], [address] [place], [place] [address]    Special Requests   Final    BOTTLES DRAWN AEROBIC AND ANAEROBIC Blood Culture results may not be optimal due to an excessive volume of blood received in culture bottles Performed at [facility], [address], [place], [place] [address]    Culture  Setup Time   Final    GRAM POSITIVE COCCI IN BOTH AEROBIC AND ANAEROBIC BOTTLES CRITICAL VALUE NOTED.  VALUE IS CONSISTENT WITH PREVIOUSLY REPORTED AND CALLED VALUE.    Culture (A)  Final    ENTEROCOCCUS  FAECALIS SUSCEPTIBILITIES PERFORMED ON PREVIOUS CULTURE WITHIN THE LAST 5 DAYS. Performed at [facility], [address], [place], [place] [address]    Report Status 12/04/2022 FINAL  Final  Blood culture (routine x 2)     Status: Abnormal   Collection Time: 11/28/22  1:58 PM   Specimen: BLOOD  Result Value Ref Range Status   Specimen Description   Final    BLOOD LEFT ANTECUBITAL Performed at [facility], [address], [place], [place] [address]    Special Requests   Final    BOTTLES DRAWN AEROBIC AND ANAEROBIC Blood Culture adequate volume Performed at [facility], [address], [place], [place] [address]    Culture  Setup  Time   Final    GRAM POSITIVE COCCI AEROBIC BOTTLE ONLY CRITICAL RESULT CALLED TO, READ BACK BY AND VERIFIED WITH: CHARGE NURSE ED AT [place] [facility] [id number] AT 1031 AM BY CM Performed at [facility], [address], [place], [place] [address]    Culture ENTEROCOCCUS FAECALIS (A)  Final   Report Status 12/01/2022 FINAL  Final   Organism ID, Bacteria ENTEROCOCCUS FAECALIS  Final      Susceptibility   Enterococcus faecalis - MIC*    AMPICILLIN <=2 SENSITIVE Sensitive     VANCOMYCIN 2 SENSITIVE Sensitive     GENTAMICIN SYNERGY SENSITIVE Sensitive     * ENTEROCOCCUS FAECALIS  Blood Culture ID Panel (Reflexed)     Status: Abnormal   Collection Time: 11/28/22  1:58 PM  Result Value Ref Range Status   Enterococcus faecalis DETECTED (A) NOT DETECTED Final    Comment: CRITICAL RESULT CALLED TO, READ BACK BY AND VERIFIED WITH: CHARGE NURSE ED [facility] [id number] AT 1033 AM BY CM    Enterococcus Faecium NOT DETECTED NOT DETECTED Final   Listeria monocytogenes NOT DETECTED NOT DETECTED Final   Staphylococcus species NOT DETECTED NOT DETECTED Final   Staphylococcus aureus (BCID) NOT DETECTED NOT DETECTED Final   Staphylococcus epidermidis NOT DETECTED NOT DETECTED Final   Staphylococcus lugdunensis NOT DETECTED NOT DETECTED Final   Streptococcus species NOT DETECTED NOT DETECTED Final   Streptococcus agalactiae NOT DETECTED NOT DETECTED Final   Streptococcus pneumoniae NOT DETECTED NOT DETECTED Final   Streptococcus pyogenes NOT DETECTED NOT DETECTED Final   A.calcoaceticus-baumannii NOT DETECTED NOT DETECTED Final   Bacteroides fragilis NOT DETECTED NOT DETECTED Final   Enterobacterales NOT DETECTED NOT DETECTED Final   Enterobacter cloacae complex NOT DETECTED NOT DETECTED Final   Escherichia coli NOT DETECTED NOT DETECTED Final   Klebsiella aerogenes NOT DETECTED NOT DETECTED Final   Klebsiella oxytoca NOT DETECTED NOT DETECTED Final   Klebsiella pneumoniae NOT DETECTED NOT DETECTED  Final   Proteus species NOT DETECTED NOT DETECTED Final   Salmonella species NOT DETECTED NOT DETECTED Final   Serratia marcescens NOT DETECTED NOT DETECTED Final   Haemophilus influenzae NOT DETECTED NOT DETECTED Final   Neisseria meningitidis NOT DETECTED NOT DETECTED Final   Pseudomonas aeruginosa NOT DETECTED NOT DETECTED Final   Stenotrophomonas maltophilia NOT DETECTED NOT DETECTED Final   Candida albicans NOT DETECTED NOT DETECTED Final   Candida auris NOT DETECTED NOT DETECTED Final   Candida glabrata NOT DETECTED NOT DETECTED Final   Candida krusei NOT DETECTED NOT DETECTED Final   Candida parapsilosis NOT DETECTED NOT DETECTED Final   Candida tropicalis NOT DETECTED NOT DETECTED Final   Cryptococcus neoformans/gattii NOT DETECTED NOT DETECTED Final   Vancomycin  resistance NOT DETECTED NOT DETECTED Final    Comment: Performed at [facility], [address], [place], [place] [address]  Urine Culture     Status: Abnormal   Collection Time: 11/28/22  6:20 PM   Specimen: Urine, Clean Catch  Result Value Ref Range Status   Specimen Description   Final    URINE, CLEAN CATCH Performed at [facility], [address] [place], [place] [address]    Special Requests   Final    NONE Performed at [facility], [address], [place], [place] [address]    Culture 80,000 COLONIES/mL ENTEROCOCCUS FAECALIS (A)  Final   Report Status 12/01/2022 FINAL  Final   Organism ID, Bacteria ENTEROCOCCUS FAECALIS (A)  Final      Susceptibility   Enterococcus faecalis - MIC*    AMPICILLIN <=2 SENSITIVE Sensitive     NITROFURANTOIN <=16 SENSITIVE Sensitive     VANCOMYCIN 2 SENSITIVE Sensitive     * 80,000 COLONIES/mL ENTEROCOCCUS FAECALIS  Resp panel by RT-PCR (RSV, Flu A&B, Covid) Peripheral     Status: None   Collection Time: 11/29/22  1:47 PM   Specimen: Peripheral; Nasal Swab  Result Value Ref Range Status   SARS Coronavirus 2 by RT PCR NEGATIVE  NEGATIVE Final    Comment: (NOTE) SARS-CoV-2 target nucleic acids are NOT DETECTED.  The SARS-CoV-2 RNA is generally detectable in upper respiratory specimens during the acute phase of infection. The lowest concentration of SARS-CoV-2 viral copies this assay can detect is 138 copies/mL. A negative result does not preclude SARS-Cov-2 infection and should not be used as the sole basis for treatment or other patient management decisions. A negative result may occur with  improper specimen collection/handling, submission of specimen other than nasopharyngeal swab, presence of viral mutation(s) within the areas targeted by this assay, and inadequate number of viral copies(<138 copies/mL). A negative result must be combined with clinical observations, patient history, and epidemiological information. The expected result is Negative.  Fact Sheet for Patients:  BloggerCourse.com  Fact Sheet for Healthcare Providers:  SeriousBroker.it  This test is no t yet approved or cleared by the [place] FDA and  has been authorized for detection and/or diagnosis of SARS-CoV-2 by FDA under an Emergency Use Authorization (EUA). This EUA will remain  in effect (meaning this test can be used) for the duration of the COVID-19 declaration under Section 564(b)(1) of the Act, 21 U.S.C.section 360bbb-3(b)(1), unless the authorization is terminated  or revoked sooner.       Influenza A by PCR NEGATIVE NEGATIVE Final   Influenza B by PCR NEGATIVE NEGATIVE Final    Comment: (NOTE) The Xpert Xpress SARS-CoV-2/FLU/RSV plus assay is intended as an aid in the diagnosis of influenza from Nasopharyngeal swab specimens and should not be used as a sole basis for treatment. Nasal washings and aspirates are unacceptable for Xpert Xpress SARS-CoV-2/FLU/RSV testing.  Fact Sheet for Patients: BloggerCourse.com  Fact Sheet for Healthcare  Providers: SeriousBroker.it  This test is not yet approved or cleared by the [place] FDA and has been authorized for detection and/or diagnosis of SARS-CoV-2 by FDA under an Emergency Use Authorization (EUA). This EUA will remain in effect (meaning this test can be used) for the duration of the COVID-19 declaration under Section 564(b)(1) of the Act, 21 U.S.C. section 360bbb-3(b)(1), unless the authorization is terminated or revoked.     Resp Syncytial Virus by PCR NEGATIVE NEGATIVE Final    Comment: (NOTE)  Fact Sheet for Patients: BloggerCourse.com  Fact Sheet for Healthcare Providers: SeriousBroker.it  This test is not yet approved or cleared by the [place] FDA and has been authorized for detection and/or diagnosis of SARS-CoV-2 by FDA under an Emergency Use Authorization (EUA). This EUA will remain in effect (meaning this test can be used) for the duration of the COVID-19 declaration under Section 564(b)(1) of the Act, 21 U.S.C. section 360bbb-3(b)(1), unless the authorization is terminated or revoked.  Performed at [facility], [address], [place], [place] [address]   Blood Culture (routine x 2)     Status: Abnormal   Collection Time: 11/29/22  1:58 PM   Specimen: BLOOD  Result Value Ref Range Status   Specimen Description   Final    BLOOD RIGHT ANTECUBITAL Performed at [facility], [address] [place], [place] [address]    Special Requests   Final    BOTTLES DRAWN AEROBIC AND ANAEROBIC Blood Culture results may not be optimal due to an inadequate volume of blood received in culture bottles Performed at [facility], [address] [place], [place] [address]    Culture  Setup Time   Final    GRAM POSITIVE COCCI GRAM POSITIVE RODS IN BOTH AEROBIC AND ANAEROBIC BOTTLES Gram Stain Report Called to,Read Back By and Verified  With: PHARMD C. SHADE 11/30/22 @ 0456 BY AB    Culture (A)  Final    ENTEROCOCCUS FAECALIS SUSCEPTIBILITIES PERFORMED ON PREVIOUS CULTURE WITHIN THE LAST 5 DAYS. BACILLUS SPECIES Standardized susceptibility testing for this organism is not available. Performed at [facility], [address], [place], [place] [address]    Report Status 12/01/2022 FINAL  Final  Blood Culture (routine x 2)     Status: Abnormal   Collection Time: 11/29/22  2:07 PM   Specimen: BLOOD  Result Value Ref Range Status   Specimen Description   Final    BLOOD SITE NOT SPECIFIED Performed at [facility], [address], [place], [place] [address]    Special Requests   Final    BOTTLES DRAWN AEROBIC AND ANAEROBIC Blood Culture adequate volume Performed at [facility], [address], [place], [place] [address]    Culture  Setup Time   Final    GRAM POSITIVE COCCI AEROBIC BOTTLE ONLY CRITICAL VALUE NOTED.  VALUE IS CONSISTENT WITH PREVIOUSLY REPORTED AND CALLED VALUE.    Culture (A)  Final    ENTEROCOCCUS FAECALIS SUSCEPTIBILITIES PERFORMED ON PREVIOUS CULTURE WITHIN THE LAST 5 DAYS. Performed at [facility], [address] [place], [place] [address]    Report Status 12/01/2022 FINAL  Final  Culture, blood (Routine X 2) w Reflex to ID Panel     Status: None (Preliminary result)   Collection Time: 12/01/22  1:01 PM   Specimen: BLOOD  Result Value Ref Range Status   Specimen Description   Final    BLOOD BLOOD RIGHT HAND AEROBIC BOTTLE ONLY Performed at [facility], [address] [place], [place] [address]    Special Requests   Final    BOTTLES DRAWN AEROBIC ONLY Blood Culture results may not be optimal due to an inadequate volume of blood received in culture bottles Performed at [facility], [address] [place], [place] [address]    Culture   Final    NO GROWTH 3 DAYS Performed at [facility],  [address], [place], [place] [address]    Report Status PENDING  Incomplete  Culture, blood (Routine X 2) w Reflex to ID Panel     Status: None (Preliminary result)   Collection Time: 12/01/22  1:01 PM   Specimen: BLOOD  Result Value Ref Range Status   Specimen Description   Final    BLOOD BLOOD RIGHT HAND AEROBIC BOTTLE ONLY Performed at [facility], [address] [place], [place] [address]    Special Requests   Final    BOTTLES DRAWN AEROBIC AND ANAEROBIC Blood Culture adequate volume Performed at [facility], [address] [place], [place] [address]    Culture   Final    NO GROWTH 3 DAYS Performed at [facility], [address], [place], [place] [address]    Report Status PENDING  Incomplete      Radiology Studies: No results found.    LOS: 5 days    Jacquelin Hawking, MD [facility] 12/04/2022, 10:13 AM   If 7PM-7AM, please contact night-coverage [email or web address]

## 2022-12-04 NOTE — Progress Notes (Signed)
Reviewed written d/c instructions w pt's POA, papers placed in d/c packet (along w DNR form) and given to POA, to give to pt's Hospice nurse in the am. Pt d/c'ed via w/c w all belongings in stable condition w POA, who will provide private transportation to home.

## 2022-12-04 NOTE — Discharge Instructions (Signed)
Thomas Finley,  You have decided to transition to hospice care. You will be discharged home with hospice.

## 2022-12-05 DIAGNOSIS — R410 Disorientation, unspecified: Secondary | ICD-10-CM | POA: Insufficient documentation

## 2022-12-05 DIAGNOSIS — A419 Sepsis, unspecified organism: Secondary | ICD-10-CM | POA: Insufficient documentation

## 2022-12-05 DIAGNOSIS — E871 Hypo-osmolality and hyponatremia: Secondary | ICD-10-CM | POA: Insufficient documentation

## 2022-12-05 NOTE — Hospital Course (Signed)
Thomas Finley is a 67 y.o. male with a history of dementia, hypertension, diabetes mellitus.  Patient presented secondary to positive blood cultures and found to have sepsis. Patient empirically treated with Vancomycin/Cefepime/Flagyl with quick transition to ampicillin. ID consulted. Endocarditis workup initiated but was discontinued after transition to comfort measures. Patient discharged home with hospice.

## 2022-12-06 LAB — CULTURE, BLOOD (ROUTINE X 2)
Culture: NO GROWTH
Culture: NO GROWTH
Special Requests: ADEQUATE

## 2022-12-18 ENCOUNTER — Encounter (INDEPENDENT_AMBULATORY_CARE_PROVIDER_SITE_OTHER): Payer: Medicare Other | Admitting: Ophthalmology

## 2022-12-23 ENCOUNTER — Encounter (INDEPENDENT_AMBULATORY_CARE_PROVIDER_SITE_OTHER): Payer: Medicare Other | Admitting: Ophthalmology

## 2023-06-27 ENCOUNTER — Emergency Department (HOSPITAL_COMMUNITY)
Admission: EM | Admit: 2023-06-27 | Discharge: 2023-06-27 | Disposition: A | Discharge status: Home / Self Care | Attending: Emergency Medicine | Admitting: Emergency Medicine

## 2023-06-27 ENCOUNTER — Other Ambulatory Visit: Payer: Self-pay

## 2023-06-27 DIAGNOSIS — N189 Chronic kidney disease, unspecified: Secondary | ICD-10-CM | POA: Insufficient documentation

## 2023-06-27 DIAGNOSIS — R55 Syncope and collapse: Secondary | ICD-10-CM | POA: Diagnosis present

## 2023-06-27 DIAGNOSIS — F0390 Unspecified dementia without behavioral disturbance: Secondary | ICD-10-CM | POA: Diagnosis not present

## 2023-06-27 DIAGNOSIS — I129 Hypertensive chronic kidney disease with stage 1 through stage 4 chronic kidney disease, or unspecified chronic kidney disease: Secondary | ICD-10-CM | POA: Insufficient documentation

## 2023-06-27 DIAGNOSIS — E1122 Type 2 diabetes mellitus with diabetic chronic kidney disease: Secondary | ICD-10-CM | POA: Insufficient documentation

## 2023-06-27 DIAGNOSIS — I951 Orthostatic hypotension: Secondary | ICD-10-CM

## 2023-06-27 LAB — BASIC METABOLIC PANEL WITH GFR
Anion gap: 8 (ref 5–15)
BUN: 34 mg/dL — ABNORMAL HIGH (ref 8–23)
CO2: 27 mmol/L (ref 22–32)
Calcium: 9.1 mg/dL (ref 8.9–10.3)
Chloride: 105 mmol/L (ref 98–111)
Creatinine, Ser: 1.77 mg/dL — ABNORMAL HIGH (ref 0.61–1.24)
GFR, Estimated: 41 mL/min — ABNORMAL LOW (ref >60)
Glucose, Bld: 86 mg/dL (ref 70–99)
Potassium: 4.6 mmol/L (ref 3.5–5.1)
Sodium: 140 mmol/L (ref 135–145)

## 2023-06-27 LAB — CBC
HCT: 35.9 % — ABNORMAL LOW (ref 39.0–52.0)
Hemoglobin: 11.1 g/dL — ABNORMAL LOW (ref 13.0–17.0)
MCH: 28.6 pg (ref 26.0–34.0)
MCHC: 30.9 g/dL (ref 30.0–36.0)
MCV: 92.5 fL (ref 80.0–100.0)
Platelets: 232 10*3/uL (ref 150–400)
RBC: 3.88 MIL/uL — ABNORMAL LOW (ref 4.22–5.81)
RDW: 14.8 % (ref 11.5–15.5)
WBC: 10.4 10*3/uL (ref 4.0–10.5)
nRBC: 0 % (ref 0.0–0.2)

## 2023-06-27 MED ORDER — LACTATED RINGERS IV BOLUS
1000.0000 mL | Freq: Once | INTRAVENOUS | Status: AC
  Administered 2023-06-27: 1000 mL via INTRAVENOUS

## 2023-06-27 NOTE — ED Provider Notes (Signed)
 [place] [facility] AT [facility] Provider Note   CSN: [id number] Arrival date & time: 06/27/23  1309     History  Chief Complaint  Patient presents with   Loss of Consciousness    Thomas Finley is a 68 y.o. male.  Patient is a 68 year old male with a past medical history of mild dementia, diabetes, Hypertension, CKD and bipolar disorder presenting to the emergency department with syncope.  Patient is here with his guardian and caretaker.  Patient's caretaker reports that she was with him this afternoon and he was sitting at the table eating his lunch.  She states that she was doing dishes in the kitchen when he suddenly slumped over in his chair.  She states that she initially tried talking to him and he responded with his name and then became unresponsive.  Hospice nurse was additionally at the house at that time and was unable to wake him up and 911 was called.  Patient was initially hypotensive to the 80s on arrival and was given fluids and route it is back to his baseline once he arrived to the emergency department.  Blood sugar and route was normal.  The patient states that he has no memory of the syncopal event and denies any recent dizziness, chest pain or shortness of breath.  He states he has been eating and drinking normally without any recent fevers or illnesses.  Of note his POA did say that he had a urinary tract infection a few weeks ago and is unsure if he ever completed his antibiotics.  The history is provided by the patient and a caregiver.  Loss of Consciousness      Home Medications Prior to Admission medications   Medication Sig Start Date End Date Taking? Authorizing Provider  LORazepam (ATIVAN) 1 MG tablet Take 1 tablet (1 mg total) by mouth every 8 (eight) hours as needed for anxiety. 12/04/22   Narda Bonds, MD  ondansetron (ZOFRAN-ODT) 4 MG disintegrating tablet Take 1 tablet (4 mg total) by mouth every 8 (eight) hours as needed for  nausea or vomiting. 12/04/22   Narda Bonds, MD      Allergies    Insulin glargine-lixisenatide    Review of Systems   Review of Systems  Cardiovascular:  Positive for syncope.    Physical Exam Updated Vital Signs BP (!) 144/94 (BP Location: Right Arm)   Pulse 82   Temp 97.9 F (36.6 C) (Oral)   Resp 18   Ht 6\' 1"  (1.854 m)   Wt 90.7 kg   SpO2 99%   BMI 26.39 kg/m  Physical Exam Vitals and nursing note reviewed.  Constitutional:      General: He is not in acute distress.    Appearance: Normal appearance.  HENT:     Head: Normocephalic and atraumatic.     Nose: Nose normal.     Mouth/Throat:     Mouth: Mucous membranes are moist.     Pharynx: Oropharynx is clear.  Eyes:     Extraocular Movements: Extraocular movements intact.     Conjunctiva/sclera: Conjunctivae normal.  Cardiovascular:     Rate and Rhythm: Normal rate and regular rhythm.     Heart sounds: Normal heart sounds.  Pulmonary:     Effort: Pulmonary effort is normal.     Breath sounds: Normal breath sounds.  Abdominal:     General: Abdomen is flat.     Palpations: Abdomen is soft.     Tenderness: There  is no abdominal tenderness.  Musculoskeletal:        General: Normal range of motion.     Cervical back: Normal range of motion.  Skin:    General: Skin is warm and dry.  Neurological:     General: No focal deficit present.     Mental Status: He is alert and oriented to person, place, and time.     Sensory: No sensory deficit.     Motor: No weakness.  Psychiatric:        Mood and Affect: Mood normal.        Behavior: Behavior normal.     ED Results / Procedures / Treatments   Labs (all labs ordered are listed, but only abnormal results are displayed) Labs Reviewed  BASIC METABOLIC PANEL WITH GFR - Abnormal; Notable for the following components:      Result Value   BUN 34 (*)    Creatinine, Ser 1.77 (*)    GFR, Estimated 41 (*)    All other components within normal limits  CBC -  Abnormal; Notable for the following components:   RBC 3.88 (*)    Hemoglobin 11.1 (*)    HCT 35.9 (*)    All other components within normal limits  URINALYSIS, ROUTINE W REFLEX MICROSCOPIC  RAPID URINE DRUG SCREEN, HOSP PERFORMED  CBG MONITORING, ED    EKG EKG Interpretation Date/Time:  Friday June 27 2023 13:15:38 EDT Ventricular Rate:  83 PR Interval:  189 QRS Duration:  86 QT Interval:  372 QTC Calculation: 438 R Axis:   75  Text Interpretation: Sinus rhythm No significant change was found Confirmed by Elayne Snare ([id number]) on 06/27/2023 1:59:55 PM  Radiology No results found.  Procedures Procedures    Medications Ordered in ED Medications  lactated ringers bolus 1,000 mL (1,000 mLs Intravenous New Bag/Given 06/27/23 1418)    ED Course/ Medical Decision Making/ A&P Clinical Course as of 06/27/23 1628  Fri Jun 27, 2023  1548 Labs within normal range/at baseline. Urine is pending. Will discuss with family if they would want cardiology consult/possible admission in the setting of his high risk chest pain while on hospice.  [VK]  1556 I spoke with patient and POA and they would not be interested in admission to pursue further syncope work up, are agreeable for outpatient cardiology evaluation but states would not be interested in any type of surgical procedures while on hospice.  [VK]  1604 Orthostatics positive, patient asymptomatic. Will be given compression stockings. [VK]  1627 Patient signed out to Dr. Doran Durand pending UA with likely plan for dc home. [VK]    Clinical Course User Index [VK] Rexford Maus, DO                                 Medical Decision Making This patient presents to the ED with chief complaint(s) of syncope with pertinent past medical history of bipolar disorder, mild cognitive impairment, hypertension, diabetes, CKD which further complicates the presenting complaint. The complaint involves an extensive differential diagnosis and also  carries with it a high risk of complications and morbidity.    The differential diagnosis includes arrhythmia, anemia, dehydration, electrolyte abnormality, no headache or neurodeficits making ICH, mass effect, CVA or TIA unlikely, orthostatic hypotension  Additional history obtained: Additional history obtained from EMS  Records reviewed Care Everywhere/External Records  ED Course and Reassessment: On patient's arrival he is hemodynamically stable in no  acute distress and at his neurologic baseline.  EKG on arrival showed normal sinus rhythm without acute ischemic changes.  Patient will have labs and glucose performed.  Will be given fluids here and will be closely reassessed.  Independent labs interpretation:  The following labs were independently interpreted: within normal range, urine pending  Independent visualization of imaging: - N/A     Amount and/or Complexity of Data Reviewed Labs: ordered.          Final Clinical Impression(s) / ED Diagnoses Final diagnoses:  Syncope, unspecified syncope type  Orthostatic hypotension    Rx / DC Orders ED Discharge Orders          Ordered    Compression stockings        06/27/23 1605    Ambulatory referral to Cardiology       Comments: If you have not heard from the Cardiology office within the next 72 hours please call [phone].   06/27/23 1605              Theresia Lo Lennon K, DO 06/27/23 1628

## 2023-06-27 NOTE — ED Provider Notes (Signed)
 Care of patient received from prior provider at 4:32 PM, please see their note for complete H/P and care plan.  Received handoff per ED course.  Clinical Course as of 06/27/23 1633  Fri Jun 27, 2023  1548 Labs within normal range/at baseline. Urine is pending. Will discuss with family if they would want cardiology consult/possible admission in the setting of his high risk chest pain while on hospice.  [VK]  1556 I spoke with patient and POA and they would not be interested in admission to pursue further syncope work up, are agreeable for outpatient cardiology evaluation but states would not be interested in any type of surgical procedures while on hospice.  [VK]  1604 Orthostatics positive, patient asymptomatic. Will be given compression stockings. [VK]  1627 Patient signed out to Dr. Doran Durand pending UA with likely plan for dc home. [VK]  1631 Stable  HO [facility] On home hospice. For ESRD/Dementia. Syncope event.  Orthostatic/hypotensive responsive to fluids They want discharge rather than admit.  Recent UTI uncertain of medication compliance. [CC]    Clinical Course User Index [CC] Glyn Ade, MD [VK] Rexford Maus, DO    Reassessment: On reassessment.  Patient still has not provided urine sample.  Discussed with family at bedside.  They do not want to continue waiting.  They state they will follow-up with her PCP for repeating of this. They deny any new symptoms.  They state they feel comfortable with outpatient care and management.     Glyn Ade, MD 06/27/23 2328

## 2023-06-27 NOTE — Discharge Instructions (Addendum)
 You were seen in the emergency department after episode of passing out.  Your blood pressure was low when you passed out and did drop when you went from lying to sitting to standing here in the emergency department.  Given you prescription of compression stockings you should wear when you are up and walking and make sure you take your time when you go from sitting and standing to prevent yourself from passing out.  I have given you a referral to follow-up with cardiology for further syncope workup.  You can otherwise follow-up with your primary doctor.  You should return to the emergency department if you have recurrent episodes of passing out, severe chest pain, you injure yourself or passing out or any other new or concerning symptoms.

## 2023-06-27 NOTE — Progress Notes (Signed)
 [facility] liaison note     This patient is a current hospice patient with [facility].    Liaison will continue to follow for any discharge planning needs and to coordinate continuation of hospice care.    Please don't hesitate to call with any Hospice related questions or concerns.    Thank you for the opportunity to participate in this patient's care.  Glenna Fellows, BSN, RN, OCN [employer] [phone] (Current Hospice Medication List)

## 2023-06-27 NOTE — ED Triage Notes (Signed)
 Pt BIBA from home. C/o syncopal episode. Pt witnessed going unconscious- found to be unresponsive w/no radial pulse BP of 84/60 by EMS. Given 300 of fluids- pt Aox4, BP of 114/72.  Pupils are pinpoint, RR 20. Hr 80.  [facility] pt

## 2023-11-04 NOTE — Progress Notes (Signed)
 [facility] - Clinic Note  11/05/2023     CHIEF COMPLAINT Patient presents for Retina Follow Up   HISTORY OF PRESENT ILLNESS: Thomas Finley is a 68 y.o. male who presents to the clinic today for:   HPI     Retina Follow Up   Patient presents with  Diabetic Retinopathy.  In both eyes.  This started 4 years ago.  Duration of 1 year.  Since onset it is stable.  I, the attending physician,  performed the HPI with the patient and updated documentation appropriately.        Comments   Pt states no concerns with vision. Pt denies FOL/floaters/pain. Pt is not using ATS. Pt recently left long term care facility. A1c=6.0 10/30/23       Last edited by Valdemar Rogue, MD on 11/05/2023  4:43 PM.    Pt states he's doing better, he had COVID 3 x and was in hospice twice. Pt has improved. A1C was 6 8.7.25.  Referring physician: No referring provider defined for this encounter.  HISTORICAL INFORMATION:   Selected notes from the MEDICAL RECORD NUMBER Referred by Dr. Lamarr Burkitt for concern of CRVO   CURRENT MEDICATIONS: No current outpatient medications on file. (Ophthalmic Drugs)   No current facility-administered medications for this visit. (Ophthalmic Drugs)   Current Outpatient Medications (Other)  Medication Sig   atorvastatin  (LIPITOR) 10 MG tablet Take 10 mg by mouth.   buPROPion  (WELLBUTRIN  XL) 300 MG 24 hr tablet Take 300 mg by mouth daily.   FLUoxetine (PROZAC) 40 MG capsule Take 40 mg by mouth daily.   insulin  glargine, 1 Unit Dial, (TOUJEO SOLOSTAR) 300 UNIT/ML Solostar Pen Inject into the skin.   insulin  glulisine (APIDRA) 100 UNIT/ML injection Inject 100 Units into the skin 3 (three) times daily before meals.   lisinopril (ZESTRIL) 2.5 MG tablet Take 2.5 mg by mouth daily.   OXcarbazepine (TRILEPTAL) 150 MG tablet Take 150 mg by mouth 2 (two) times daily.   pantoprazole  (PROTONIX ) 40 MG tablet Take 40 mg by mouth daily.   Prucalopride Succinate 2  MG TABS Take by mouth.   valbenazine (INGREZZA) 40 MG capsule Take 40 mg by mouth daily.   LORazepam  (ATIVAN ) 1 MG tablet Take 1 tablet (1 mg total) by mouth every 8 (eight) hours as needed for anxiety. (Patient not taking: Reported on 11/05/2023)   ondansetron  (ZOFRAN -ODT) 4 MG disintegrating tablet Take 1 tablet (4 mg total) by mouth every 8 (eight) hours as needed for nausea or vomiting. (Patient not taking: Reported on 11/05/2023)   No current facility-administered medications for this visit. (Other)   REVIEW OF SYSTEMS: ROS   Positive for: Neurological, Genitourinary, Endocrine, Cardiovascular, Eyes Negative for: Constitutional, Gastrointestinal, Skin, Musculoskeletal, HENT, Respiratory, Psychiatric, Allergic/Imm, Heme/Lymph Last edited by Elnor Avelina RAMAN, COT on 11/05/2023  3:20 PM.     ALLERGIES Allergies  Allergen Reactions   Insulin  Glargine-Lixisenatide Nausea And Vomiting   PAST MEDICAL HISTORY Past Medical History:  Diagnosis Date   Diabetes (HCC)    Diabetic retinopathy (HCC)    NPDR OU   Hypertension    Hypertensive retinopathy    OU   Past Surgical History:  Procedure Laterality Date   CATARACT EXTRACTION Bilateral    Dr. Burkitt   EYE SURGERY Bilateral    Cat Sx - Dr. Burkitt   FAMILY HISTORY Family History  Problem Relation Age of Onset   Diabetes Father    Diabetes Sister    Diabetes  Brother    SOCIAL HISTORY Social History   Tobacco Use   Smoking status: Never   Smokeless tobacco: Never  Vaping Use   Vaping status: Never Used  Substance Use Topics   Alcohol use: Never   Drug use: Never       OPHTHALMIC EXAM: Base Eye Exam     Visual Acuity (Snellen - Linear)       Right Left   Dist [place] 20/30 20/30         Tonometry (Tonopen, 3:08 PM)       Right Left   Pressure 22 22         Pupils       Dark Light Shape React APD   Right 3 2 Round Brisk None   Left 3 2 Round Brisk None         Visual Fields (Counting fingers)        Left Right    Full Full         Extraocular Movement       Right Left    Full, Ortho Full, Ortho         Neuro/Psych     Oriented x3: Yes   Mood/Affect: Normal         Dilation     Both eyes: 1.0% Mydriacyl, 2.5% Phenylephrine @ 3:10 PM           Slit Lamp and Fundus Exam     Slit Lamp Exam       Right Left   Lids/Lashes Dermatochalasis - upper lid Dermatochalasis - upper lid   Conjunctiva/Sclera White and quiet White and quiet   Cornea 2+ Punctate epithelial erosions, well healed temporal cataract wounds trace Punctate epithelial erosions, mild arcus, well healed temporal cataract wounds   Anterior Chamber deep, clear, narrow temporal angle Deep and quiet   Iris Round and dilated, No NVI Round and dilated, No NVI   Lens PC IOL in good position PC IOL in good position   Anterior Vitreous Vitreous syneresis Vitreous syneresis         Fundus Exam       Right Left   Disc mild Pallor, Sharp rim, no NVD Pink and Sharp   C/D Ratio 0.2 0.3   Macula good foveal reflex, scattered MA/DBH; interval increase in edema, no exudates good foveal reflex, scattered IRH, +cystic changes/edema, +CWS, no exudate   Vessels attenuated, Tortuous attenuated, Tortuous   Periphery Attached, scattered IRH/DBH/CWS greatest posteriorly Attached, scattered MA/DBH/CWS greatest posteriorly           IMAGING AND PROCEDURES  Imaging and Procedures for 11/05/2023  OCT, Retina - OU - Both Eyes       Right Eye Quality was good. Central Foveal Thickness: 319. Progression has worsened. Findings include no SRF, abnormal foveal contour, intraretinal hyper-reflective material, intraretinal fluid, outer retinal atrophy, vitreomacular adhesion (Interval redevelopment in IRF/edema greataest nasal macula).   Left Eye Quality was good. Central Foveal Thickness: 407. Progression has worsened. Findings include no SRF, abnormal foveal contour, intraretinal hyper-reflective material, intraretinal  fluid, vitreomacular adhesion (Interval redevelopment in IRF/IRHM nasal macula and fovea).   Notes *Images captured and stored on drive  Diagnosis / Impression:  DME OU OD: Interval redevelopment of IRF/edema greataest nasal macula OS: Interval redevelopment of IRF/IRHM nasal macula and fovea  Clinical management:  See below  Abbreviations: NFP - Normal foveal profile. CME - cystoid macular edema. PED - pigment epithelial detachment. IRF - intraretinal fluid. SRF -  subretinal fluid. EZ - ellipsoid zone. ERM - epiretinal membrane. ORA - outer retinal atrophy. ORT - outer retinal tubulation. SRHM - subretinal hyper-reflective material. IRHM - intraretinal hyper-reflective material      Intravitreal Injection, Pharmacologic Agent - OD - Right Eye       Time Out 11/05/2023. 3:47 PM. Confirmed correct patient, procedure, site, and patient consented.   Anesthesia Topical anesthesia was used. Anesthetic medications included Lidocaine 2%, Proparacaine 0.5%.   Procedure Preparation included 5% betadine to ocular surface, eyelid speculum. A supplied (32g) needle was used.   Injection: 1.25 mg Bevacizumab  1.25mg /0.91ml   Route: Intravitreal, Site: Right Eye   NDC: 49757-939-98, Lot: [id number], Expiration date: 11/17/2023   Post-op Post injection exam found visual acuity of at least counting fingers. The patient tolerated the procedure well. There were no complications. The patient received written and verbal post procedure care education. Post injection medications were not given.      Intravitreal Injection, Pharmacologic Agent - OS - Left Eye       Time Out 11/05/2023. 3:47 PM. Confirmed correct patient, procedure, site, and patient consented.   Anesthesia Topical anesthesia was used. Anesthetic medications included Lidocaine 2%, Proparacaine 0.5%.   Procedure Preparation included 5% betadine to ocular surface, eyelid speculum. A supplied (32g) needle was used.   Injection: 1.25  mg Bevacizumab  1.25mg /0.9ml   Route: Intravitreal, Site: Left Eye   NDC: C2662926, Lot: [id number], Expiration date: 12/06/2023   Post-op Post injection exam found visual acuity of at least counting fingers. The patient tolerated the procedure well. There were no complications. The patient received written and verbal post procedure care education. Post injection medications were not given.            ASSESSMENT/PLAN:    ICD-10-CM   1. 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 Current use of insulin  (HCC)  Z79.4     3. Long term (current) use of oral hypoglycemic drugs  Z79.84     4. Branch retinal vein occlusion of right eye with macular edema  H34.8310 OCT, Retina - OU - Both Eyes    5. Long-term (current) use of injectable non-insulin  antidiabetic drugs  Z79.85     6. Essential hypertension  I10     7. Hypertensive retinopathy of both eyes  H35.033     8. Pseudophakia, both eyes  Z96.1      1-4. Moderate non-proliferative diabetic retinopathy, OU (OD>OS)  - last A1c was 6.0 on 08.07.25, 8.8 on 04.15.24, 8.7 on 08.03.23 - s/p IVA OD #1 (12.31.21), #2 (02.04.22), #3 (03.04.22), #4 (04.01.22), #5 (05.05.22), #6 (07.15.22), #7 (11.09.22), #8 (12.21.22), #9 (02.01.23), #10 (03.01.23), #11 (04.05.23), #12 (05.10.23), #13 (06.14.23) - s/p IVA OS #1 (01.04.22),#2 (02.04.22), #3 (03.04.22), #4 (04.01.22),#5 (05.05.22), #6 (07.15.22), #7 (11.09.22), #8 (12.21.22), #9 (02.01.23), #10 (03.01.23), #11 (04.05.23), #12 (05.10.23)  **IVA resistance OU** - s/p IVE OS #1 (06.14.23), #2 (07.12.23), #3 (08.09.23), #4 (09.12.23), #5 (10.17.23), #6 (11.21.23), #7  (01.02.24), #8 (02.20.24), #9 (04.09.24), #10 (06.04.24), #11 (07.30.24) - s/p IVE OD #1 (07.12.23), #2 (08.09.23), #3 (09.12.23), #4 (10.17.23), #5 (12.21.23), #6 (01.02.24), #7 (02.20.24), #8 (04.09.24), #  9 (06.04.24), #10 (07.30.24) **Pt delayed from f/u 1+ year (07.30.24-08.13.25) due to illness/hospice** - exam shows scattered MA/DBH OU, central edema OU improving - BCVA 20/30 OD, 20/30 decreased from 20/25  - OCT shows OD: Interval redevelopment in IRF/edema greataest nasal macula; OS: Interval redevelopment in IRF/IRHM nasal macula and fovea at 1+ year - recommend IVA OD #14 and IVA OS #13 today, 08.13.25 w/ f/u in 4 wks -- [employer] funding unavailable - pt wishes to proceed w/ injections  - RBA of procedure discussed, questions answered  - Avastin  informed consent obtained and re-signed on 08.13.25 (OU)  - Eylea  informed consent obtained and signed on 06.16.23 (OU)  - Eylea  informed consent obtained and signed on 07.29.24 (OU) - see procedure note - f/u in 4 weeks, DFE, OCT, possible injections  5. BRVO w/ CME OD  - s/p IVA OD x13 and IVE as above - BCVA OD 20/30 - OCT OD shows Interval redevelopment in IRF/edema greataest nasal macula - recommend IVA OD today as above - f/u in 4 wks -- DFE/OCT/possible injection  6,7. Hypertensive retinopathy OU - discussed importance of tight BP control - monitor  8. Pseudophakia OU  - s/p CE/IOL OU (Dr. Cleatus)  - IOLs in good position, doing well - monitor  Ophthalmic Meds Ordered this visit:  Meds ordered this encounter  Medications   Bevacizumab  (AVASTIN ) SOLN 1.25 mg   Bevacizumab  (AVASTIN ) SOLN 1.25 mg     Return in about 4 weeks (around 12/03/2023) for NPDR OU, DFE, OCT, Possible Injxn.  There are no Patient Instructions on file for this visit.  Explained the diagnoses, plan, and follow up with the patient and they expressed understanding.  Patient expressed understanding of the importance of proper follow up care.   This  document serves as a record of services personally performed by Redell JUDITHANN Hans, MD, PhD. It was created on their behalf by Almetta Pesa, an ophthalmic technician. The creation of this record is the provider's dictation and/or activities during the visit.    Electronically signed by: Almetta Pesa, OA, 11/05/23  8:40 PM   Redell JUDITHANN Hans, M.D., Ph.D. Diseases & Surgery of the Retina and Vitreous [facility]  I have reviewed the above documentation for accuracy and completeness, and I agree with the above. Redell JUDITHANN Hans, M.D., Ph.D. 11/05/23 8:40 PM    Abbreviations: M myopia (nearsighted); A astigmatism; H hyperopia (farsighted); P presbyopia; Mrx spectacle prescription;  CTL contact lenses; OD right eye; OS left eye; OU both eyes  XT exotropia; ET esotropia; PEK punctate epithelial keratitis; PEE punctate epithelial erosions; DES dry eye syndrome; MGD meibomian gland dysfunction; ATs artificial tears; PFAT's preservative free artificial tears; NSC nuclear sclerotic cataract; PSC posterior subcapsular cataract; ERM epi-retinal membrane; PVD posterior vitreous detachment; RD retinal detachment; DM diabetes mellitus; DR diabetic retinopathy; NPDR non-proliferative diabetic retinopathy; PDR proliferative diabetic retinopathy; CSME clinically significant macular edema; DME diabetic macular edema; dbh dot blot hemorrhages; CWS cotton wool spot; POAG primary open angle glaucoma; C/D cup-to-disc ratio; HVF humphrey visual field; GVF goldmann visual field; OCT optical coherence tomography; IOP intraocular pressure; BRVO Branch retinal vein occlusion; CRVO central retinal vein occlusion; CRAO central retinal artery occlusion; BRAO branch retinal artery occlusion; RT retinal tear; SB scleral buckle; PPV pars plana vitrectomy; VH Vitreous hemorrhage; PRP panretinal laser photocoagulation; IVK intravitreal kenalog; VMT vitreomacular traction; MH Macular hole;  NVD neovascularization  of the disc; NVE neovascularization elsewhere; AREDS age related eye disease study; ARMD  age related macular degeneration; POAG primary open angle glaucoma; EBMD epithelial/anterior basement membrane dystrophy; ACIOL anterior chamber intraocular lens; IOL intraocular lens; PCIOL posterior chamber intraocular lens; Phaco/IOL phacoemulsification with intraocular lens placement; PRK photorefractive keratectomy; LASIK laser assisted in situ keratomileusis; HTN hypertension; DM diabetes mellitus; COPD chronic obstructive pulmonary disease

## 2023-11-05 ENCOUNTER — Encounter (INDEPENDENT_AMBULATORY_CARE_PROVIDER_SITE_OTHER): Payer: Self-pay | Admitting: Ophthalmology

## 2023-11-05 ENCOUNTER — Ambulatory Visit (INDEPENDENT_AMBULATORY_CARE_PROVIDER_SITE_OTHER): Admitting: Ophthalmology

## 2023-11-05 DIAGNOSIS — E113313 Type 2 diabetes mellitus with moderate nonproliferative diabetic retinopathy with macular edema, bilateral: Secondary | ICD-10-CM

## 2023-11-05 DIAGNOSIS — H34831 Tributary (branch) retinal vein occlusion, right eye, with macular edema: Secondary | ICD-10-CM | POA: Diagnosis not present

## 2023-11-05 DIAGNOSIS — I10 Essential (primary) hypertension: Secondary | ICD-10-CM

## 2023-11-05 DIAGNOSIS — Z794 Long term (current) use of insulin: Secondary | ICD-10-CM | POA: Diagnosis not present

## 2023-11-05 DIAGNOSIS — Z7984 Long term (current) use of oral hypoglycemic drugs: Secondary | ICD-10-CM

## 2023-11-05 DIAGNOSIS — H35033 Hypertensive retinopathy, bilateral: Secondary | ICD-10-CM

## 2023-11-05 DIAGNOSIS — Z961 Presence of intraocular lens: Secondary | ICD-10-CM

## 2023-11-05 DIAGNOSIS — Z7985 Long-term (current) use of injectable non-insulin antidiabetic drugs: Secondary | ICD-10-CM

## 2023-11-05 MED ORDER — BEVACIZUMAB CHEMO INJECTION 1.25MG/0.05ML SYRINGE FOR KALEIDOSCOPE
1.2500 mg | INTRAVITREAL | Status: AC | PRN
Start: 2023-11-05 — End: 2023-11-05
  Administered 2023-11-05 (×2): 1.25 mg via INTRAVITREAL

## 2023-12-03 NOTE — Progress Notes (Signed)
 [facility] - Clinic Note  12/05/2023     CHIEF COMPLAINT Patient presents for Retina Follow Up   HISTORY OF PRESENT ILLNESS: Thomas Finley is a 68 y.o. male who presents to the clinic today for:   HPI     Retina Follow Up   Patient presents with  Diabetic Retinopathy.  In both eyes.  This started 4 weeks ago.  Duration of 4 weeks.  Since onset it is stable.  I, the attending physician,  performed the HPI with the patient and updated documentation appropriately.        Comments   4 week retina follow up NPDR and IVA OU pt is reporting no vision changes noticed last reading 121       Last edited by Valdemar Rogue, MD on 12/05/2023  3:26 PM.     Pt states he's doing well, no VA changes. Nothing new health wise  Referring physician: No referring provider defined for this encounter.  HISTORICAL INFORMATION:   Selected notes from the MEDICAL RECORD NUMBER Referred by Dr. Lamarr Burkitt for concern of CRVO   CURRENT MEDICATIONS: No current outpatient medications on file. (Ophthalmic Drugs)   No current facility-administered medications for this visit. (Ophthalmic Drugs)   Current Outpatient Medications (Other)  Medication Sig   atorvastatin  (LIPITOR) 10 MG tablet Take 10 mg by mouth.   buPROPion  (WELLBUTRIN  XL) 300 MG 24 hr tablet Take 300 mg by mouth daily.   FLUoxetine (PROZAC) 40 MG capsule Take 40 mg by mouth daily.   insulin  glargine, 1 Unit Dial, (TOUJEO SOLOSTAR) 300 UNIT/ML Solostar Pen Inject into the skin.   insulin  glulisine (APIDRA) 100 UNIT/ML injection Inject 100 Units into the skin 3 (three) times daily before meals.   lisinopril (ZESTRIL) 2.5 MG tablet Take 2.5 mg by mouth daily.   LORazepam  (ATIVAN ) 1 MG tablet Take 1 tablet (1 mg total) by mouth every 8 (eight) hours as needed for anxiety.   ondansetron  (ZOFRAN -ODT) 4 MG disintegrating tablet Take 1 tablet (4 mg total) by mouth every 8 (eight) hours as needed for nausea or vomiting.    OXcarbazepine (TRILEPTAL) 150 MG tablet Take 150 mg by mouth 2 (two) times daily.   pantoprazole  (PROTONIX ) 40 MG tablet Take 40 mg by mouth daily.   Prucalopride Succinate 2 MG TABS Take by mouth.   valbenazine (INGREZZA) 40 MG capsule Take 40 mg by mouth daily.   No current facility-administered medications for this visit. (Other)   REVIEW OF SYSTEMS: ROS   Positive for: Neurological, Genitourinary, Endocrine, Cardiovascular, Eyes Negative for: Constitutional, Gastrointestinal, Skin, Musculoskeletal, HENT, Respiratory, Psychiatric, Allergic/Imm, Heme/Lymph Last edited by Resa Delon ORN, COT on 12/05/2023  1:47 PM.      ALLERGIES Allergies  Allergen Reactions   Insulin  Glargine-Lixisenatide Nausea And Vomiting   PAST MEDICAL HISTORY Past Medical History:  Diagnosis Date   Diabetes (HCC)    Diabetic retinopathy (HCC)    NPDR OU   Hypertension    Hypertensive retinopathy    OU   Past Surgical History:  Procedure Laterality Date   CATARACT EXTRACTION Bilateral    Dr. Burkitt   EYE SURGERY Bilateral    Cat Sx - Dr. Burkitt   FAMILY HISTORY Family History  Problem Relation Age of Onset   Diabetes Father    Diabetes Sister    Diabetes Brother    SOCIAL HISTORY Social History   Tobacco Use   Smoking status: Never   Smokeless tobacco: Never  Vaping  Use   Vaping status: Never Used  Substance Use Topics   Alcohol use: Never   Drug use: Never       OPHTHALMIC EXAM: Base Eye Exam     Visual Acuity (Snellen - Linear)       Right Left   Dist [employer] 20/40 -2 20/40 -2   Dist ph [place] NI NI         Tonometry (Tonopen, 1:53 PM)       Right Left   Pressure 17 18         Pupils       Pupils Dark Light Shape React APD   Right PERRL 3 2 Round Brisk None   Left PERRL 3 2 Round Brisk None         Visual Fields       Left Right    Full Full         Extraocular Movement       Right Left    Full, Ortho Full, Ortho         Neuro/Psych      Oriented x3: Yes   Mood/Affect: Normal         Dilation     Both eyes: 2.5% Phenylephrine @ 1:53 PM           Slit Lamp and Fundus Exam     Slit Lamp Exam       Right Left   Lids/Lashes Dermatochalasis - upper lid Dermatochalasis - upper lid   Conjunctiva/Sclera White and quiet White and quiet   Cornea 2+ Punctate epithelial erosions, well healed temporal cataract wounds trace Punctate epithelial erosions, mild arcus, well healed temporal cataract wounds   Anterior Chamber deep, clear, narrow temporal angle Deep and quiet   Iris Round and dilated, No NVI Round and dilated, No NVI   Lens PC IOL in good position PC IOL in good position   Anterior Vitreous Vitreous syneresis Vitreous syneresis         Fundus Exam       Right Left   Disc mild Pallor, Sharp rim, no NVD Pink and Sharp   C/D Ratio 0.2 0.3   Macula good foveal reflex, scattered MA/DBH; mild interval improvement in edema, no exudates, scattered CWS good foveal reflex, scattered IRH, +cystic changes/edema--slightly increased, +CWS, no exudates   Vessels attenuated, Tortuous attenuated, Tortuous   Periphery Attached, scattered IRH/DBH/CWS greatest posteriorly Attached, scattered MA/DBH/CWS greatest posteriorly           IMAGING AND PROCEDURES  Imaging and Procedures for 12/05/2023  OCT, Retina - OU - Both Eyes       Right Eye Quality was good. Central Foveal Thickness: 283. Progression has improved. Findings include no SRF, abnormal foveal contour, intraretinal hyper-reflective material, intraretinal fluid, outer retinal atrophy, vitreomacular adhesion (Interval improvement in IRF/edema greataest nasal and superior macula).   Left Eye Quality was good. Central Foveal Thickness: 445. Progression has worsened. Findings include no SRF, abnormal foveal contour, intraretinal hyper-reflective material, intraretinal fluid, vitreomacular adhesion (Persistent IRF/IRHM nasal macula and fovea--slightly increased).    Notes *Images captured and stored on drive  Diagnosis / Impression:  DME OU OD: Interval improvement of IRF/edema greataest nasal and superior macula OS: Persistent IRF/IRHM nasal macula and fovea--slightly increased  Clinical management:  See below  Abbreviations: NFP - Normal foveal profile. CME - cystoid macular edema. PED - pigment epithelial detachment. IRF - intraretinal fluid. SRF - subretinal fluid. EZ - ellipsoid zone. ERM - epiretinal membrane.  ORA - outer retinal atrophy. ORT - outer retinal tubulation. SRHM - subretinal hyper-reflective material. IRHM - intraretinal hyper-reflective material      Intravitreal Injection, Pharmacologic Agent - OD - Right Eye       Time Out 12/05/2023. 2:28 PM. Confirmed correct patient, procedure, site, and patient consented.   Anesthesia Topical anesthesia was used. Anesthetic medications included Lidocaine 2%, Proparacaine 0.5%.   Procedure Preparation included 5% betadine to ocular surface, eyelid speculum. A (32g) needle was used.   Injection: 1.25 mg Bevacizumab  1.25mg /0.77ml   Route: Intravitreal, Site: Right Eye   NDC: H525437, Lot: [id number], Expiration date: 02/19/2024   Post-op Post injection exam found visual acuity of at least counting fingers. The patient tolerated the procedure well. There were no complications. The patient received written and verbal post procedure care education. Post injection medications were not given.      Intravitreal Injection, Pharmacologic Agent - OS - Left Eye       Time Out 12/05/2023. 2:29 PM. Confirmed correct patient, procedure, site, and patient consented.   Anesthesia Topical anesthesia was used. Anesthetic medications included Lidocaine 2%, Proparacaine 0.5%.   Procedure Preparation included 5% betadine to ocular surface, eyelid speculum. A supplied (32g) needle was used.   Injection: 1.25 mg Bevacizumab  1.25mg /0.68ml   Route: Intravitreal, Site: Left Eye   NDC:  H525437, Lot: [id number], Expiration date: 12/14/2023   Post-op Post injection exam found visual acuity of at least counting fingers. The patient tolerated the procedure well. There were no complications. The patient received written and verbal post procedure care education. Post injection medications were not given.             ASSESSMENT/PLAN:    ICD-10-CM   1. 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 Current use of insulin  (HCC)  Z79.4     3. Long term (current) use of oral hypoglycemic drugs  Z79.84     4. Branch retinal vein occlusion of right eye with macular edema  H34.8310     5. Long-term (current) use of injectable non-insulin  antidiabetic drugs  Z79.85     6. Essential hypertension  I10     7. Hypertensive retinopathy of both eyes  H35.033     8. Pseudophakia, both eyes  Z96.1       1-4. Moderate non-proliferative diabetic retinopathy, OU (OD>OS)  - last A1c was 6.0 on 08.07.25, 8.8 on 04.15.24, 8.7 on 08.03.23 - s/p IVA OD #1 (12.31.21), #2 (02.04.22), #3 (03.04.22), #4 (04.01.22), #5 (05.05.22), #6 (07.15.22), #7 (11.09.22), #8 (12.21.22), #9 (02.01.23), #10 (03.01.23), #11 (04.05.23), #12 (05.10.23), #13 (06.14.23) #14(08.13.2025) - s/p IVA OS #1 (01.04.22),#2 (02.04.22), #3 (03.04.22), #4 (04.01.22),#5 (05.05.22), #6 (07.15.22), #7 (11.09.22), #8 (12.21.22), #9 (02.01.23), #10 (03.01.23), #11 (04.05.23), #12 (05.10.23) #13(08.13.2025)  **IVA resistance OU** - s/p IVE OS #1 (06.14.23), #2 (07.12.23), #3 (08.09.23), #4 (09.12.23), #5 (10.17.23), #6 (11.21.23), #7 (01.02.24), #8 (02.20.24), #9 (04.09.24), #10 (06.04.24), #11  (07.30.24) - s/p IVE OD #1 (07.12.23), #2 (08.09.23), #3 (09.12.23), #4 (10.17.23), #5 (12.21.23), #6 (01.02.24), #7 (02.20.24), #8 (04.09.24), #9 (06.04.24), #10 (07.30.24) **Pt delayed from f/u 1+ year (07.30.24-08.13.25) due to  illness/hospice** - exam shows scattered MA/DBH OU, central edema OU improving - BCVA 20/40 decreased from 20/30 OD, 20/40 decreased from 20/30  - OCT shows OD: Interval improvement in IRF/edema greataest nasal and superior macula; OS: Persistent IRF/IRHM nasal macula and fovea--slightly increased at 4wks - recommend IVA OD #15 and IVA OS #14 today, 09.15.25 w/ f/u in 4 wks -- [employer] funding unavailable - pt wishes to proceed w/ injections  - RBA of procedure discussed, questions answered  - Avastin  informed consent obtained and re-signed on 08.13.25 (OU)  - Eylea  informed consent obtained and signed on 07.29.24 (OU) - see procedure note - f/u in 4 weeks, DFE, OCT, possible injections  5. BRVO w/ CME OD  - s/p IVA OD x14 and IVE as above - BCVA OD 20/40 - OCT OD shows Interval improvement in IRF/edema greataest nasal macula - recommend IVA OD today as above - f/u in 4 wks -- DFE/OCT/possible injection  6,7. Hypertensive retinopathy OU - discussed importance of tight BP control - monitor  8. Pseudophakia OU  - s/p CE/IOL OU (Dr. Cleatus)  - IOLs in good position, doing well - monitor  Ophthalmic Meds Ordered this visit:  Meds ordered this encounter  Medications   Bevacizumab  (AVASTIN ) SOLN 1.25 mg   Bevacizumab  (AVASTIN ) SOLN 1.25 mg     Return in about 4 weeks (around 01/02/2024) for NPDR OU , DFE, OCT, Possible Injxn.  There are no Patient Instructions on file for this visit.  This document serves as a record of services personally performed by Redell JUDITHANN Hans, MD, PhD. It was created on their behalf by Almetta Pesa, an ophthalmic technician. The creation of this record is the provider's dictation and/or activities during the visit.     Electronically signed by: Almetta Pesa, OA, 12/12/23  3:50 AM  Redell JUDITHANN Hans, M.D., Ph.D. Diseases & Surgery of the Retina and Vitreous [facility] 12/05/2023   I have reviewed the above documentation for accuracy and completeness, and I agree with the above. Redell JUDITHANN Hans, M.D., Ph.D. 12/12/23 3:52 AM    Abbreviations: M myopia (nearsighted); A astigmatism; H hyperopia (farsighted); P presbyopia; Mrx spectacle prescription;  CTL contact lenses; OD right eye; OS left eye; OU both eyes  XT exotropia; ET esotropia; PEK punctate epithelial keratitis; PEE punctate epithelial erosions; DES dry eye syndrome; MGD meibomian gland dysfunction; ATs artificial tears; PFAT's preservative free artificial tears; NSC nuclear sclerotic cataract; PSC posterior subcapsular cataract; ERM epi-retinal membrane; PVD posterior vitreous detachment; RD retinal detachment; DM diabetes mellitus; DR diabetic retinopathy; NPDR non-proliferative diabetic retinopathy; PDR proliferative diabetic retinopathy; CSME clinically significant macular edema; DME diabetic macular edema; dbh dot blot hemorrhages; CWS cotton wool spot; POAG primary open angle glaucoma; C/D cup-to-disc ratio; HVF humphrey visual field; GVF goldmann visual field; OCT optical coherence tomography; IOP intraocular pressure; BRVO Branch retinal vein occlusion; CRVO central retinal vein occlusion; CRAO central retinal artery occlusion; BRAO branch retinal artery occlusion; RT retinal tear; SB scleral buckle; PPV pars plana vitrectomy; VH Vitreous hemorrhage; PRP panretinal laser photocoagulation; IVK intravitreal kenalog; VMT vitreomacular traction; MH Macular hole;  NVD neovascularization of the disc; NVE neovascularization elsewhere; AREDS age related eye disease study; ARMD age related macular degeneration; POAG primary open angle glaucoma; EBMD epithelial/anterior basement membrane dystrophy; ACIOL anterior chamber intraocular  lens; IOL intraocular lens; PCIOL posterior chamber intraocular lens; Phaco/IOL phacoemulsification with intraocular lens placement; PRK photorefractive keratectomy; LASIK laser assisted in situ keratomileusis; HTN hypertension; DM diabetes mellitus;  COPD chronic obstructive pulmonary disease

## 2023-12-05 ENCOUNTER — Ambulatory Visit (INDEPENDENT_AMBULATORY_CARE_PROVIDER_SITE_OTHER): Admitting: Ophthalmology

## 2023-12-05 ENCOUNTER — Encounter (INDEPENDENT_AMBULATORY_CARE_PROVIDER_SITE_OTHER): Payer: Self-pay | Admitting: Ophthalmology

## 2023-12-05 DIAGNOSIS — E113313 Type 2 diabetes mellitus with moderate nonproliferative diabetic retinopathy with macular edema, bilateral: Secondary | ICD-10-CM

## 2023-12-05 DIAGNOSIS — H34831 Tributary (branch) retinal vein occlusion, right eye, with macular edema: Secondary | ICD-10-CM

## 2023-12-05 DIAGNOSIS — Z961 Presence of intraocular lens: Secondary | ICD-10-CM

## 2023-12-05 DIAGNOSIS — Z7984 Long term (current) use of oral hypoglycemic drugs: Secondary | ICD-10-CM | POA: Diagnosis not present

## 2023-12-05 DIAGNOSIS — Z7985 Long-term (current) use of injectable non-insulin antidiabetic drugs: Secondary | ICD-10-CM

## 2023-12-05 DIAGNOSIS — Z794 Long term (current) use of insulin: Secondary | ICD-10-CM | POA: Diagnosis not present

## 2023-12-05 DIAGNOSIS — I10 Essential (primary) hypertension: Secondary | ICD-10-CM

## 2023-12-05 DIAGNOSIS — H35033 Hypertensive retinopathy, bilateral: Secondary | ICD-10-CM

## 2023-12-05 MED ORDER — BEVACIZUMAB CHEMO INJECTION 1.25MG/0.05ML SYRINGE FOR KALEIDOSCOPE
1.2500 mg | INTRAVITREAL | Status: AC | PRN
Start: 2023-12-05 — End: 2023-12-05
  Administered 2023-12-05: 1.25 mg via INTRAVITREAL

## 2023-12-29 NOTE — Progress Notes (Shared)
 [facility] - Clinic Note  01/02/2024     CHIEF COMPLAINT Patient presents for No chief complaint on file.   HISTORY OF PRESENT ILLNESS: Thomas Finley is a 68 y.o. male who presents to the clinic today for:     Pt states he's doing well, no VA changes. Nothing new health wise  Referring physician: No referring provider defined for this encounter.  HISTORICAL INFORMATION:   Selected notes from the MEDICAL RECORD NUMBER Referred by Dr. Lamarr Burkitt for concern of CRVO   CURRENT MEDICATIONS: No current outpatient medications on file. (Ophthalmic Drugs)   No current facility-administered medications for this visit. (Ophthalmic Drugs)   Current Outpatient Medications (Other)  Medication Sig   atorvastatin  (LIPITOR) 10 MG tablet Take 10 mg by mouth.   buPROPion  (WELLBUTRIN  XL) 300 MG 24 hr tablet Take 300 mg by mouth daily.   FLUoxetine (PROZAC) 40 MG capsule Take 40 mg by mouth daily.   insulin  glargine, 1 Unit Dial, (TOUJEO SOLOSTAR) 300 UNIT/ML Solostar Pen Inject into the skin.   insulin  glulisine (APIDRA) 100 UNIT/ML injection Inject 100 Units into the skin 3 (three) times daily before meals.   lisinopril (ZESTRIL) 2.5 MG tablet Take 2.5 mg by mouth daily.   LORazepam  (ATIVAN ) 1 MG tablet Take 1 tablet (1 mg total) by mouth every 8 (eight) hours as needed for anxiety.   ondansetron  (ZOFRAN -ODT) 4 MG disintegrating tablet Take 1 tablet (4 mg total) by mouth every 8 (eight) hours as needed for nausea or vomiting.   OXcarbazepine (TRILEPTAL) 150 MG tablet Take 150 mg by mouth 2 (two) times daily.   pantoprazole  (PROTONIX ) 40 MG tablet Take 40 mg by mouth daily.   Prucalopride Succinate 2 MG TABS Take by mouth.   valbenazine (INGREZZA) 40 MG capsule Take 40 mg by mouth daily.   No current facility-administered medications for this visit. (Other)   REVIEW OF SYSTEMS:    ALLERGIES Allergies  Allergen Reactions   Insulin  Glargine-Lixisenatide Nausea  And Vomiting   PAST MEDICAL HISTORY Past Medical History:  Diagnosis Date   Diabetes (HCC)    Diabetic retinopathy (HCC)    NPDR OU   Hypertension    Hypertensive retinopathy    OU   Past Surgical History:  Procedure Laterality Date   CATARACT EXTRACTION Bilateral    Dr. Burkitt   EYE SURGERY Bilateral    Cat Sx - Dr. Burkitt   FAMILY HISTORY Family History  Problem Relation Age of Onset   Diabetes Father    Diabetes Sister    Diabetes Brother    SOCIAL HISTORY Social History   Tobacco Use   Smoking status: Never   Smokeless tobacco: Never  Vaping Use   Vaping status: Never Used  Substance Use Topics   Alcohol use: Never   Drug use: Never       OPHTHALMIC EXAM: Not recorded    IMAGING AND PROCEDURES  Imaging and Procedures for 01/02/2024           ASSESSMENT/PLAN:  No diagnosis found.   1-4. Moderate non-proliferative diabetic retinopathy, OU (OD>OS)  - last A1c was 6.0 on 08.07.25, 8.8 on 04.15.24, 8.7 on 08.03.23 - s/p IVA OD #1 (12.31.21), #2 (02.04.22), #3 (03.04.22), #4 (04.01.22), #5 (05.05.22), #6 (07.15.22), #7 (11.09.22), #8 (12.21.22), #9 (02.01.23), #10 (03.01.23), #11 (04.05.23), #12 (05.10.23), #13 (06.14.23) #14(08.13.2025) #15(09.15.25) - s/p IVA OS #1 (01.04.22),#2 (02.04.22), #3 (03.04.22), #4 (04.01.22),#5 (05.05.22), #6 (07.15.22), #7 (11.09.22), #8 (12.21.22), #9 (02.01.23), #10 (  03.01.23), #11 (04.05.23), #12 (05.10.23) #13(08.13.2025) #14(09.15.25)  **IVA resistance OU** - s/p IVE OS #1 (06.14.23), #2 (07.12.23), #3 (08.09.23), #4 (09.12.23), #5 (10.17.23), #6 (11.21.23), #7 (01.02.24), #8 (02.20.24), #9 (04.09.24), #10 (06.04.24), #11 (07.30.24) - s/p IVE OD #1 (07.12.23), #2 (08.09.23), #3 (09.12.23), #4 (10.17.23), #5 (12.21.23), #6 (01.02.24), #7 (02.20.24), #8 (04.09.24), #9 (06.04.24), #10 (07.30.24) **Pt delayed from f/u 1+ year (07.30.24-08.13.25) due to illness/hospice** - exam shows scattered MA/DBH OU, central edema OU  improving - BCVA 20/40 decreased from 20/30 OD, 20/40 decreased from 20/30  - OCT shows OD: Interval improvement in IRF/edema greataest nasal and superior macula; OS: Persistent IRF/IRHM nasal macula and fovea--slightly increased at 4wks - recommend IVA OD #16 and IVA OS #15 today, 10.10.25 w/ f/u in 4 wks -- [employer] funding unavailable - pt wishes to proceed w/ injections  - RBA of procedure discussed, questions answered  - Avastin  informed consent obtained and re-signed on 08.13.25 (OU)  - Eylea  informed consent obtained and signed on 07.29.24 (OU) - see procedure note - f/u in 4 weeks, DFE, OCT, possible injections  5. BRVO w/ CME OD  - s/p IVA OD x14 and IVE as above - BCVA OD 20/40 - OCT OD shows Interval improvement in IRF/edema greataest nasal macula - recommend IVA OD today as above - f/u in 4 wks -- DFE/OCT/possible injection  6,7. Hypertensive retinopathy OU - discussed importance of tight BP control - monitor  8. Pseudophakia OU  - s/p CE/IOL OU (Dr. Cleatus)  - IOLs in good position, doing well - monitor  Ophthalmic Meds Ordered this visit:  No orders of the defined types were placed in this encounter.    No follow-ups on file.  There are no Patient Instructions on file for this visit. This document serves as a record of services personally performed by Redell JUDITHANN Hans, MD, PhD. It was created on their behalf by Delon Newness COT, an ophthalmic technician. The creation of this record is the provider's dictation and/or activities during the visit.    Electronically signed by: Delon Newness COT 10.06.25 10:55 AM    Abbreviations: M myopia (nearsighted); A astigmatism; H hyperopia (farsighted); P presbyopia; Mrx spectacle prescription;  CTL contact lenses; OD right eye; OS left eye; OU both eyes  XT exotropia; ET esotropia; PEK punctate epithelial keratitis; PEE punctate epithelial erosions; DES dry eye syndrome; MGD meibomian gland dysfunction; ATs  artificial tears; PFAT's preservative free artificial tears; NSC nuclear sclerotic cataract; PSC posterior subcapsular cataract; ERM epi-retinal membrane; PVD posterior vitreous detachment; RD retinal detachment; DM diabetes mellitus; DR diabetic retinopathy; NPDR non-proliferative diabetic retinopathy; PDR proliferative diabetic retinopathy; CSME clinically significant macular edema; DME diabetic macular edema; dbh dot blot hemorrhages; CWS cotton wool spot; POAG primary open angle glaucoma; C/D cup-to-disc ratio; HVF humphrey visual field; GVF goldmann visual field; OCT optical coherence tomography; IOP intraocular pressure; BRVO Branch retinal vein occlusion; CRVO central retinal vein occlusion; CRAO central retinal artery occlusion; BRAO branch retinal artery occlusion; RT retinal tear; SB scleral buckle; PPV pars plana vitrectomy; VH Vitreous hemorrhage; PRP panretinal laser photocoagulation; IVK intravitreal kenalog; VMT vitreomacular traction; MH Macular hole;  NVD neovascularization of the disc; NVE neovascularization elsewhere; AREDS age related eye disease study; ARMD age related macular degeneration; POAG primary open angle glaucoma; EBMD epithelial/anterior basement membrane dystrophy; ACIOL anterior chamber intraocular lens; IOL intraocular lens; PCIOL posterior chamber intraocular lens; Phaco/IOL phacoemulsification with intraocular lens placement; PRK photorefractive keratectomy; LASIK laser assisted in situ keratomileusis; HTN hypertension; DM diabetes  mellitus; COPD chronic obstructive pulmonary disease

## 2024-01-02 ENCOUNTER — Encounter (INDEPENDENT_AMBULATORY_CARE_PROVIDER_SITE_OTHER): Admitting: Ophthalmology

## 2024-01-02 DIAGNOSIS — Z961 Presence of intraocular lens: Secondary | ICD-10-CM

## 2024-01-02 DIAGNOSIS — Z794 Long term (current) use of insulin: Secondary | ICD-10-CM

## 2024-01-02 DIAGNOSIS — H35033 Hypertensive retinopathy, bilateral: Secondary | ICD-10-CM

## 2024-01-02 DIAGNOSIS — I10 Essential (primary) hypertension: Secondary | ICD-10-CM

## 2024-01-02 DIAGNOSIS — H34831 Tributary (branch) retinal vein occlusion, right eye, with macular edema: Secondary | ICD-10-CM

## 2024-01-02 DIAGNOSIS — E113313 Type 2 diabetes mellitus with moderate nonproliferative diabetic retinopathy with macular edema, bilateral: Secondary | ICD-10-CM

## 2024-01-02 DIAGNOSIS — Z7984 Long term (current) use of oral hypoglycemic drugs: Secondary | ICD-10-CM

## 2024-01-02 DIAGNOSIS — Z7985 Long-term (current) use of injectable non-insulin antidiabetic drugs: Secondary | ICD-10-CM

## 2024-01-12 NOTE — Progress Notes (Shared)
 [facility] - Clinic Note  01/14/2024     CHIEF COMPLAINT Patient presents for Retina Follow Up   HISTORY OF PRESENT ILLNESS: Thomas Finley is a 68 y.o. male who presents to the clinic today for:   HPI     Retina Follow Up   Patient presents with  Diabetic Retinopathy.  In both eyes.  This started 4 years ago.  Duration of 5.5 weeks.  Since onset it is stable.  I, the attending physician,  performed the HPI with the patient and updated documentation appropriately.        Comments   Pt states no concerns with vision. Pt denies FOL/floaters/pain. Pt is not using ATS. Pt recently left long term care facility. A1c=6.0 10/30/23       Last edited by Valdemar Rogue, MD on 01/15/2024 12:08 AM.     Pt states he was unable to come to his appt due to transportation issues.   Referring physician: No referring provider defined for this encounter.  HISTORICAL INFORMATION:   Selected notes from the MEDICAL RECORD NUMBER Referred by Dr. Lamarr Burkitt for concern of CRVO   CURRENT MEDICATIONS: No current outpatient medications on file. (Ophthalmic Drugs)   No current facility-administered medications for this visit. (Ophthalmic Drugs)   Current Outpatient Medications (Other)  Medication Sig   atorvastatin  (LIPITOR) 10 MG tablet Take 10 mg by mouth.   buPROPion  (WELLBUTRIN  XL) 300 MG 24 hr tablet Take 300 mg by mouth daily.   FLUoxetine (PROZAC) 40 MG capsule Take 40 mg by mouth daily.   insulin  glargine, 1 Unit Dial, (TOUJEO SOLOSTAR) 300 UNIT/ML Solostar Pen Inject into the skin.   insulin  glulisine (APIDRA) 100 UNIT/ML injection Inject 100 Units into the skin 3 (three) times daily before meals.   lisinopril (ZESTRIL) 2.5 MG tablet Take 2.5 mg by mouth daily.   LORazepam  (ATIVAN ) 1 MG tablet Take 1 tablet (1 mg total) by mouth every 8 (eight) hours as needed for anxiety.   ondansetron  (ZOFRAN -ODT) 4 MG disintegrating tablet Take 1 tablet (4 mg total) by mouth  every 8 (eight) hours as needed for nausea or vomiting.   OXcarbazepine (TRILEPTAL) 150 MG tablet Take 150 mg by mouth 2 (two) times daily.   pantoprazole  (PROTONIX ) 40 MG tablet Take 40 mg by mouth daily.   Prucalopride Succinate 2 MG TABS Take by mouth.   valbenazine (INGREZZA) 40 MG capsule Take 40 mg by mouth daily.   No current facility-administered medications for this visit. (Other)   REVIEW OF SYSTEMS: ROS   Positive for: Neurological, Genitourinary, Endocrine, Cardiovascular, Eyes Negative for: Constitutional, Gastrointestinal, Skin, Musculoskeletal, HENT, Respiratory, Psychiatric, Allergic/Imm, Heme/Lymph Last edited by Elnor Avelina RAMAN, COT on 01/14/2024  2:55 PM.       ALLERGIES Allergies  Allergen Reactions   Insulin  Glargine-Lixisenatide Nausea And Vomiting   PAST MEDICAL HISTORY Past Medical History:  Diagnosis Date   Diabetes (HCC)    Diabetic retinopathy (HCC)    NPDR OU   Hypertension    Hypertensive retinopathy    OU   Past Surgical History:  Procedure Laterality Date   CATARACT EXTRACTION Bilateral    Dr. Burkitt   EYE SURGERY Bilateral    Cat Sx - Dr. Burkitt   FAMILY HISTORY Family History  Problem Relation Age of Onset   Diabetes Father    Diabetes Sister    Diabetes Brother    SOCIAL HISTORY Social History   Tobacco Use   Smoking status: Never  Smokeless tobacco: Never  Vaping Use   Vaping status: Never Used  Substance Use Topics   Alcohol use: Never   Drug use: Never       OPHTHALMIC EXAM: Base Eye Exam     Visual Acuity (Snellen - Linear)       Right Left   Dist [place] 40-2 20/50 +2   Dist ph  NI NI         Tonometry (Tonopen, 3:03 PM)       Right Left   Pressure 17 15         Pupils       Pupils Dark Light Shape React APD   Right PERRL 2 1 Round Minimal None   Left PERRL 2 1 Round Minimal None         Visual Fields       Left Right    Full Full         Extraocular Movement       Right Left     Full, Ortho Full, Ortho         Neuro/Psych     Oriented x3: Yes   Mood/Affect: Normal         Dilation     Both eyes: 1.0% Mydriacyl, 2.5% Phenylephrine @ 3:04 PM           Slit Lamp and Fundus Exam     Slit Lamp Exam       Right Left   Lids/Lashes Dermatochalasis - upper lid Dermatochalasis - upper lid   Conjunctiva/Sclera White and quiet White and quiet   Cornea 2+ Punctate epithelial erosions, well healed temporal cataract wounds trace Punctate epithelial erosions, mild arcus, well healed temporal cataract wounds   Anterior Chamber deep, clear, narrow temporal angle Deep and quiet   Iris Round and dilated, No NVI Round and dilated, No NVI   Lens PC IOL in good position PC IOL in good position   Anterior Vitreous Vitreous syneresis Vitreous syneresis         Fundus Exam       Right Left   Disc mild Pallor, Sharp rim, no NVD Pink and Sharp   C/D Ratio 0.2 0.3   Macula good foveal reflex, scattered MA/DBH; mild interval increase in edema, no exudates, scattered CWS good foveal reflex, scattered IRH, +cystic changes/edema--slightly increased, +CWS, no exudates   Vessels attenuated, Tortuous attenuated, Tortuous   Periphery Attached, scattered IRH/DBH/CWS greatest posteriorly Attached, scattered MA/DBH/CWS greatest posteriorly           IMAGING AND PROCEDURES  Imaging and Procedures for 01/14/2024  OCT, Retina - OU - Both Eyes       Right Eye Quality was good. Central Foveal Thickness: 342. Progression has worsened. Findings include no SRF, abnormal foveal contour, intraretinal hyper-reflective material, intraretinal fluid, outer retinal atrophy, vitreomacular adhesion (Interval increase in IRF/edema greataest superior fovea and macula).   Left Eye Quality was good. Central Foveal Thickness: 589. Progression has worsened. Findings include no SRF, abnormal foveal contour, intraretinal hyper-reflective material, intraretinal fluid, vitreomacular adhesion  (Interval increase in IRF/IRHM nasal macula and fovea).   Notes *Images captured and stored on drive  Diagnosis / Impression:  DME OU OD: Interval increase in IRF/edema greatest superior fovea and macula OS: Interval increase in IRF/IRHM nasal macula and fovea  Clinical management:  See below  Abbreviations: NFP - Normal foveal profile. CME - cystoid macular edema. PED - pigment epithelial detachment. IRF - intraretinal fluid. SRF - subretinal fluid.  EZ - ellipsoid zone. ERM - epiretinal membrane. ORA - outer retinal atrophy. ORT - outer retinal tubulation. SRHM - subretinal hyper-reflective material. IRHM - intraretinal hyper-reflective material      Intravitreal Injection, Pharmacologic Agent - OD - Right Eye       Time Out 01/14/2024. 3:53 PM. Confirmed correct patient, procedure, site, and patient consented.   Anesthesia Topical anesthesia was used. Anesthetic medications included Lidocaine 2%, Proparacaine 0.5%.   Procedure Preparation included 5% betadine to ocular surface, eyelid speculum. A supplied (32g) needle was used.   Injection: 1.25 mg Bevacizumab  1.25mg /0.87ml   Route: Intravitreal, Site: Right Eye   NDC: H525437, Lot: [id number], Expiration date: 01/25/2024   Post-op Post injection exam found visual acuity of at least counting fingers. The patient tolerated the procedure well. There were no complications. The patient received written and verbal post procedure care education. Post injection medications were not given.      Intravitreal Injection, Pharmacologic Agent - OS - Left Eye       Time Out 01/14/2024. 3:53 PM. Confirmed correct patient, procedure, site, and patient consented.   Anesthesia Topical anesthesia was used. Anesthetic medications included Lidocaine 2%, Proparacaine 0.5%.   Procedure Preparation included 5% betadine to ocular surface, eyelid speculum. A (32g) needle was used.   Injection: 1.25 mg Bevacizumab  1.25mg /0.72ml   Route:  Intravitreal, Site: Left Eye   NDC: H525437, Lot: [id number], Expiration date: 04/03/2024   Post-op Post injection exam found visual acuity of at least counting fingers. The patient tolerated the procedure well. There were no complications. The patient received written and verbal post procedure care education. Post injection medications were not given.            ASSESSMENT/PLAN:    ICD-10-CM   1. 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 Long term (current) use of oral hypoglycemic drugs  Z79.84     3. Current use of insulin  (HCC)  Z79.4     4. Branch retinal vein occlusion of right eye with macular edema (HCC)  H34.8310     5. Long-term (current) use of injectable non-insulin  antidiabetic drugs  Z79.85     6. Essential hypertension  I10     7. Hypertensive retinopathy of both eyes  H35.033     8. Pseudophakia, both eyes  Z96.1      1-4. Moderate non-proliferative diabetic retinopathy, OU (OD>OS)  - last A1c 6.0 (08.07.25), 8.8 (04.15.24), 8.7 (08.03.23) - s/p IVA OD #1 (12.31.21), #2 (02.04.22), #3 (03.04.22), #4 (04.01.22), #5 (05.05.22), #6 (07.15.22), #7 (11.09.22), #8 (12.21.22), #9 (02.01.23), #10 (03.01.23), #11 (04.05.23), #12 (05.10.23), #13 (06.14.23) #14 (08.13.2025) #15 (09.12.25) - s/p IVA OS #1 (01.04.22), #2 (02.04.22), #3 (03.04.22), #4 (04.01.22), #5 (05.05.22), #6 (07.15.22), #7 (11.09.22), #8 (12.21.22), #9 (02.01.23), #10 (03.01.23), #11 (04.05.23), #12 (05.10.23) #13 (08.13.25) #14 (09.12.25)  **IVA resistance OU** - s/p IVE OS #1 (06.14.23), #2 (07.12.23), #3 (08.09.23), #4 (09.12.23), #5 (10.17.23), #6 (11.21.23), #7  (01.02.24), #8 (02.20.24), #9 (04.09.24), #10 (06.04.24), #11 (07.30.24) - s/p IVE OD #1 (07.12.23), #2 (08.09.23), #3 (09.12.23), #4 (10.17.23), #5 (12.21.23), #6 (01.02.24), #7 (02.20.24), #8 (04.09.24), #9 (06.04.24), #  10 (07.30.24) **Pt delayed from f/u 1+ year (07.30.24-08.13.25) due to illness/hospice** - exam shows scattered MA/DBH OU, central edema OU improving - BCVA OD 20/40 - stable, OS 20/50 from 20/40  - OCT shows OD: OD: Interval increase in IRF/edema greataest superior fovea and macula, OS: Interval increase in IRF/IRHM nasal macula and fovea 5+ wks - recommend IVA OD #16 and IVA OS #15 today, 10.22.25 w/ f/u in 4 wks -- [employer] funding unavailable - pt wishes to proceed w/ injections  - RBA of procedure discussed, questions answered - Avastin  informed consent obtained and re-signed on 08.13.25 (OU)  - Eylea  informed consent obtained and signed on 07.29.24 (OU) - see procedure note - f/u in 4 weeks, DFE, OCT, possible injections  5. BRVO w/ CME OD  - s/p IVA OD x14 and IVE as above - BCVA OD 20/40 - OCT OD shows Interval improvement in IRF/edema greataest nasal macula - recommend IVA OD today as above - f/u in 4 wks -- DFE/OCT/possible injection  6,7. Hypertensive retinopathy OU - discussed importance of tight BP control - monitor  8. Pseudophakia OU  - s/p CE/IOL OU (Dr. Cleatus)  - IOLs in good position, doing well - monitor  Ophthalmic Meds Ordered this visit:  Meds ordered this encounter  Medications   Bevacizumab  (AVASTIN ) SOLN 1.25 mg   Bevacizumab  (AVASTIN ) SOLN 1.25 mg     Return in about 4 weeks (around 02/11/2024) for f/u, NPDR, DFE, OCT, Possible, IVA, OU.  There are no Patient Instructions on file for this visit.  This document serves as a record of services personally performed by Redell JUDITHANN Hans, MD, PhD. It was created on their behalf by Almetta Pesa, an ophthalmic technician. The creation of this record is the provider's dictation and/or  activities during the visit.    Electronically signed by: Almetta Pesa, OA, 01/15/24  12:15 AM  This document serves as a record of services personally performed by Redell JUDITHANN Hans, MD, PhD. It was created on their behalf by Wanda GEANNIE Keens, COT an ophthalmic technician. The creation of this record is the provider's dictation and/or activities during the visit.    Electronically signed by:  Wanda GEANNIE Keens, COT  01/15/24 12:15 AM  Redell JUDITHANN Hans, M.D., Ph.D. Diseases & Surgery of the Retina and Vitreous [facility] 01/14/2024   I have reviewed the above documentation for accuracy and completeness, and I agree with the above. Redell JUDITHANN Hans, M.D., Ph.D. 01/15/24 12:16 AM   Abbreviations: M myopia (nearsighted); A astigmatism; H hyperopia (farsighted); P presbyopia; Mrx spectacle prescription;  CTL contact lenses; OD right eye; OS left eye; OU both eyes  XT exotropia; ET esotropia; PEK punctate epithelial keratitis; PEE punctate epithelial erosions; DES dry eye syndrome; MGD meibomian gland dysfunction; ATs artificial tears; PFAT's preservative free artificial tears; NSC nuclear sclerotic cataract; PSC posterior subcapsular cataract; ERM epi-retinal membrane; PVD posterior vitreous detachment; RD retinal detachment; DM diabetes mellitus; DR diabetic retinopathy; NPDR non-proliferative diabetic retinopathy; PDR proliferative diabetic retinopathy; CSME clinically significant macular edema; DME diabetic macular edema; dbh dot blot hemorrhages; CWS cotton wool spot; POAG primary open angle glaucoma; C/D cup-to-disc ratio; HVF humphrey visual field; GVF goldmann visual field; OCT optical coherence tomography; IOP intraocular pressure; BRVO Branch retinal vein occlusion; CRVO central retinal vein occlusion; CRAO central retinal artery occlusion; BRAO branch retinal artery occlusion; RT retinal tear; SB scleral buckle; PPV pars plana vitrectomy; VH Vitreous hemorrhage; PRP  panretinal laser photocoagulation; IVK intravitreal  kenalog; VMT vitreomacular traction; MH Macular hole;  NVD neovascularization of the disc; NVE neovascularization elsewhere; AREDS age related eye disease study; ARMD age related macular degeneration; POAG primary open angle glaucoma; EBMD epithelial/anterior basement membrane dystrophy; ACIOL anterior chamber intraocular lens; IOL intraocular lens; PCIOL posterior chamber intraocular lens; Phaco/IOL phacoemulsification with intraocular lens placement; PRK photorefractive keratectomy; LASIK laser assisted in situ keratomileusis; HTN hypertension; DM diabetes mellitus; COPD chronic obstructive pulmonary disease

## 2024-01-14 ENCOUNTER — Ambulatory Visit (INDEPENDENT_AMBULATORY_CARE_PROVIDER_SITE_OTHER): Admitting: Ophthalmology

## 2024-01-14 ENCOUNTER — Encounter (INDEPENDENT_AMBULATORY_CARE_PROVIDER_SITE_OTHER): Payer: Self-pay | Admitting: Ophthalmology

## 2024-01-14 DIAGNOSIS — I10 Essential (primary) hypertension: Secondary | ICD-10-CM

## 2024-01-14 DIAGNOSIS — Z961 Presence of intraocular lens: Secondary | ICD-10-CM

## 2024-01-14 DIAGNOSIS — Z7984 Long term (current) use of oral hypoglycemic drugs: Secondary | ICD-10-CM | POA: Diagnosis not present

## 2024-01-14 DIAGNOSIS — H35033 Hypertensive retinopathy, bilateral: Secondary | ICD-10-CM

## 2024-01-14 DIAGNOSIS — E113313 Type 2 diabetes mellitus with moderate nonproliferative diabetic retinopathy with macular edema, bilateral: Secondary | ICD-10-CM

## 2024-01-14 DIAGNOSIS — Z7985 Long-term (current) use of injectable non-insulin antidiabetic drugs: Secondary | ICD-10-CM | POA: Diagnosis not present

## 2024-01-14 DIAGNOSIS — H34831 Tributary (branch) retinal vein occlusion, right eye, with macular edema: Secondary | ICD-10-CM

## 2024-01-14 DIAGNOSIS — Z794 Long term (current) use of insulin: Secondary | ICD-10-CM | POA: Diagnosis not present

## 2024-01-15 ENCOUNTER — Encounter (INDEPENDENT_AMBULATORY_CARE_PROVIDER_SITE_OTHER): Payer: Self-pay | Admitting: Ophthalmology

## 2024-01-15 MED ORDER — BEVACIZUMAB CHEMO INJECTION 1.25MG/0.05ML SYRINGE FOR KALEIDOSCOPE
1.2500 mg | INTRAVITREAL | Status: AC | PRN
  Administered 2024-01-15: 1.25 mg via INTRAVITREAL

## 2024-02-04 NOTE — Progress Notes (Signed)
 [facility] - Clinic Note  02/11/2024     CHIEF COMPLAINT Patient presents for Retina Follow Up   HISTORY OF PRESENT ILLNESS: Thomas Finley is a 68 y.o. male who presents to the clinic today for:   HPI     Retina Follow Up   Patient presents with  Diabetic Retinopathy.  In both eyes.  This started 4 weeks ago.  Duration of 4 weeks.  Since onset it is stable.  I, the attending physician,  performed the HPI with the patient and updated documentation appropriately.        Comments   4 week retina follow up NPDR OU and IVA OU pt is reporting no vision changes noticed according to care taker pt poor historian last reading 123 this am       Last edited by Valdemar Rogue, MD on 02/11/2024  9:25 PM.    Pt states [facility] is the same. Pt is having a colonoscopy and EGD 12.1.25   Referring physician: No referring provider defined for this encounter.  HISTORICAL INFORMATION:   Selected notes from the MEDICAL RECORD NUMBER Referred by Dr. Lamarr Burkitt for concern of CRVO   CURRENT MEDICATIONS: No current outpatient medications on file. (Ophthalmic Drugs)   No current facility-administered medications for this visit. (Ophthalmic Drugs)   Current Outpatient Medications (Other)  Medication Sig   atorvastatin  (LIPITOR) 10 MG tablet Take 10 mg by mouth.   buPROPion  (WELLBUTRIN  XL) 300 MG 24 hr tablet Take 300 mg by mouth daily.   FLUoxetine (PROZAC) 40 MG capsule Take 40 mg by mouth daily.   insulin  glargine, 1 Unit Dial, (TOUJEO SOLOSTAR) 300 UNIT/ML Solostar Pen Inject into the skin.   insulin  glulisine (APIDRA) 100 UNIT/ML injection Inject 100 Units into the skin 3 (three) times daily before meals.   lisinopril (ZESTRIL) 2.5 MG tablet Take 2.5 mg by mouth daily.   LORazepam  (ATIVAN ) 1 MG tablet Take 1 tablet (1 mg total) by mouth every 8 (eight) hours as needed for anxiety.   ondansetron  (ZOFRAN -ODT) 4 MG disintegrating tablet Take 1 tablet (4 mg total) by mouth  every 8 (eight) hours as needed for nausea or vomiting.   OXcarbazepine (TRILEPTAL) 150 MG tablet Take 150 mg by mouth 2 (two) times daily.   pantoprazole  (PROTONIX ) 40 MG tablet Take 40 mg by mouth daily.   Prucalopride Succinate 2 MG TABS Take by mouth.   valbenazine (INGREZZA) 40 MG capsule Take 40 mg by mouth daily.   No current facility-administered medications for this visit. (Other)   REVIEW OF SYSTEMS: ROS   Positive for: Neurological, Genitourinary, Endocrine, Cardiovascular, Eyes Negative for: Constitutional, Gastrointestinal, Skin, Musculoskeletal, HENT, Respiratory, Psychiatric, Allergic/Imm, Heme/Lymph Last edited by Resa Delon ORN, COT on 02/11/2024  2:52 PM.     ALLERGIES Allergies  Allergen Reactions   Insulin  Glargine-Lixisenatide Nausea And Vomiting   PAST MEDICAL HISTORY Past Medical History:  Diagnosis Date   Diabetes (HCC)    Diabetic retinopathy (HCC)    NPDR OU   Hypertension    Hypertensive retinopathy    OU   Past Surgical History:  Procedure Laterality Date   CATARACT EXTRACTION Bilateral    Dr. Burkitt   EYE SURGERY Bilateral    Cat Sx - Dr. Burkitt   FAMILY HISTORY Family History  Problem Relation Age of Onset   Diabetes Father    Diabetes Sister    Diabetes Brother    SOCIAL HISTORY Social History   Tobacco Use  Smoking status: Never   Smokeless tobacco: Never  Vaping Use   Vaping status: Never Used  Substance Use Topics   Alcohol use: Never   Drug use: Never       OPHTHALMIC EXAM: Base Eye Exam     Visual Acuity (Snellen - Linear)       Right Left   Dist [place] 20/40 20/50 -3   Dist ph [place] NI NI         Tonometry (Tonopen, 2:57 PM)       Right Left   Pressure 18 17         Pupils       Pupils Dark Light Shape React APD   Right PERRL 2 1 Round Brisk None   Left PERRL 2 1 Round Brisk None         Visual Fields       Left Right    Full Full         Extraocular Movement       Right Left     Full, Ortho Full, Ortho         Neuro/Psych     Oriented x3: Yes   Mood/Affect: Normal         Dilation     Both eyes: 2.5% Phenylephrine @ 2:57 PM           Slit Lamp and Fundus Exam     Slit Lamp Exam       Right Left   Lids/Lashes Dermatochalasis - upper lid Dermatochalasis - upper lid   Conjunctiva/Sclera White and quiet White and quiet   Cornea 2+ Punctate epithelial erosions, well healed temporal cataract wounds trace Punctate epithelial erosions, mild arcus, well healed temporal cataract wounds   Anterior Chamber deep, clear, narrow temporal angle Deep and quiet   Iris Round and dilated, No NVI Round and dilated, No NVI   Lens PC IOL in good position PC IOL in good position   Anterior Vitreous Vitreous syneresis Vitreous syneresis         Fundus Exam       Right Left   Disc mild Pallor, Sharp rim, no NVD Pink and Sharp   C/D Ratio 0.2 0.3   Macula good foveal reflex, scattered MA/DBH; persistent edema, no exudates, scattered CWS good foveal reflex, scattered IRH, +cystic changes/edema--slightly improved, +CWS--improved, no exudates   Vessels attenuated, Tortuous attenuated, Tortuous   Periphery Attached, scattered IRH/DBH/CWS greatest posteriorly Attached, scattered MA/DBH/CWS greatest posteriorly           IMAGING AND PROCEDURES  Imaging and Procedures for 02/11/2024  OCT, Retina - OU - Both Eyes       Right Eye Quality was good. Central Foveal Thickness: 365. Progression has worsened. Findings include no SRF, abnormal foveal contour, intraretinal hyper-reflective material, intraretinal fluid, outer retinal atrophy, vitreomacular adhesion (Persistent IRF/edema greatest superior fovea and macula--slightly increased).   Left Eye Quality was good. Central Foveal Thickness: 484. Progression has improved. Findings include no SRF, abnormal foveal contour, intraretinal hyper-reflective material, intraretinal fluid, vitreomacular adhesion (Persistent  IRF/IRHM nasal and inferior macula and fovea--slightly improved).   Notes *Images captured and stored on drive  Diagnosis / Impression:  DME OU OD: Persistent IRF/edema greatest superior fovea and macula--slightly increased OS: Persistent IRF/IRHM nasal and inferior macula and fovea--slightly improved  Clinical management:  See below  Abbreviations: NFP - Normal foveal profile. CME - cystoid macular edema. PED - pigment epithelial detachment. IRF - intraretinal fluid. SRF - subretinal fluid.  EZ - ellipsoid zone. ERM - epiretinal membrane. ORA - outer retinal atrophy. ORT - outer retinal tubulation. SRHM - subretinal hyper-reflective material. IRHM - intraretinal hyper-reflective material      Intravitreal Injection, Pharmacologic Agent - OD - Right Eye       Time Out 02/11/2024. 3:27 PM. Confirmed correct patient, procedure, site, and patient consented.   Anesthesia Topical anesthesia was used. Anesthetic medications included Lidocaine 2%, Proparacaine 0.5%.   Procedure Preparation included 5% betadine to ocular surface, eyelid speculum. A (32g) needle was used.   Injection: 1.25 mg Bevacizumab  1.25mg /0.49ml   Route: Intravitreal, Site: Right Eye   NDC: H525437, Lot: [id number], Expiration date: 04/29/2024   Post-op Post injection exam found visual acuity of at least counting fingers. The patient tolerated the procedure well. There were no complications. The patient received written and verbal post procedure care education. Post injection medications were not given.      Intravitreal Injection, Pharmacologic Agent - OS - Left Eye       Time Out 02/11/2024. 3:27 PM. Confirmed correct patient, procedure, site, and patient consented.   Anesthesia Topical anesthesia was used. Anesthetic medications included Lidocaine 2%, Proparacaine 0.5%.   Procedure Preparation included 5% betadine to ocular surface, eyelid speculum. A (32g) needle was used.   Injection: 1.25 mg  Bevacizumab  1.25mg /0.45ml   Route: Intravitreal, Site: Left Eye   NDC: H525437, Lot: [id number], Expiration date: 05/15/2024   Post-op Post injection exam found visual acuity of at least counting fingers. The patient tolerated the procedure well. There were no complications. The patient received written and verbal post procedure care education. Post injection medications were not given.            ASSESSMENT/PLAN:    ICD-10-CM   1. 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 Long term (current) use of oral hypoglycemic drugs  Z79.84     3. Current use of insulin  (HCC)  Z79.4     4. Branch retinal vein occlusion of right eye with macular edema (HCC)  H34.8310     5. Long-term (current) use of injectable non-insulin  antidiabetic drugs  Z79.85     6. Essential hypertension  I10     7. Hypertensive retinopathy of both eyes  H35.033     8. Pseudophakia, both eyes  Z96.1      1-4. Moderate non-proliferative diabetic retinopathy, OU (OD>OS)  - last A1c 6.0 (08.07.25), 8.8 (04.15.24), 8.7 (08.03.23) - s/p IVA OD #1 (12.31.21), #2 (02.04.22), #3 (03.04.22), #4 (04.01.22), #5 (05.05.22), #6 (07.15.22), #7 (11.09.22), #8 (12.21.22), #9 (02.01.23), #10 (03.01.23), #11 (04.05.23), #12 (05.10.23), #13 (06.14.23) #14 (08.13.2025) #15 (09.12.25), #16 (10.22.25) - s/p IVA OS #1 (01.04.22), #2 (02.04.22), #3 (03.04.22), #4 (04.01.22), #5 (05.05.22), #6 (07.15.22), #7 (11.09.22), #8 (12.21.22), #9 (02.01.23), #10 (03.01.23), #11 (04.05.23), #12 (05.10.23) #13 (08.13.25) #14 (09.12.25), #15 (10.22.25)  **IVA resistance OU**  ============================= - s/p IVE OS #1  (06.14.23), #2 (07.12.23), #3 (08.09.23), #4 (09.12.23), #5 (10.17.23), #6 (11.21.23), #7 (01.02.24), #8 (02.20.24), #9 (04.09.24), #10 (06.04.24), #11 (07.30.24) - s/p IVE OD #1 (07.12.23), #2 (08.09.23), #3 (09.12.23), #4 (10.17.23), #5 (12.21.23), #6 (01.02.24), #7 (  02.20.24), #8 (04.09.24), #9 (06.04.24), #10 (07.30.24) **Pt delayed from f/u 1+ year (07.30.24-08.13.25) due to illness/hospice** - exam shows scattered MA/DBH OU, central edema OU improving - BCVA OD 20/40--stable, OS 20/50--stable  - OCT shows OD: Persistent IRF/edema greatest superior fovea and macula--slightly increased, OS: Persistent IRF/IRHM nasal and inferior macula and fovea--slightly improved 4 weeks - recommend IVA OD #17 and IVA OS #16 today, 11.19.25 w/ f/u in 4 wks -- [employer] funding unavailable - pt wishes to proceed w/ injections  - RBA of procedure discussed, questions answered - Avastin  informed consent obtained and re-signed on 08.13.25 (OU)  - Eylea  informed consent obtained and signed on 07.29.24 (OU) - see procedure note - f/u in 4 weeks, DFE, OCT, possible injections  5. BRVO w/ CME OD  - s/p IVA OD x14 and IVE as above - BCVA OD 20/40 - OCT OD shows Interval improvement in IRF/edema greataest nasal macula - recommend IVA OD today as above - f/u in 4 wks -- DFE/OCT/possible injection  6,7. Hypertensive retinopathy OU - discussed importance of tight BP control - monitor  8. Pseudophakia OU  - s/p CE/IOL OU (Dr. Cleatus)  - IOLs in good position, doing well - monitor  Ophthalmic Meds Ordered this visit:  Meds ordered this encounter  Medications   Bevacizumab  (AVASTIN ) SOLN 1.25 mg   Bevacizumab  (AVASTIN ) SOLN 1.25 mg     Return in about 4 weeks (around 03/10/2024) for NPDR OU, DFE, OCT, Possible Injxn.  There are no Patient Instructions on file for this visit.  This document serves as a record of services personally performed by Redell JUDITHANN Hans, MD, PhD. It was created on their behalf by  Almetta Pesa, an ophthalmic technician. The creation of this record is the provider's dictation and/or activities during the visit.    Electronically signed by: Almetta Pesa, OA, 02/11/24  9:29 PM  This document serves as a record of services personally performed by Redell JUDITHANN Hans, MD, PhD. It was created on their behalf by Wanda GEANNIE Keens, COT an ophthalmic technician. The creation of this record is the provider's dictation and/or activities during the visit.    Electronically signed by:  Wanda GEANNIE Keens, COT  02/11/24 9:29 PM  Redell JUDITHANN Hans, M.D., Ph.D. Diseases & Surgery of the Retina and Vitreous [facility]  I have reviewed the above documentation for accuracy and completeness, and I agree with the above. Redell JUDITHANN Hans, M.D., Ph.D. 02/11/24 9:31 PM   Abbreviations: M myopia (nearsighted); A astigmatism; H hyperopia (farsighted); P presbyopia; Mrx spectacle prescription;  CTL contact lenses; OD right eye; OS left eye; OU both eyes  XT exotropia; ET esotropia; PEK punctate epithelial keratitis; PEE punctate epithelial erosions; DES dry eye syndrome; MGD meibomian gland dysfunction; ATs artificial tears; PFAT's preservative free artificial tears; NSC nuclear sclerotic cataract; PSC posterior subcapsular cataract; ERM epi-retinal membrane; PVD posterior vitreous detachment; RD retinal detachment; DM diabetes mellitus; DR diabetic retinopathy; NPDR non-proliferative diabetic retinopathy; PDR proliferative diabetic retinopathy; CSME clinically significant macular edema; DME diabetic macular edema; dbh dot blot hemorrhages; CWS cotton wool spot; POAG primary open angle glaucoma; C/D cup-to-disc ratio; HVF humphrey visual field; GVF goldmann visual field; OCT optical coherence tomography; IOP intraocular pressure; BRVO Branch retinal vein occlusion; CRVO central retinal vein occlusion; CRAO central retinal artery occlusion; BRAO branch retinal artery occlusion;  RT retinal tear; SB scleral buckle; PPV pars plana vitrectomy; VH Vitreous hemorrhage; PRP panretinal laser photocoagulation; IVK intravitreal kenalog; VMT vitreomacular  traction; MH Macular hole;  NVD neovascularization of the disc; NVE neovascularization elsewhere; AREDS age related eye disease study; ARMD age related macular degeneration; POAG primary open angle glaucoma; EBMD epithelial/anterior basement membrane dystrophy; ACIOL anterior chamber intraocular lens; IOL intraocular lens; PCIOL posterior chamber intraocular lens; Phaco/IOL phacoemulsification with intraocular lens placement; PRK photorefractive keratectomy; LASIK laser assisted in situ keratomileusis; HTN hypertension; DM diabetes mellitus; COPD chronic obstructive pulmonary disease

## 2024-02-11 ENCOUNTER — Ambulatory Visit (INDEPENDENT_AMBULATORY_CARE_PROVIDER_SITE_OTHER): Admitting: Ophthalmology

## 2024-02-11 ENCOUNTER — Encounter (INDEPENDENT_AMBULATORY_CARE_PROVIDER_SITE_OTHER): Payer: Self-pay | Admitting: Ophthalmology

## 2024-02-11 DIAGNOSIS — Z794 Long term (current) use of insulin: Secondary | ICD-10-CM

## 2024-02-11 DIAGNOSIS — I10 Essential (primary) hypertension: Secondary | ICD-10-CM

## 2024-02-11 DIAGNOSIS — Z7985 Long-term (current) use of injectable non-insulin antidiabetic drugs: Secondary | ICD-10-CM | POA: Diagnosis not present

## 2024-02-11 DIAGNOSIS — Z7984 Long term (current) use of oral hypoglycemic drugs: Secondary | ICD-10-CM | POA: Diagnosis not present

## 2024-02-11 DIAGNOSIS — H34831 Tributary (branch) retinal vein occlusion, right eye, with macular edema: Secondary | ICD-10-CM

## 2024-02-11 DIAGNOSIS — E113313 Type 2 diabetes mellitus with moderate nonproliferative diabetic retinopathy with macular edema, bilateral: Secondary | ICD-10-CM | POA: Diagnosis not present

## 2024-02-11 DIAGNOSIS — Z961 Presence of intraocular lens: Secondary | ICD-10-CM

## 2024-02-11 DIAGNOSIS — H35033 Hypertensive retinopathy, bilateral: Secondary | ICD-10-CM

## 2024-02-11 MED ORDER — BEVACIZUMAB CHEMO INJECTION 1.25MG/0.05ML SYRINGE FOR KALEIDOSCOPE
1.2500 mg | INTRAVITREAL | Status: AC | PRN
  Administered 2024-02-11: 1.25 mg via INTRAVITREAL

## 2024-03-10 NOTE — Progress Notes (Shared)
 [facility] - Clinic Note  03/11/2024     CHIEF COMPLAINT Patient presents for Retina Follow Up   HISTORY OF PRESENT ILLNESS: Thomas Finley is a 68 y.o. male who presents to the clinic today for:   HPI     Retina Follow Up   Patient presents with  Diabetic Retinopathy.  In both eyes.  Severity is moderate.  Duration of 4 weeks.  Since onset it is stable.        Comments   4 week Retina eval. Patient states vision seems the same. Blood sugar 144      Last edited by German Olam BRAVO, COT on 03/11/2024  8:08 AM.     Pt states VA seems the same, having elevated blood sugars due to diet.    Referring physician: No referring provider defined for this encounter.  HISTORICAL INFORMATION:   Selected notes from the MEDICAL RECORD NUMBER Referred by Dr. Lamarr Burkitt for concern of CRVO   CURRENT MEDICATIONS: No current outpatient medications on file. (Ophthalmic Drugs)   No current facility-administered medications for this visit. (Ophthalmic Drugs)   Current Outpatient Medications (Other)  Medication Sig   atorvastatin  (LIPITOR) 10 MG tablet Take 10 mg by mouth.   buPROPion  (WELLBUTRIN  XL) 300 MG 24 hr tablet Take 300 mg by mouth daily.   FLUoxetine (PROZAC) 40 MG capsule Take 40 mg by mouth daily.   insulin  glargine, 1 Unit Dial, (TOUJEO SOLOSTAR) 300 UNIT/ML Solostar Pen Inject into the skin.   insulin  glulisine (APIDRA) 100 UNIT/ML injection Inject 100 Units into the skin 3 (three) times daily before meals.   lisinopril (ZESTRIL) 2.5 MG tablet Take 2.5 mg by mouth daily.   LORazepam  (ATIVAN ) 1 MG tablet Take 1 tablet (1 mg total) by mouth every 8 (eight) hours as needed for anxiety.   ondansetron  (ZOFRAN -ODT) 4 MG disintegrating tablet Take 1 tablet (4 mg total) by mouth every 8 (eight) hours as needed for nausea or vomiting.   OXcarbazepine (TRILEPTAL) 150 MG tablet Take 150 mg by mouth 2 (two) times daily.   pantoprazole  (PROTONIX ) 40 MG tablet  Take 40 mg by mouth daily.   Prucalopride Succinate 2 MG TABS Take by mouth.   valbenazine (INGREZZA) 40 MG capsule Take 40 mg by mouth daily.   No current facility-administered medications for this visit. (Other)   REVIEW OF SYSTEMS: ROS   Positive for: Neurological, Genitourinary, Endocrine, Cardiovascular, Eyes Negative for: Constitutional, Gastrointestinal, Skin, Musculoskeletal, HENT, Respiratory, Psychiatric, Allergic/Imm, Heme/Lymph Last edited by German Olam BRAVO, COT on 03/11/2024  7:59 AM.      ALLERGIES Allergies  Allergen Reactions   Insulin  Glargine-Lixisenatide Nausea And Vomiting   PAST MEDICAL HISTORY Past Medical History:  Diagnosis Date   Diabetes (HCC)    Diabetic retinopathy (HCC)    NPDR OU   Hypertension    Hypertensive retinopathy    OU   Past Surgical History:  Procedure Laterality Date   CATARACT EXTRACTION Bilateral    Dr. Burkitt   EYE SURGERY Bilateral    Cat Sx - Dr. Burkitt   FAMILY HISTORY Family History  Problem Relation Age of Onset   Diabetes Father    Diabetes Sister    Diabetes Brother    SOCIAL HISTORY Social History   Tobacco Use   Smoking status: Never   Smokeless tobacco: Never  Vaping Use   Vaping status: Never Used  Substance Use Topics   Alcohol use: Never   Drug use: Never  OPHTHALMIC EXAM: Base Eye Exam     Visual Acuity (Snellen - Linear)       Right Left   Dist [place] 20/50 +2 20/40   Dist ph [place] 20/NI 20/NI         Tonometry (Tonopen, 8:07 AM)       Right Left   Pressure 10 12         Pupils       Dark Light Shape React APD   Right 3 2 Round Brisk None   Left 3 2 Round Brisk None         Visual Fields (Counting fingers)       Left Right    Full Full         Extraocular Movement       Right Left    Full, Ortho Full, Ortho         Neuro/Psych     Oriented x3: Yes   Mood/Affect: Normal         Dilation     Both eyes: 1.0% Mydriacyl, 2.5% Phenylephrine @ 8:07 AM            Slit Lamp and Fundus Exam     Slit Lamp Exam       Right Left   Lids/Lashes Dermatochalasis - upper lid Dermatochalasis - upper lid   Conjunctiva/Sclera White and quiet White and quiet   Cornea 2+ Punctate epithelial erosions, well healed temporal cataract wounds trace Punctate epithelial erosions, mild arcus, well healed temporal cataract wounds   Anterior Chamber deep, clear, narrow temporal angle Deep and quiet   Iris Round and dilated, No NVI Round and dilated, No NVI   Lens PC IOL in good position PC IOL in good position   Anterior Vitreous Vitreous syneresis Vitreous syneresis         Fundus Exam       Right Left   Disc mild Pallor, Sharp rim, no NVD Pink and Sharp   C/D Ratio 0.2 0.3   Macula good foveal reflex, scattered MA/DBH; persistent edema, no exudates, scattered CWS good foveal reflex, scattered IRH, +cystic changes/edema--slightly improved, +CWS--improved, no exudates   Vessels attenuated, Tortuous attenuated, Tortuous   Periphery Attached, scattered IRH/DBH/CWS greatest posteriorly Attached, scattered MA/DBH/CWS greatest posteriorly           IMAGING AND PROCEDURES  Imaging and Procedures for 03/11/2024  OCT, Retina - OU - Both Eyes       Right Eye Quality was good. Central Foveal Thickness: 491. Progression has worsened. Findings include no SRF, abnormal foveal contour, intraretinal hyper-reflective material, intraretinal fluid, outer retinal atrophy, vitreomacular adhesion (Persistent IRF/edema greatest superior fovea and macula--slightly increased).   Left Eye Quality was good. Central Foveal Thickness: 454. Progression has improved. Findings include no SRF, abnormal foveal contour, intraretinal hyper-reflective material, intraretinal fluid, vitreomacular adhesion (Persistent IRF/IRHM nasal and inferior macula and fovea--slightly improved).   Notes *Images captured and stored on drive  Diagnosis / Impression:  DME OU OD: Persistent  IRF/edema greatest superior fovea and macula--slightly increased OS: Persistent IRF/IRHM nasal and inferior macula and fovea--slightly improved  Clinical management:  See below  Abbreviations: NFP - Normal foveal profile. CME - cystoid macular edema. PED - pigment epithelial detachment. IRF - intraretinal fluid. SRF - subretinal fluid. EZ - ellipsoid zone. ERM - epiretinal membrane. ORA - outer retinal atrophy. ORT - outer retinal tubulation. SRHM - subretinal hyper-reflective material. IRHM - intraretinal hyper-reflective material      Intravitreal Injection, Pharmacologic  Agent - OD - Right Eye       Time Out 03/11/2024. 9:10 AM. Confirmed correct patient, procedure, site, and patient consented.   Anesthesia Topical anesthesia was used. Anesthetic medications included Lidocaine 2%, Proparacaine 0.5%.   Procedure Preparation included 5% betadine to ocular surface, eyelid speculum. A (32g) needle was used.   Injection: 1.25 mg Bevacizumab  1.25mg /0.51ml   Route: Intravitreal, Site: Right Eye   NDC: C2662926, Lot: [id number], Expiration date: 04/02/2024   Post-op Post injection exam found visual acuity of at least counting fingers. The patient tolerated the procedure well. There were no complications. The patient received written and verbal post procedure care education. Post injection medications were not given.      Intravitreal Injection, Pharmacologic Agent - OS - Left Eye       Time Out 03/11/2024. 9:10 AM. Confirmed correct patient, procedure, site, and patient consented.   Anesthesia Topical anesthesia was used. Anesthetic medications included Lidocaine 2%, Proparacaine 0.5%.   Procedure Preparation included 5% betadine to ocular surface, eyelid speculum. A (32g) needle was used.   Injection: 1.25 mg Bevacizumab  1.25mg /0.15ml   Route: Intravitreal, Site: Left Eye   NDC: C2662926, Lot: [id number], Expiration date: 06/10/2024   Post-op Post injection exam found  visual acuity of at least counting fingers. The patient tolerated the procedure well. There were no complications. The patient received written and verbal post procedure care education. Post injection medications were not given.             ASSESSMENT/PLAN:    ICD-10-CM   1. 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 Long term (current) use of oral hypoglycemic drugs  Z79.84     3. Current use of insulin  (HCC)  Z79.4     4. Branch retinal vein occlusion of right eye with macular edema (HCC)  H34.8310     5. Long-term (current) use of injectable non-insulin  antidiabetic drugs  Z79.85     6. Essential hypertension  I10     7. Hypertensive retinopathy of both eyes  H35.033     8. Pseudophakia, both eyes  Z96.1       1-4. Moderate non-proliferative diabetic retinopathy, OU (OD>OS)  - last A1c 6.0 (08.07.25), 8.8 (04.15.24), 8.7 (08.03.23) - s/p IVA OD #1 (12.31.21), #2 (02.04.22), #3 (03.04.22), #4 (04.01.22), #5 (05.05.22), #6 (07.15.22), #7 (11.09.22), #8 (12.21.22), #9 (02.01.23), #10 (03.01.23), #11 (04.05.23), #12 (05.10.23), #13 (06.14.23) #14 (08.13.2025) #15 (09.12.25), #16 (10.22.25), #17 (11.19.25) - s/p IVA OS #1 (01.04.22), #2 (02.04.22), #3 (03.04.22), #4 (04.01.22), #5 (05.05.22), #6 (07.15.22), #7 (11.09.22), #8 (12.21.22), #9 (02.01.23), #10 (03.01.23), #11 (04.05.23), #12 (05.10.23) #13 (08.13.25) #14 (09.12.25), #15 (10.22.25)  **IVA resistance OU**, #16 (11.19.25)  ============================= - s/p IVE OS #1 (06.14.23), #2 (07.12.23), #3 (08.09.23), #4 (09.12.23), #5 (10.17.23), #6 (11.21.23), #7 (01.02.24), #8 (02.20.24), #9 (04.09.24), #10 (06.04.24), #11 (07.30.24) - s/p IVE OD #1 (07.12.23), #2 (08.09.23),  #3 (09.12.23), #4 (10.17.23), #5 (12.21.23), #6 (01.02.24), #7 (02.20.24), #8 (04.09.24), #9 (06.04.24), #10 (07.30.24) **Pt delayed from f/u 1+ year (07.30.24-08.13.25) due to illness/hospice** - exam shows scattered MA/DBH OU, central edema OU improving - BCVA OD 20/50 down from 20/40, OS improved to 20/40 from 20/50  - OCT shows OD: Persistent  IRF/edema greatest superior fovea and macula--slightly increased, OS: Persistent IRF/IRHM nasal and inferior macula and fovea--slightly improved 4 weeks - recommend IVA OD #18 and IVA OS #15 today, 12.18.25 w/ f/u in 4 wks -- [employer] funding unavailable - pt wishes to proceed w/ injections  - RBA of procedure discussed, questions answered - Avastin  informed consent obtained and re-signed on 08.13.25 (OU)  - Eylea  informed consent obtained and signed on 07.29.24 (OU) - see procedure note - f/u in 4 weeks, DFE, OCT, possible injections  5. BRVO w/ CME OD  - s/p IVA OD x14 and IVE as above - BCVA OD 20/40 - OCT OD shows Interval improvement in IRF/edema greataest nasal macula - recommend IVA OD today as above - f/u in 4 weeks DFE, OCT, possible injection  6,7. Hypertensive retinopathy OU - discussed importance of tight BP control - monitor  8. Pseudophakia OU  - s/p CE/IOL OU (Dr. Cleatus)  - IOLs in good position, doing well - monitor  Ophthalmic Meds Ordered this visit:  No orders of the defined types were placed in this encounter.    Return in about 29 days (around 04/09/2024) for 4 wk f/u NPDR OU, DFE, OCT.  There are no Patient Instructions on file for this visit.  This document serves as a record of services personally performed by Redell JUDITHANN Hans, MD, PhD. It was created on their behalf by Auston Muzzy, COMT. The creation of this record is the provider's dictation and/or activities during the visit.  Electronically signed by: Auston Muzzy, COMT 03/11/2024 9:11 AM  This document serves as a record of services personally performed  by Redell JUDITHANN Hans, MD, PhD. It was created on their behalf by Almetta Pesa, an ophthalmic technician. The creation of this record is the provider's dictation and/or activities during the visit.    Electronically signed by: Almetta Pesa, OA, 03/11/2024  9:11 AM   Redell JUDITHANN Hans, M.D., Ph.D. Diseases & Surgery of the Retina and Vitreous [facility]    Abbreviations: M myopia (nearsighted); A astigmatism; H hyperopia (farsighted); P presbyopia; Mrx spectacle prescription;  CTL contact lenses; OD right eye; OS left eye; OU both eyes  XT exotropia; ET esotropia; PEK punctate epithelial keratitis; PEE punctate epithelial erosions; DES dry eye syndrome; MGD meibomian gland dysfunction; ATs artificial tears; PFAT's preservative free artificial tears; NSC nuclear sclerotic cataract; PSC posterior subcapsular cataract; ERM epi-retinal membrane; PVD posterior vitreous detachment; RD retinal detachment; DM diabetes mellitus; DR diabetic retinopathy; NPDR non-proliferative diabetic retinopathy; PDR proliferative diabetic retinopathy; CSME clinically significant macular edema; DME diabetic macular edema; dbh dot blot hemorrhages; CWS cotton wool spot; POAG primary open angle glaucoma; C/D cup-to-disc ratio; HVF humphrey visual field; GVF goldmann visual field; OCT optical coherence tomography; IOP intraocular pressure; BRVO Branch retinal vein occlusion; CRVO central retinal vein occlusion; CRAO central retinal artery occlusion; BRAO branch retinal artery occlusion; RT retinal tear; SB scleral buckle; PPV pars plana vitrectomy; VH Vitreous hemorrhage; PRP panretinal laser photocoagulation; IVK intravitreal kenalog; VMT vitreomacular traction; MH Macular hole;  NVD neovascularization of the disc; NVE neovascularization elsewhere; AREDS age related eye disease study; ARMD age related macular degeneration; POAG primary open angle glaucoma; EBMD epithelial/anterior basement membrane  dystrophy; ACIOL anterior chamber intraocular lens; IOL intraocular lens; PCIOL posterior chamber intraocular lens; Phaco/IOL phacoemulsification with intraocular lens placement; PRK photorefractive keratectomy; LASIK laser assisted in situ keratomileusis; HTN hypertension; DM diabetes mellitus; COPD chronic obstructive pulmonary disease

## 2024-03-11 ENCOUNTER — Ambulatory Visit (INDEPENDENT_AMBULATORY_CARE_PROVIDER_SITE_OTHER): Admitting: Ophthalmology

## 2024-03-11 ENCOUNTER — Encounter (INDEPENDENT_AMBULATORY_CARE_PROVIDER_SITE_OTHER): Payer: Self-pay | Admitting: Ophthalmology

## 2024-03-11 DIAGNOSIS — Z7985 Long-term (current) use of injectable non-insulin antidiabetic drugs: Secondary | ICD-10-CM

## 2024-03-11 DIAGNOSIS — H34831 Tributary (branch) retinal vein occlusion, right eye, with macular edema: Secondary | ICD-10-CM

## 2024-03-11 DIAGNOSIS — E113313 Type 2 diabetes mellitus with moderate nonproliferative diabetic retinopathy with macular edema, bilateral: Secondary | ICD-10-CM | POA: Diagnosis not present

## 2024-03-11 DIAGNOSIS — Z794 Long term (current) use of insulin: Secondary | ICD-10-CM | POA: Diagnosis not present

## 2024-03-11 DIAGNOSIS — Z961 Presence of intraocular lens: Secondary | ICD-10-CM

## 2024-03-11 DIAGNOSIS — I10 Essential (primary) hypertension: Secondary | ICD-10-CM | POA: Diagnosis not present

## 2024-03-11 DIAGNOSIS — Z7984 Long term (current) use of oral hypoglycemic drugs: Secondary | ICD-10-CM | POA: Diagnosis not present

## 2024-03-11 DIAGNOSIS — H35033 Hypertensive retinopathy, bilateral: Secondary | ICD-10-CM

## 2024-03-11 MED ADMIN — Bevacizumab IV Soln 100 MG/4ML (For Infusion): 1.25 mg | INTRAVITREAL | @ 16:00:00 | NDC 50242006001

## 2024-04-14 NOTE — Progress Notes (Signed)
 " [facility] - Clinic Note  04/16/2024     CHIEF COMPLAINT Patient presents for Retina Follow Up   HISTORY OF PRESENT ILLNESS: Thomas Finley is a 69 y.o. male who presents to the clinic today for:   HPI     Retina Follow Up   Patient presents with  Diabetic Retinopathy.  In both eyes.  This started 4 weeks ago.  Duration of 4 weeks.  Since onset it is stable.  I, the attending physician,  performed the HPI with the patient and updated documentation appropriately.        Comments   4 week retina follow up NPDR and IVA OU Pt is reporting no vision changes noticed he denies any flashes or floaters pt last reading 122 this am       Last edited by Valdemar Rogue, MD on 04/16/2024  4:48 PM.    Pt states     Referring physician: No referring provider defined for this encounter.  HISTORICAL INFORMATION:   Selected notes from the MEDICAL RECORD NUMBER Referred by Dr. Lamarr Burkitt for concern of CRVO   CURRENT MEDICATIONS: No current outpatient medications on file. (Ophthalmic Drugs)   No current facility-administered medications for this visit. (Ophthalmic Drugs)   Current Outpatient Medications (Other)  Medication Sig   atorvastatin  (LIPITOR) 10 MG tablet Take 10 mg by mouth.   buPROPion  (WELLBUTRIN  XL) 300 MG 24 hr tablet Take 300 mg by mouth daily.   FLUoxetine (PROZAC) 40 MG capsule Take 40 mg by mouth daily.   insulin  glargine, 1 Unit Dial, (TOUJEO SOLOSTAR) 300 UNIT/ML Solostar Pen Inject into the skin.   insulin  glulisine (APIDRA) 100 UNIT/ML injection Inject 100 Units into the skin 3 (three) times daily before meals.   lisinopril (ZESTRIL) 2.5 MG tablet Take 2.5 mg by mouth daily.   LORazepam  (ATIVAN ) 1 MG tablet Take 1 tablet (1 mg total) by mouth every 8 (eight) hours as needed for anxiety.   ondansetron  (ZOFRAN -ODT) 4 MG disintegrating tablet Take 1 tablet (4 mg total) by mouth every 8 (eight) hours as needed for nausea or vomiting.    OXcarbazepine (TRILEPTAL) 150 MG tablet Take 150 mg by mouth 2 (two) times daily.   pantoprazole  (PROTONIX ) 40 MG tablet Take 40 mg by mouth daily.   Prucalopride Succinate 2 MG TABS Take by mouth.   valbenazine (INGREZZA) 40 MG capsule Take 40 mg by mouth daily.   No current facility-administered medications for this visit. (Other)   REVIEW OF SYSTEMS: ROS   Positive for: Neurological, Genitourinary, Endocrine, Cardiovascular, Eyes Negative for: Constitutional, Gastrointestinal, Skin, Musculoskeletal, HENT, Respiratory, Psychiatric, Allergic/Imm, Heme/Lymph Last edited by Resa Delon ORN, COT on 04/16/2024  2:21 PM.       ALLERGIES Allergies  Allergen Reactions   Insulin  Glargine-Lixisenatide Nausea And Vomiting   PAST MEDICAL HISTORY Past Medical History:  Diagnosis Date   Diabetes (HCC)    Diabetic retinopathy (HCC)    NPDR OU   Hypertension    Hypertensive retinopathy    OU   Past Surgical History:  Procedure Laterality Date   CATARACT EXTRACTION Bilateral    Dr. Burkitt   EYE SURGERY Bilateral    Cat Sx - Dr. Burkitt   FAMILY HISTORY Family History  Problem Relation Age of Onset   Diabetes Father    Diabetes Sister    Diabetes Brother    SOCIAL HISTORY Social History   Tobacco Use   Smoking status: Never   Smokeless tobacco:  Never  Vaping Use   Vaping status: Never Used  Substance Use Topics   Alcohol use: Never   Drug use: Never       OPHTHALMIC EXAM: Base Eye Exam     Visual Acuity (Snellen - Linear)       Right Left   Dist [place] 20/60 -2 20/40 -2   Dist ph  NI NI         Tonometry (Tonopen, 2:26 PM)       Right Left   Pressure 18 18         Pupils       Pupils Dark Light Shape React APD   Right PERRL 3 2 Round Brisk None   Left PERRL 3 2 Round Brisk None         Visual Fields       Left Right    Full Full         Extraocular Movement       Right Left    Full, Ortho Full, Ortho         Neuro/Psych      Oriented x3: Yes   Mood/Affect: Normal         Dilation     Both eyes: 2.5% Phenylephrine @ 2:26 PM           Slit Lamp and Fundus Exam     Slit Lamp Exam       Right Left   Lids/Lashes Dermatochalasis - upper lid Dermatochalasis - upper lid   Conjunctiva/Sclera White and quiet White and quiet   Cornea 2+ Punctate epithelial erosions, well healed temporal cataract wounds trace Punctate epithelial erosions, mild arcus, well healed temporal cataract wounds   Anterior Chamber deep, clear, narrow temporal angle Deep and quiet   Iris Round and dilated, No NVI Round and dilated, No NVI   Lens PC IOL in good position PC IOL in good position   Anterior Vitreous Vitreous syneresis Vitreous syneresis         Fundus Exam       Right Left   Disc mild Pallor, Sharp rim, no NVD Pink and Sharp   C/D Ratio 0.2 0.3   Macula good foveal reflex, scattered MA/DBH; persistent edema--slightly increased, no exudates, scattered CWS good foveal reflex, scattered IRH, +cystic changes/edema--slightly improved, +CWS--improved, no exudates   Vessels attenuated, Tortuous attenuated, Tortuous   Periphery Attached, scattered IRH/DBH/CWS greatest posteriorly Attached, scattered MA/DBH/CWS greatest posteriorly           IMAGING AND PROCEDURES  Imaging and Procedures for 04/16/2024  OCT, Retina - OU - Both Eyes       Right Eye Quality was good. Central Foveal Thickness: 483. Progression has worsened. Findings include no SRF, abnormal foveal contour, intraretinal hyper-reflective material, intraretinal fluid, outer retinal atrophy, vitreomacular adhesion (Persistent IRF/edema greatest superior fovea and macula--slightly increased).   Left Eye Quality was good. Central Foveal Thickness: 319. Progression has improved. Findings include no SRF, abnormal foveal contour, intraretinal hyper-reflective material, intraretinal fluid, vitreomacular adhesion (Persistent IRF/IRHM nasal and inferior macula and  fovea--improved).   Notes *Images captured and stored on drive  Diagnosis / Impression:  DME OU OD: Persistent IRF/edema greatest superior fovea and macula--slightly increased OS: Persistent IRF/IRHM nasal and inferior macula and fovea--improved  Clinical management:  See below  Abbreviations: NFP - Normal foveal profile. CME - cystoid macular edema. PED - pigment epithelial detachment. IRF - intraretinal fluid. SRF - subretinal fluid. EZ - ellipsoid zone. ERM - epiretinal  membrane. ORA - outer retinal atrophy. ORT - outer retinal tubulation. SRHM - subretinal hyper-reflective material. IRHM - intraretinal hyper-reflective material      Intravitreal Injection, Pharmacologic Agent - OD - Right Eye       Time Out 04/16/2024. 2:42 PM. Confirmed correct patient, procedure, site, and patient consented.   Anesthesia Topical anesthesia was used. Anesthetic medications included Lidocaine 2%, Proparacaine 0.5%.   Procedure Preparation included 5% betadine to ocular surface, eyelid speculum. A supplied (32g) needle was used.   Injection: 1.25 mg Bevacizumab  1.25mg /0.20ml   Route: Intravitreal, Site: Right Eye   NDC: 49757-939-98, Lot: [id number]$$[id number]$ , Expiration date: 05/17/2024   Post-op Post injection exam found visual acuity of at least counting fingers. The patient tolerated the procedure well. There were no complications. The patient received written and verbal post procedure care education. Post injection medications were not given.      Intravitreal Injection, Pharmacologic Agent - OS - Left Eye       Time Out 04/16/2024. 2:43 PM. Confirmed correct patient, procedure, site, and patient consented.   Anesthesia Topical anesthesia was used. Anesthetic medications included Lidocaine 2%, Proparacaine 0.5%.   Procedure Preparation included 5% betadine to ocular surface, eyelid speculum. A supplied (32g) needle was used.   Injection: 1.25 mg Bevacizumab  1.25mg /0.55ml   Route:  Intravitreal, Site: Left Eye   NDC: [id number], Lot: [id number], Expiration date: 05/13/2024   Post-op Post injection exam found visual acuity of at least counting fingers. The patient tolerated the procedure well. There were no complications. The patient received written and verbal post procedure care education. Post injection medications were not given.            ASSESSMENT/PLAN:    ICD-10-CM   1. 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 Long term (current) use of oral hypoglycemic drugs  Z79.84     3. Current use of insulin  (HCC)  Z79.4     4. Branch retinal vein occlusion of right eye with macular edema (HCC)  H34.8310     5. Long-term (current) use of injectable non-insulin  antidiabetic drugs  Z79.85     6. Essential hypertension  I10     7. Hypertensive retinopathy of both eyes  H35.033     8. Pseudophakia, both eyes  Z96.1      1-4. Moderate non-proliferative diabetic retinopathy, OU (OD>OS)  - last A1c 6.0 (08.07.25), 8.8 (04.15.24), 8.7 (08.03.23) - s/p IVA OD #1 (12.31.21), #2 (02.04.22), #3 (03.04.22), #4 (04.01.22), #5 (05.05.22), #6 (07.15.22), #7 (11.09.22), #8 (12.21.22), #9 (02.01.23), #10 (03.01.23), #11 (04.05.23), #12 (05.10.23), #13 (06.14.23) #14 (08.13.2025) #15 (09.12.25), #16 (10.22.25), #17 (11.19.25) #18(12.18.25) - s/p IVA OS #1 (01.04.22), #2 (02.04.22), #3 (03.04.22), #4 (04.01.22), #5 (05.05.22), #6 (07.15.22), #7 (11.09.22), #8 (12.21.22), #9 (02.01.23), #10 (03.01.23), #11 (04.05.23), #12 (05.10.23) #13 (08.13.25) #14 (09.12.25), #15 (10.22.25) #16(12.18.25)  **IVA resistance OU**, #16  (11.19.25)  ============================= - s/p IVE OS #1 (06.14.23), #2 (07.12.23), #3 (08.09.23), #4 (09.12.23), #5 (10.17.23), #6 (11.21.23), #7 (01.02.24), #8 (02.20.24), #9 (04.09.24), #10 (06.04.24), #11 (07.30.24) - s/p IVE OD #1 (07.12.23), #2 (08.09.23), #3 (09.12.23), #4 (10.17.23), #5 (12.21.23), #6 (01.02.24), #  7 (02.20.24), #8 (04.09.24), #9 (06.04.24), #10 (07.30.24) **Pt delayed from f/u 1+ year (07.30.24-08.13.25) due to illness/hospice** - exam shows scattered MA/DBH OU, central edema OU improving - BCVA OD 20/60 from 20/50, OS 20/40 stable  - OCT shows OD: Persistent IRF/edema greatest superior fovea and macula--slightly increased, OS: Persistent IRF/IRHM nasal and inferior macula and fovea--improved at 5 weeks - recommend IVA OD #19 and IVA OS #16 today, 01.23.26 w/ f/u in 4 wks -- [employer] funding unavailable - pt wishes to proceed w/ injections  - RBA of procedure discussed, questions answered - Avastin  informed consent obtained and re-signed on 08.13.25 (OU)  - Eylea  informed consent obtained and signed on 07.29.24 (OU) - see procedure note - f/u in 4 weeks, DFE, OCT, possible injections  5. BRVO w/ CME OD  - s/p IVA OD x14 and IVE as above - BCVA OD 20/60 - OCT OD shows Interval improvement in IRF/edema greataest nasal macula - recommend IVA OD today as above - f/u in 4 weeks DFE, OCT, possible injection  6,7. Hypertensive retinopathy OU - discussed importance of tight BP control - monitor  8. Pseudophakia OU  - s/p CE/IOL OU (Dr. Cleatus)  - IOLs in good position, doing well - monitor  Ophthalmic Meds Ordered this visit:  Meds ordered this encounter  Medications   Bevacizumab  (AVASTIN ) SOLN 1.25 mg   Bevacizumab  (AVASTIN ) SOLN 1.25 mg     Return in about 4 weeks (around 05/14/2024) for NPDR OU, DFE, OCT, likely IVA OU.  There are no Patient Instructions on file for this visit.  This document serves as a record of services personally performed by Redell JUDITHANN Hans, MD, PhD. It was created on their behalf by Delon Newness COT, an ophthalmic technician. The creation of this record is the provider's dictation and/or activities during the visit.    Electronically signed by: Delon Newness COT 01.21.26 4:51 PM  This document serves as a record of services personally performed by Redell JUDITHANN Hans, MD, PhD. It was created on their behalf by Almetta Pesa, an ophthalmic technician. The creation of this record is the provider's dictation and/or activities during the visit.    Electronically signed by: Almetta Pesa, OA, 04/16/24  4:51 PM  Redell JUDITHANN Hans, M.D., Ph.D. Diseases & Surgery of the Retina and Vitreous [facility] 04/16/2024   I have reviewed the above documentation for accuracy and completeness, and I agree with the above. Redell JUDITHANN Hans, M.D., Ph.D. 04/16/24 5:03 PM    Abbreviations: M myopia (nearsighted); A astigmatism; H hyperopia (farsighted); P presbyopia; Mrx spectacle prescription;  CTL contact lenses; OD right eye; OS left eye; OU both eyes  XT exotropia; ET esotropia; PEK punctate epithelial keratitis; PEE punctate epithelial erosions; DES dry eye syndrome; MGD meibomian gland dysfunction; ATs artificial tears; PFAT's preservative free artificial tears; NSC nuclear sclerotic cataract; PSC posterior subcapsular cataract; ERM epi-retinal membrane; PVD posterior vitreous detachment; RD retinal detachment; DM diabetes mellitus; DR diabetic retinopathy; NPDR non-proliferative diabetic retinopathy; PDR proliferative diabetic retinopathy; CSME clinically significant macular edema; DME diabetic macular edema; dbh dot blot hemorrhages; CWS cotton wool spot; POAG primary open angle glaucoma; C/D cup-to-disc ratio; HVF humphrey visual field; GVF goldmann visual field; OCT optical coherence tomography; IOP intraocular pressure; BRVO Branch retinal vein occlusion; CRVO central retinal vein occlusion; CRAO  central retinal artery occlusion; BRAO branch retinal artery occlusion; RT retinal tear; SB scleral buckle; PPV pars plana vitrectomy; VH Vitreous hemorrhage; PRP panretinal laser photocoagulation;  IVK intravitreal kenalog; VMT vitreomacular traction; MH Macular hole;  NVD neovascularization of the disc; NVE neovascularization elsewhere; AREDS age related eye disease study; ARMD age related macular degeneration; POAG primary open angle glaucoma; EBMD epithelial/anterior basement membrane dystrophy; ACIOL anterior chamber intraocular lens; IOL intraocular lens; PCIOL posterior chamber intraocular lens; Phaco/IOL phacoemulsification with intraocular lens placement; PRK photorefractive keratectomy; LASIK laser assisted in situ keratomileusis; HTN hypertension; DM diabetes mellitus; COPD chronic obstructive pulmonary disease "

## 2024-04-16 ENCOUNTER — Ambulatory Visit (INDEPENDENT_AMBULATORY_CARE_PROVIDER_SITE_OTHER): Admitting: Ophthalmology

## 2024-04-16 ENCOUNTER — Encounter (INDEPENDENT_AMBULATORY_CARE_PROVIDER_SITE_OTHER): Payer: Self-pay | Admitting: Ophthalmology

## 2024-04-16 DIAGNOSIS — H35033 Hypertensive retinopathy, bilateral: Secondary | ICD-10-CM

## 2024-04-16 DIAGNOSIS — I10 Essential (primary) hypertension: Secondary | ICD-10-CM

## 2024-04-16 DIAGNOSIS — H34831 Tributary (branch) retinal vein occlusion, right eye, with macular edema: Secondary | ICD-10-CM | POA: Diagnosis not present

## 2024-04-16 DIAGNOSIS — Z7984 Long term (current) use of oral hypoglycemic drugs: Secondary | ICD-10-CM | POA: Diagnosis not present

## 2024-04-16 DIAGNOSIS — E113313 Type 2 diabetes mellitus with moderate nonproliferative diabetic retinopathy with macular edema, bilateral: Secondary | ICD-10-CM | POA: Diagnosis not present

## 2024-04-16 DIAGNOSIS — Z961 Presence of intraocular lens: Secondary | ICD-10-CM

## 2024-04-16 DIAGNOSIS — Z794 Long term (current) use of insulin: Secondary | ICD-10-CM | POA: Diagnosis not present

## 2024-04-16 DIAGNOSIS — Z7985 Long-term (current) use of injectable non-insulin antidiabetic drugs: Secondary | ICD-10-CM

## 2024-04-16 MED ORDER — BEVACIZUMAB CHEMO INJECTION 1.25MG/0.05ML SYRINGE FOR KALEIDOSCOPE
1.2500 mg | INTRAVITREAL | Status: AC | PRN
  Administered 2024-04-16: 1.25 mg via INTRAVITREAL

## 2024-05-14 ENCOUNTER — Encounter (INDEPENDENT_AMBULATORY_CARE_PROVIDER_SITE_OTHER): Admitting: Ophthalmology
